# Patient Record
Sex: Female | Born: 1955
Health system: Southern US, Community
[De-identification: ages and names within clinical notes are randomized; demographics above are authoritative.]

## PROBLEM LIST (undated history)

## (undated) DIAGNOSIS — I10 Essential (primary) hypertension: Secondary | ICD-10-CM

## (undated) DIAGNOSIS — I739 Peripheral vascular disease, unspecified: Secondary | ICD-10-CM

## (undated) DIAGNOSIS — K219 Gastro-esophageal reflux disease without esophagitis: Secondary | ICD-10-CM

## (undated) HISTORY — PX: BACK SURGERY: SHX140

## (undated) HISTORY — PX: BREAST EXCISIONAL BIOPSY: SUR124

---

## 2000-04-04 ENCOUNTER — Inpatient Hospital Stay (HOSPITAL_COMMUNITY): Admission: EM | Admit: 2000-04-04 | Discharge: 2000-04-06 | Payer: Self-pay | Admitting: Psychiatry

## 2000-10-17 ENCOUNTER — Emergency Department (HOSPITAL_COMMUNITY): Admission: EM | Admit: 2000-10-17 | Discharge: 2000-10-17 | Payer: Self-pay | Admitting: Emergency Medicine

## 2001-05-06 ENCOUNTER — Emergency Department (HOSPITAL_COMMUNITY): Admission: EM | Admit: 2001-05-06 | Discharge: 2001-05-06 | Payer: Self-pay | Admitting: Emergency Medicine

## 2001-05-06 ENCOUNTER — Encounter: Payer: Self-pay | Admitting: Emergency Medicine

## 2008-02-11 ENCOUNTER — Emergency Department (HOSPITAL_COMMUNITY): Admission: EM | Admit: 2008-02-11 | Discharge: 2008-02-11 | Payer: Self-pay | Admitting: Emergency Medicine

## 2008-03-19 ENCOUNTER — Emergency Department (HOSPITAL_BASED_OUTPATIENT_CLINIC_OR_DEPARTMENT_OTHER): Admission: EM | Admit: 2008-03-19 | Discharge: 2008-03-19 | Payer: Self-pay | Admitting: Emergency Medicine

## 2008-04-12 ENCOUNTER — Ambulatory Visit: Payer: Self-pay | Admitting: Diagnostic Radiology

## 2008-04-12 ENCOUNTER — Emergency Department (HOSPITAL_BASED_OUTPATIENT_CLINIC_OR_DEPARTMENT_OTHER): Admission: EM | Admit: 2008-04-12 | Discharge: 2008-04-12 | Payer: Self-pay | Admitting: Emergency Medicine

## 2008-05-12 ENCOUNTER — Ambulatory Visit: Payer: Self-pay | Admitting: Diagnostic Radiology

## 2008-05-12 ENCOUNTER — Emergency Department (HOSPITAL_BASED_OUTPATIENT_CLINIC_OR_DEPARTMENT_OTHER): Admission: EM | Admit: 2008-05-12 | Discharge: 2008-05-12 | Payer: Self-pay | Admitting: Emergency Medicine

## 2008-09-28 ENCOUNTER — Emergency Department (HOSPITAL_BASED_OUTPATIENT_CLINIC_OR_DEPARTMENT_OTHER): Admission: EM | Admit: 2008-09-28 | Discharge: 2008-09-28 | Payer: Self-pay | Admitting: Emergency Medicine

## 2010-08-05 NOTE — Discharge Summary (Signed)
[facility]  Patient:    Cynthia Hobbs, Cynthia Hobbs                         MRN: [id number] Adm. Date:  [id number] Disc. Date: [id number] Attending:  Marlyn Corporal Fabmy Dictator:   Landry Corporal, N.P.                           Discharge Summary  HISTORY OF PRESENT ILLNESS:  This is a 55 year old married white female, voluntarily admitted to [facility] for psychotic behavior.  Patient presented with auditory and visual hallucinations since day before admission. Patient had taken herself off her Fioricet and Xanax that she had been on for 3 years.  Patient states that she was just tired of taking them.  She states that her husband was telling her that she had been up all night, talking to herself, having irrational behavior, seeing things that were not there. Patient does not really recall if she were sleeping or not.  She states that that was the case, that she had been sleeping poorly, and has actually been up for the past 4 nights.  She was also ill the week prior to admission, having some vomiting and diarrhea.  She reports some anxiety, having some twitchiness especially in her upper extremities.  Her last pain pill and Xanax was the week prior to admission, approximately 7 days ago.  Patient has continued to have auditory and visual hallucinations as well as tactile hallucinations. She feels that someone was doing something to her head last night, doing some kind of procedure.  She denies any homicidal ideation or paranoia.  She does report having some racing thoughts, no mood swings.  Patient has had no inpatient hospitalizations, nor has she had any outpatient therapy.  Patient has been on Serzone 100 mg p.o. b.i.d., Xanax 0.5 mg 2-3 per night for years that she uses for sleep.  PAST MEDICAL HISTORY:  Her primary care physician is Dr. Tiburcio Pea in [place]. Her medical problems:  Back surgery 3 years ago, otherwise no significant problems.  Admission  medications:  Patient has been on Fioricet, Seroquel 100 mg b.i.d.  She has found that to be effective for her depression, but not for sleep, and takes Xanax 2-3 at night, 0.5 mg.  Drug allergies:  No known drug allergies.  LABORATORY DATA:  WBC count was elevated at 17.3, H&H was elevated at 16.0, 51.0, MCVs were 104.5, glucose was 116, BUN was 27, urine pregnancy test was negative, urine drug screen was negative.  Patient had average weight at 134 with her height of 62.5 inches.  MENTAL STATUS EXAMINATION:  Patient is an alert young middle-aged white female.  She is cooperative, casually dressed, good eye contact, and pleasant. Her arms are twitching on occasion during the interview.  Her speech is normal and relevant.  Her mood is pleasant, her affect is appropriate.  Thought processes:  Patient is experiencing positive visual hallucinations.  She is seeing haze and smoke, and tactile hallucinations.  She has felt that people were doing some exam on her head during the night last night after admission. She denies any paranoia or suicidal or homicidal ideations, no flight of ideas.  She is having some racing thoughts.  Cognitively, patient is aware of date and person.  She does feel a little bit confused.  She is unsure if she has slept and talking in her sleep.  Her memory is fair.  ADMISSION DIAGNOSES: Axis I:    1. Substance-abuse induced mood disorder due to opiates and               benzodiazepines.            2. Rule out bipolar. Axis II:   Deferred. Axis III:  Back pain. Axis IV:   Mild, psychosocial stressors. Axis V:    Current is 30, this past year is 70.  HOSPITAL COURSE:  This is a voluntary admission for psychotic behavior. Patient is to contract for safety, will be monitored every 15 minutes.  We will initiate Zyprexa during the daytime and nighttime hours and begin Depakote ER at bedtime.  Dr. Jodi Marble was consulted for medication regime.  Has had a very short  hospital stay.  Patient was taking Fioricet and Xanax for two years and then abruptly stopped her medication, as well as the Serzone.  We suspect that she had some delirium that has cleared.  Depakote was discontinued and it was felt that patient could be managed on an outpatient basis.  Patient was to follow up with Dr. Tiburcio Pea for any problems and call the office for any kind of crisis.  She also was to call Dr. Jodi Marble for any problems, with the phone number provided.  DISCHARGE MEDICATIONS:  Zyprexa 2.5 mg 1 q.h.s. as needed.  There were no other restrictions for activity or diet.  DISCHARGE DIAGNOSES: Axis I:     Substance-induced mood disorder due to opiates and             benzodiazepines. Axis II:    Deferred. Axis III:   Back pain. Axis IV:    Mild psychosocial stressors. Axis V:     Current is 60, the past year is 70. DD:  05/02/00 TD:  05/03/00 Job: [id number]

## 2010-08-05 NOTE — Discharge Summary (Signed)
[facility]  Patient:    Cynthia Hobbs, Cynthia Hobbs                         MRN: [id number] Adm. Date:  [id number] Disc. Date: [id number] Attending:  Marlyn Corporal Fabmy Dictator:   Candi Leash. Orsini, N.P.                           Discharge Summary  HISTORY OF PRESENT ILLNESS:  This is a 55 year old married white female voluntarily admitted for psychotic behavior. The patient presented with auditory visual hallucinations.  Two days  prior to admission she had taken herself off her Fioricet and Xanax that she had been taking for the past three years.  The patient states she was just tired of taking them.  Her husband told that she had been up all night, talking to herself, having irrational behavior, seeing things that were not there. The patient does not really recall if she was sleeping or not.  She does state that that was the case, that she has been sleeping poorly and has had no sleep for the past four nights.  The patient has also been ill... DD:  05/02/00 TD:  05/03/00 Job: [id number]

## 2010-08-05 NOTE — H&P (Signed)
[facility]  Patient:    Cynthia Hobbs, Cynthia Hobbs                         MRN: [id number] Adm. Date:  [id number] Attending:  Marlyn Corporal Fabmy Dictator:   Landry Corporal, N.P.                   Psychiatric Admission Assessment  DATE OF ADMISSION:  April 04, 2000  PATIENT IDENTIFICATION:  This is a 55 year old married white female, voluntarily admitted to [facility] on April 04, 2000, for psychotic behavior.  HISTORY OF PRESENT ILLNESS:  Patient presents with auditory and visual hallucinations since Monday, April 02, 2000.  Patient reports that she had taken herself off her Fioricet and Xanax that she has been on for the past 3 years.  She states that she was just tired of being on them.  Her husband had told her that she has been up all night, talking to herself, having irrational behavior, seeing things that were not there.  Patient does not really recall if she was sleeping or not.  She does that that was the case, that she has been sleeping very poorly and has had no sleep for the past 4 nights.   She also was ill last week, having some vomiting and diarrhea.  She reports some anxiety.  She states that she has been having some twitching, especially in her upper extremities.  Her last pain pill and Xanax was last week, approximately 7 days ago.  Patient continues to have auditory and visual hallucinations, had some tactile hallucinations.  She feels that someone was doing something to her head last night, doing some kind of procedure.  She denies any homicidal ideation, no paranoia.  She does report having some racing thoughts, no mood swings.  PAST PSYCHIATRIC HISTORY:  She has had no inpatient hospitalizations.  She does not see a counselor or therapist.  She has been on Serzone 100 mg p.o. b.i.d. and Xanax 0.5 mg 2-3 per night for years for sleep.  SUBSTANCE ABUSE HISTORY:  She smokes 2 packs a day for 15 years, denies any alcohol or  substance abuse problems.  PAST MEDICAL HISTORY:  Primary care physician is Dr. Tiburcio Pea in [place]. Medical problems:  Back surgery 3 years ago, otherwise no significant medical problems.  Medications:  She has been on Fioricet, Seroquel 100 mg b.i.d.  She has found that effective for her depression but not to help her with her sleep, and she takes Xanax 2-3 at night, 0.5 mg for years for sleep that were prescribed by Dr. Tiburcio Pea.  DRUG ALLERGIES:  No known drug allergies.  PHYSICAL EXAMINATION:  Pending.  We are awaiting results on a UA, urine drug screen, urine pregnancy test, CBC and CMET.  SOCIAL HISTORY:  She is a 55 year old married white female for 14 years.  She has two children, one five and one 38 year old.  She lives with her husband and children.  She works as a Child psychotherapist.  She has completed the 10th grade. She has no financial or legal problems.  FAMILY HISTORY:  She has a grandfather with depression.  MENTAL STATUS EXAMINATION:  She is an alert, young middle aged white female. She is cooperative, casually dressed, good eye contact, pleasant.  Her arms are twitching on occasion during the interview.  Her speech is normal and relevant.  Her mood is pleasant.  Her affect is appropriate.  Thought  processes:  Patient is experiencing positive visual hallucinations.  She is seeing haze and smoke, and tactile hallucinations.  She has felt that people were doing some exam on her head during the night last night after admission. She denies any paranoia.  No suicidal or homicidal ideations.  No flight of ideas.  She is having some racing thoughts at night.  Cognitively, patient is aware of dates and person.  She does feel a little bit confused.  She is unsure if she has slept and talking in her sleep.  Her memory is fair.  ADMISSION DIAGNOSES: Axis I:    1. Substance abuse-induced mood disorder due to opiates and               benzodiazepines.            2. Rule out  bipolar. Axis II:   Deferred. Axis III:  Back pain. Axis IV:   Mild psychosocial stressors. Axis V:    Current is 30, this past year 78.  INITIAL PLAN OF CARE:  Voluntary admission to [facility] for psychotic behavior, contract for safety, check every 15 minutes.  Will initiate Zyprexa during daytime and nighttime hours and Depakote ER 500 mg q.h.s.  We are awaiting laboratory results on UA, urine pregnancy test, urine drug screen, CMET, CBC and thyroid.  Dr. Jodi Marble was consulted for medication regime.  ESTIMATED LENGTH OF STAY:  Four to five days. DD:  04/05/00 TD:  04/05/00 Job: [id number]

## 2011-04-10 ENCOUNTER — Other Ambulatory Visit: Payer: Self-pay | Admitting: Family

## 2011-04-10 DIAGNOSIS — Z1231 Encounter for screening mammogram for malignant neoplasm of breast: Secondary | ICD-10-CM

## 2011-04-26 ENCOUNTER — Ambulatory Visit
Admission: RE | Admit: 2011-04-26 | Discharge: 2011-04-26 | Disposition: A | Discharge status: Home / Self Care | Payer: Self-pay | Source: Ambulatory Visit | Attending: Family | Admitting: Family

## 2011-04-26 DIAGNOSIS — Z1231 Encounter for screening mammogram for malignant neoplasm of breast: Secondary | ICD-10-CM

## 2012-06-15 ENCOUNTER — Emergency Department (HOSPITAL_COMMUNITY)
Admission: EM | Admit: 2012-06-15 | Discharge: 2012-06-15 | Disposition: A | Discharge status: Home / Self Care | Payer: Self-pay | Attending: Emergency Medicine | Admitting: Emergency Medicine

## 2012-06-15 ENCOUNTER — Encounter (HOSPITAL_COMMUNITY): Payer: Self-pay | Admitting: Cardiology

## 2012-06-15 DIAGNOSIS — R220 Localized swelling, mass and lump, head: Secondary | ICD-10-CM | POA: Insufficient documentation

## 2012-06-15 DIAGNOSIS — R221 Localized swelling, mass and lump, neck: Secondary | ICD-10-CM | POA: Insufficient documentation

## 2012-06-15 DIAGNOSIS — Z8719 Personal history of other diseases of the digestive system: Secondary | ICD-10-CM | POA: Insufficient documentation

## 2012-06-15 DIAGNOSIS — F17200 Nicotine dependence, unspecified, uncomplicated: Secondary | ICD-10-CM | POA: Insufficient documentation

## 2012-06-15 HISTORY — DX: Gastro-esophageal reflux disease without esophagitis: K21.9

## 2012-06-15 MED ORDER — TRAMADOL HCL 50 MG PO TABS
50.0000 mg | ORAL_TABLET | Freq: Once | ORAL | Status: AC
  Administered 2012-06-15: 50 mg via ORAL
  Filled 2012-06-15: qty 1

## 2012-06-15 MED ORDER — TRAMADOL HCL 50 MG PO TABS: 50.0000 mg | ORAL_TABLET | Freq: Four times a day (QID) | ORAL | Status: DC | PRN

## 2012-06-15 MED ORDER — METRONIDAZOLE 500 MG PO TABS: 500.0000 mg | ORAL_TABLET | Freq: Two times a day (BID) | ORAL | Status: DC

## 2012-06-15 MED ORDER — METRONIDAZOLE 500 MG PO TABS
500.0000 mg | ORAL_TABLET | Freq: Once | ORAL | Status: AC
  Administered 2012-06-15: 500 mg via ORAL
  Filled 2012-06-15: qty 1

## 2012-06-15 MED ORDER — CEFTRIAXONE SODIUM 1 G IJ SOLR
1.0000 g | Freq: Once | INTRAMUSCULAR | Status: AC
  Administered 2012-06-15: 1 g via INTRAMUSCULAR
  Filled 2012-06-15: qty 10

## 2012-06-15 MED ORDER — PENICILLIN V POTASSIUM 500 MG PO TABS: 500.0000 mg | ORAL_TABLET | Freq: Four times a day (QID) | ORAL | Status: DC

## 2012-06-15 NOTE — ED Provider Notes (Signed)
History     CSN: [id number]  Arrival date & time 06/15/12  1145   First MD Initiated Contact with Patient 06/15/12 1202      Chief Complaint  Patient presents with  . Facial Pain    (Consider location/radiation/quality/duration/timing/severity/associated sxs/prior treatment) HPI  Cynthia Hobbs is a 57 y.o. female complaining of pain and swelling to left cheek starting approximately 24 hours ago. Patient has a scratch just superior to the affected area. She's also been having tooth pain in the maxillary teeth just beneath the swelling. She denies fever, difficulty swallowing her secretions, shortness of breath, change in her vision. Pain is rated as moderate and is exacerbated by jaw movement and palpation.  Past Medical History  Diagnosis Date  . GERD (gastroesophageal reflux disease)     Past Surgical History  Procedure Laterality Date  . Back surgery      History reviewed. No pertinent family history.  History  Substance Use Topics  . Smoking status: Current Every Day Smoker  . Smokeless tobacco: Not on file  . Alcohol Use: No    OB History   Grav Para Term Preterm Abortions TAB SAB Ect Mult Living                  Review of Systems  Allergies  Review of patient's allergies indicates no known allergies.  Home Medications   Current Outpatient Rx  Name  Route  Sig  Dispense  Refill  . naproxen sodium (ANAPROX) 220 MG tablet   Oral   Take 440 mg by mouth 2 (two) times daily with a meal.         . OVER THE COUNTER MEDICATION   Oral   Take 1 capsule by mouth daily. Equate brand of Prilosec           BP 157/93  Pulse 76  Temp(Src) 98.7 F (37.1 C) (Oral)  Resp 18  SpO2 97%  Physical Exam  Nursing note and vitals reviewed. Constitutional: She is oriented to person, place, and time. She appears well-developed and well-nourished. No distress.  HENT:  Head: Normocephalic.    Right Ear: External ear normal.  Left Ear: External ear normal.    Mouth/Throat: Oropharynx is clear and moist.    Generally good dentition, tenderness to palpation in area indicated on diagram. No swelling, erythema or fluctuance appreciated.  No firmness or tenderness to palpation under tongue  Eyes: Conjunctivae and EOM are normal. Pupils are equal, round, and reactive to light.  Neck: Normal range of motion. Neck supple.  Cardiovascular: Normal rate.   Pulmonary/Chest: Effort normal. No stridor.  Musculoskeletal: Normal range of motion.  Lymphadenopathy:    She has cervical adenopathy.  Neurological: She is alert and oriented to person, place, and time.  Psychiatric: She has a normal mood and affect.    ED Course  Procedures (including critical care time)  Labs Reviewed - No data to display No results found.   1. Left facial swelling       MDM    Cynthia Hobbs is a 57 y.o. female with moderate swelling to the right cheek. This does not appear to be a cellulitis associated with the scab directly above the swelling, however with dental pain onset was after the swelling occurred I will treat her for soft tissue infection and also cover for dental infection as well.  Strict return precautions were given and patient verbalized her understanding.   Filed Vitals:   06/15/12 1151  BP: 157/93  Pulse: 76  Temp: 98.7 F (37.1 C)  TempSrc: Oral  Resp: 18  SpO2: 97%     Pt verbalized understanding and agrees with care plan. Outpatient follow-up and return precautions given.    New Prescriptions   METRONIDAZOLE (FLAGYL) 500 MG TABLET    Take 1 tablet (500 mg total) by mouth 2 (two) times daily. One tab PO bid x 10 days   PENICILLIN V POTASSIUM (VEETID) 500 MG TABLET    Take 1 tablet (500 mg total) by mouth 4 (four) times daily.   TRAMADOL (ULTRAM) 50 MG TABLET    Take 1 tablet (50 mg total) by mouth every 6 (six) hours as needed for pain.           Wynetta Emery, PA-C 06/15/12 1255

## 2012-06-15 NOTE — ED Notes (Signed)
Pt reports some swelling and pain to the left side of her upper jaw near her nose. States she has not noticed and sore or problems with her tooth but it is painful to bite down. States increased stress recently also.

## 2012-06-16 NOTE — ED Provider Notes (Signed)
Medical screening examination/treatment/procedure(s) were performed by non-physician practitioner and as supervising physician I was immediately available for consultation/collaboration.    Vida Roller, MD 06/16/12 [phone]

## 2012-08-14 ENCOUNTER — Other Ambulatory Visit: Payer: Self-pay

## 2012-08-14 DIAGNOSIS — Z1231 Encounter for screening mammogram for malignant neoplasm of breast: Secondary | ICD-10-CM

## 2012-09-16 ENCOUNTER — Ambulatory Visit
Admission: RE | Admit: 2012-09-16 | Discharge: 2012-09-16 | Disposition: A | Discharge status: Home / Self Care | Payer: BC Managed Care – PPO | Source: Ambulatory Visit

## 2012-09-16 DIAGNOSIS — Z1231 Encounter for screening mammogram for malignant neoplasm of breast: Secondary | ICD-10-CM

## 2012-09-19 ENCOUNTER — Other Ambulatory Visit: Payer: Self-pay | Admitting: Family

## 2012-09-19 DIAGNOSIS — R928 Other abnormal and inconclusive findings on diagnostic imaging of breast: Secondary | ICD-10-CM

## 2012-10-02 ENCOUNTER — Ambulatory Visit
Admission: RE | Admit: 2012-10-02 | Discharge: 2012-10-02 | Disposition: A | Discharge status: Home / Self Care | Payer: BC Managed Care – PPO | Source: Ambulatory Visit | Attending: Family | Admitting: Family

## 2012-10-02 DIAGNOSIS — R928 Other abnormal and inconclusive findings on diagnostic imaging of breast: Secondary | ICD-10-CM

## 2013-11-25 ENCOUNTER — Other Ambulatory Visit: Payer: Self-pay

## 2013-11-25 DIAGNOSIS — Z1231 Encounter for screening mammogram for malignant neoplasm of breast: Secondary | ICD-10-CM

## 2013-12-03 ENCOUNTER — Ambulatory Visit
Admission: RE | Admit: 2013-12-03 | Discharge: 2013-12-03 | Disposition: A | Discharge status: Home / Self Care | Payer: BC Managed Care – PPO | Source: Ambulatory Visit

## 2013-12-03 DIAGNOSIS — Z1231 Encounter for screening mammogram for malignant neoplasm of breast: Secondary | ICD-10-CM

## 2014-12-02 ENCOUNTER — Other Ambulatory Visit: Payer: Self-pay

## 2014-12-02 DIAGNOSIS — Z1231 Encounter for screening mammogram for malignant neoplasm of breast: Secondary | ICD-10-CM

## 2015-01-06 ENCOUNTER — Ambulatory Visit
Admission: RE | Admit: 2015-01-06 | Discharge: 2015-01-06 | Disposition: A | Discharge status: Home / Self Care | Payer: 59 | Source: Ambulatory Visit

## 2015-01-06 DIAGNOSIS — Z1231 Encounter for screening mammogram for malignant neoplasm of breast: Secondary | ICD-10-CM

## 2016-01-26 ENCOUNTER — Other Ambulatory Visit: Payer: Self-pay | Admitting: Family

## 2016-01-26 DIAGNOSIS — Z1231 Encounter for screening mammogram for malignant neoplasm of breast: Secondary | ICD-10-CM

## 2016-03-01 ENCOUNTER — Ambulatory Visit
Admission: RE | Admit: 2016-03-01 | Discharge: 2016-03-01 | Disposition: A | Discharge status: Home / Self Care | Payer: Medicaid Other | Source: Ambulatory Visit | Attending: Family | Admitting: Family

## 2016-03-01 DIAGNOSIS — Z1231 Encounter for screening mammogram for malignant neoplasm of breast: Secondary | ICD-10-CM

## 2017-01-03 ENCOUNTER — Ambulatory Visit (HOSPITAL_COMMUNITY)
Admission: EM | Admit: 2017-01-03 | Discharge: 2017-01-03 | Disposition: A | Discharge status: Home / Self Care | Payer: BLUE CROSS/BLUE SHIELD | Attending: Family Medicine | Admitting: Family Medicine

## 2017-01-03 ENCOUNTER — Encounter (HOSPITAL_COMMUNITY): Payer: Self-pay | Admitting: Emergency Medicine

## 2017-01-03 DIAGNOSIS — I10 Essential (primary) hypertension: Secondary | ICD-10-CM | POA: Diagnosis not present

## 2017-01-03 MED ORDER — LISINOPRIL-HYDROCHLOROTHIAZIDE 10-12.5 MG PO TABS: 1.0000 | ORAL_TABLET | Freq: Every day | ORAL | 1 refills | Status: DC

## 2017-01-03 NOTE — ED Provider Notes (Signed)
  [facility]   [id number] 01/03/17 Arrival Time: 1433  ASSESSMENT & PLAN:  1. Essential hypertension     Meds ordered this encounter  Medications  . lisinopril-hydrochlorothiazide (PRINZIDE,ZESTORETIC) 10-12.5 MG tablet    Sig: Take 1 tablet by mouth daily.    Dispense:  30 tablet    Refill:  1   Has f/u with PCP in 2 weeks. May f/u here as needed.  Reviewed expectations re: course of current medical issues. Questions answered. Outlined signs and symptoms indicating need for more acute intervention. Patient verbalized understanding. After Visit Summary given.   SUBJECTIVE:  Cynthia Hobbs is a 61 y.o. female who presents with complaint of noticing high blood pressures the past few weeks. Systolics in the 170-180s. No h/o HTN. No symptoms. FH of HTN. Desires to begin medication.  ROS: As per HPI.   OBJECTIVE:  Vitals:   01/03/17 1508  BP: (!) 170/104  Pulse: 77  Resp: 16  Temp: 98.5 F (36.9 C)  TempSrc: Oral  SpO2: 100%    General appearance: alert; no distress Eyes: PERRLA; EOMI; conjunctiva normal Neck: supple Lungs: clear to auscultation bilaterally Heart: regular rate and rhythm Abdomen: soft, non-tender; bowel sounds normal; no masses or organomegaly; no guarding or rebound tenderness Extremities: no cyanosis or edema; symmetrical with no gross deformities Skin: warm and dry Neurologic: normal gait; normal symmetric reflexes Psychological: alert and cooperative; normal mood and affect   No Known Allergies  Past Medical History:  Diagnosis Date  . GERD (gastroesophageal reflux disease)    Social History   Social History  . Marital status: Single    Spouse name: N/A  . Number of children: N/A  . Years of education: N/A   Occupational History  . Not on file.   Social History Main Topics  . Smoking status: Current Every Day Smoker  . Smokeless tobacco: Not on file  . Alcohol use No  . Drug use: No  . Sexual activity: Not on file     Other Topics Concern  . Not on file   Social History Narrative  . No narrative on file   No family history on file. Past Surgical History:  Procedure Laterality Date  . BACK SURGERY       Mardella LaymanHagler, Lailee Hoelzel, MD 01/08/17 [phone]

## 2017-01-03 NOTE — ED Triage Notes (Signed)
Pt states, "i haven't been to the doctor in a while, and I was at the doctors office two weeks ago and it was 180, and i've been checking it for the past few weeks and its always been high around 170s 180s. I dont have a history of high blood pressure and im not having any symptoms right now, sometimes i've had a headache and I take an advil and it goes away. I dont have a headache now."

## 2017-01-03 NOTE — ED Notes (Signed)
Pt now states been taking her mothers blood pressure medicines. Metoprolol 25mg  once a day x2 weeks.

## 2017-01-17 DIAGNOSIS — Z1151 Encounter for screening for human papillomavirus (HPV): Secondary | ICD-10-CM | POA: Diagnosis not present

## 2017-01-17 DIAGNOSIS — R5383 Other fatigue: Secondary | ICD-10-CM | POA: Diagnosis not present

## 2017-01-17 DIAGNOSIS — I10 Essential (primary) hypertension: Secondary | ICD-10-CM | POA: Diagnosis not present

## 2017-01-17 DIAGNOSIS — Z1272 Encounter for screening for malignant neoplasm of vagina: Secondary | ICD-10-CM | POA: Diagnosis not present

## 2017-01-17 DIAGNOSIS — E663 Overweight: Secondary | ICD-10-CM | POA: Diagnosis not present

## 2017-01-17 DIAGNOSIS — Z0000 Encounter for general adult medical examination without abnormal findings: Secondary | ICD-10-CM | POA: Diagnosis not present

## 2017-01-17 DIAGNOSIS — N390 Urinary tract infection, site not specified: Secondary | ICD-10-CM | POA: Diagnosis not present

## 2017-01-17 DIAGNOSIS — Z01419 Encounter for gynecological examination (general) (routine) without abnormal findings: Secondary | ICD-10-CM | POA: Diagnosis not present

## 2017-02-14 DIAGNOSIS — R319 Hematuria, unspecified: Secondary | ICD-10-CM | POA: Diagnosis not present

## 2017-02-14 DIAGNOSIS — N390 Urinary tract infection, site not specified: Secondary | ICD-10-CM | POA: Diagnosis not present

## 2017-02-14 DIAGNOSIS — E559 Vitamin D deficiency, unspecified: Secondary | ICD-10-CM | POA: Diagnosis not present

## 2017-02-14 DIAGNOSIS — I10 Essential (primary) hypertension: Secondary | ICD-10-CM | POA: Diagnosis not present

## 2017-03-07 DIAGNOSIS — Z1211 Encounter for screening for malignant neoplasm of colon: Secondary | ICD-10-CM | POA: Diagnosis not present

## 2017-03-07 DIAGNOSIS — K621 Rectal polyp: Secondary | ICD-10-CM | POA: Diagnosis not present

## 2017-03-16 DIAGNOSIS — K621 Rectal polyp: Secondary | ICD-10-CM | POA: Diagnosis not present

## 2017-03-16 DIAGNOSIS — Z1211 Encounter for screening for malignant neoplasm of colon: Secondary | ICD-10-CM | POA: Diagnosis not present

## 2017-03-21 DIAGNOSIS — E559 Vitamin D deficiency, unspecified: Secondary | ICD-10-CM | POA: Diagnosis not present

## 2017-04-04 ENCOUNTER — Other Ambulatory Visit: Payer: Self-pay | Admitting: Internal Medicine

## 2017-04-04 DIAGNOSIS — Z139 Encounter for screening, unspecified: Secondary | ICD-10-CM

## 2017-04-25 ENCOUNTER — Ambulatory Visit
Admission: RE | Admit: 2017-04-25 | Discharge: 2017-04-25 | Disposition: A | Discharge status: Home / Self Care | Payer: BLUE CROSS/BLUE SHIELD | Source: Ambulatory Visit | Attending: Internal Medicine | Admitting: Internal Medicine

## 2017-04-25 DIAGNOSIS — Z1231 Encounter for screening mammogram for malignant neoplasm of breast: Secondary | ICD-10-CM | POA: Diagnosis not present

## 2017-04-25 DIAGNOSIS — Z139 Encounter for screening, unspecified: Secondary | ICD-10-CM

## 2017-05-16 DIAGNOSIS — E559 Vitamin D deficiency, unspecified: Secondary | ICD-10-CM | POA: Diagnosis not present

## 2017-06-20 DIAGNOSIS — D485 Neoplasm of uncertain behavior of skin: Secondary | ICD-10-CM | POA: Diagnosis not present

## 2017-06-20 DIAGNOSIS — C44319 Basal cell carcinoma of skin of other parts of face: Secondary | ICD-10-CM | POA: Diagnosis not present

## 2017-08-15 DIAGNOSIS — I10 Essential (primary) hypertension: Secondary | ICD-10-CM | POA: Diagnosis not present

## 2017-08-28 DIAGNOSIS — C44319 Basal cell carcinoma of skin of other parts of face: Secondary | ICD-10-CM | POA: Diagnosis not present

## 2018-01-16 DIAGNOSIS — Z0000 Encounter for general adult medical examination without abnormal findings: Secondary | ICD-10-CM | POA: Diagnosis not present

## 2018-01-16 DIAGNOSIS — I10 Essential (primary) hypertension: Secondary | ICD-10-CM | POA: Diagnosis not present

## 2018-01-16 DIAGNOSIS — E559 Vitamin D deficiency, unspecified: Secondary | ICD-10-CM | POA: Diagnosis not present

## 2018-01-23 DIAGNOSIS — Z23 Encounter for immunization: Secondary | ICD-10-CM | POA: Diagnosis not present

## 2018-01-23 DIAGNOSIS — Z0000 Encounter for general adult medical examination without abnormal findings: Secondary | ICD-10-CM | POA: Diagnosis not present

## 2018-01-28 ENCOUNTER — Other Ambulatory Visit: Payer: Self-pay | Admitting: Internal Medicine

## 2018-01-28 DIAGNOSIS — Z720 Tobacco use: Secondary | ICD-10-CM

## 2018-02-06 ENCOUNTER — Ambulatory Visit
Admission: RE | Admit: 2018-02-06 | Discharge: 2018-02-06 | Disposition: A | Discharge status: Home / Self Care | Payer: BLUE CROSS/BLUE SHIELD | Source: Ambulatory Visit | Attending: Internal Medicine | Admitting: Internal Medicine

## 2018-02-06 DIAGNOSIS — F17210 Nicotine dependence, cigarettes, uncomplicated: Secondary | ICD-10-CM | POA: Diagnosis not present

## 2018-02-06 DIAGNOSIS — Z720 Tobacco use: Secondary | ICD-10-CM

## 2018-02-20 DIAGNOSIS — Z720 Tobacco use: Secondary | ICD-10-CM | POA: Diagnosis not present

## 2018-02-20 DIAGNOSIS — I25111 Atherosclerotic heart disease of native coronary artery with angina pectoris with documented spasm: Secondary | ICD-10-CM | POA: Diagnosis not present

## 2018-02-20 DIAGNOSIS — I700 Atherosclerosis of aorta: Secondary | ICD-10-CM | POA: Diagnosis not present

## 2018-03-06 ENCOUNTER — Encounter: Payer: Self-pay | Admitting: Cardiovascular Disease

## 2018-03-06 ENCOUNTER — Ambulatory Visit: Payer: BLUE CROSS/BLUE SHIELD | Admitting: Cardiovascular Disease

## 2018-03-06 VITALS — BP 118/70 | HR 72 | Ht 62.0 in | Wt 142.0 lb

## 2018-03-06 DIAGNOSIS — I2510 Atherosclerotic heart disease of native coronary artery without angina pectoris: Secondary | ICD-10-CM

## 2018-03-06 DIAGNOSIS — I739 Peripheral vascular disease, unspecified: Secondary | ICD-10-CM

## 2018-03-06 DIAGNOSIS — E785 Hyperlipidemia, unspecified: Secondary | ICD-10-CM | POA: Diagnosis not present

## 2018-03-06 DIAGNOSIS — I10 Essential (primary) hypertension: Secondary | ICD-10-CM | POA: Insufficient documentation

## 2018-03-06 DIAGNOSIS — Z720 Tobacco use: Secondary | ICD-10-CM | POA: Insufficient documentation

## 2018-03-06 NOTE — Assessment & Plan Note (Signed)
History of coronary calcification seen on screening chest CT 02/06/2018.  She does have positive risk factors otherwise.  We will get a pharmacologic Myoview stress test to further evaluate.

## 2018-03-06 NOTE — Patient Instructions (Signed)
Medication Instructions:  Your physician recommends that you continue on your current medications as directed. Please refer to the Current Medication list given to you today.  If you need a refill on your cardiac medications before your next appointment, please call your pharmacy.   Lab work: Your physician recommends that you return for lab work in: 2 MONTHS; LIPID/ LIVER  If you have labs (blood work) drawn today and your tests are completely normal, you will receive your results only by: Marland Kitchen. MyChart Message (if you have MyChart) OR . A paper copy in the mail If you have any lab test that is abnormal or we need to change your treatment, we will call you to review the results.  Testing/Procedures: Your physician has requested that you have a lexiscan myoview. For further information please visit [email or web address]. Please follow instruction sheet, as given.  Your physician has requested that you have a lower or upper extremity arterial duplex. This test is an ultrasound of the arteries in the legs or arms. It looks at arterial blood flow in the legs and arms. Allow one hour for Lower and Upper Arterial scans. There are no restrictions or special instructions  Your physician has requested that you have an ankle brachial index (ABI). During this test an ultrasound and blood pressure cuff are used to evaluate the arteries that supply the arms and legs with blood. Allow thirty minutes for this exam. There are no restrictions or special instructions.    Follow-Up: At [facility], you and your health needs are our priority.  As part of our continuing mission to provide you with exceptional heart care, we have created designated Provider Care Teams.  These Care Teams include your primary Cardiologist (physician) and Advanced Practice Providers (APPs -  Physician Assistants and Nurse Practitioners) who all work together to provide you with the care you need, when you need it. You will need a  follow up appointment in 6 months.  Please call our office 2 months in advance to schedule this appointment.  You may see DR. BERRY or one of the following Advanced Practice Providers on your designated Care Team:   Corine ShelterLuke Kilroy, PA-C [place]a Kroeger, [place] JerseyPA-C . Marjie Skiffallie Goodrich, PA-C

## 2018-03-06 NOTE — Assessment & Plan Note (Signed)
History of hyperlipidemia recently started on statin therapy.  We will recheck a lipid liver profile in 3 months

## 2018-03-06 NOTE — Assessment & Plan Note (Signed)
History of right lower extremity claudication.  We will get aortoiliac and lower extremity arterial Doppler studies to further evaluate

## 2018-03-06 NOTE — Assessment & Plan Note (Signed)
History of essential hypertension on amlodipine with blood pressure measured today at 118/70.

## 2018-03-06 NOTE — Progress Notes (Signed)
03/06/2018 Cynthia Hobbs   December 22, 1955  [id number]  Primary Physician Merri Brunette, MD Primary Cardiologist: Runell Gess MD Nicholes Calamity, [place]  HPI:  Cynthia Hobbs is a 62 y.o. thin appearing divorced Caucasian female mother of 2, grandmother of 2 grandchildren who is accompanied by her mother [name] who is also a patient of mine.  She was referred by Dr. Merri Brunette for cardiac evaluation because of coronary calcification found on screening chest CT.  Risk factors include 40 pack years of tobacco use, treated hypertension and hyperlipidemia.  Mother did have carotid artery disease with endarterectomy recently.  She is never had a heart attack or stroke.  She denies chest pain or shortness of breath patient does complain of right lower extremity claudication however.   Current Meds  Medication Sig  . amLODipine (NORVASC) 5 MG tablet Take 5 mg by mouth daily.  . Cholecalciferol (VITAMIN D3) 50 MCG (2000 UT) TABS Take 2,000 Units by mouth daily.  . Ibuprofen (ADVIL PO) Take by mouth.  Marland Kitchen OVER THE COUNTER MEDICATION Take 1 capsule by mouth daily. Equate brand of Prilosec  . rosuvastatin (CRESTOR) 10 MG tablet Take 10 mg by mouth daily.     No Known Allergies  Social History   Socioeconomic History  . Marital status: Single    Spouse name: Not on file  . Number of children: Not on file  . Years of education: Not on file  . Highest education level: Not on file  Occupational History  . Not on file  Social Needs  . Financial resource strain: Not on file  . Food insecurity:    Worry: Not on file    Inability: Not on file  . Transportation needs:    Medical: Not on file    Non-medical: Not on file  Tobacco Use  . Smoking status: Current Every Day Smoker  . Smokeless tobacco: Never Used  Substance and Sexual Activity  . Alcohol use: No  . Drug use: No  . Sexual activity: Not on file  Lifestyle  . Physical activity:    Days per week: Not on file    Minutes per  session: Not on file  . Stress: Not on file  Relationships  . Social connections:    Talks on phone: Not on file    Gets together: Not on file    Attends religious service: Not on file    Active member of club or organization: Not on file    Attends meetings of clubs or organizations: Not on file    Relationship status: Not on file  . Intimate partner violence:    Fear of current or ex partner: Not on file    Emotionally abused: Not on file    Physically abused: Not on file    Forced sexual activity: Not on file  Other Topics Concern  . Not on file  Social History Narrative  . Not on file     Review of Systems: General: negative for chills, fever, night sweats or weight changes.  Cardiovascular: negative for chest pain, dyspnea on exertion, edema, orthopnea, palpitations, paroxysmal nocturnal dyspnea or shortness of breath Dermatological: negative for rash Respiratory: negative for cough or wheezing Urologic: negative for hematuria Abdominal: negative for nausea, vomiting, diarrhea, bright red blood per rectum, melena, or hematemesis Neurologic: negative for visual changes, syncope, or dizziness All other systems reviewed and are otherwise negative except as noted above.    Blood pressure 118/70,  pulse 72, height 5\' 2"  (1.575 m), weight 142 lb (64.4 kg).  General appearance: alert and no distress Neck: no adenopathy, no carotid bruit, no JVD, supple, symmetrical, trachea midline and thyroid not enlarged, symmetric, no tenderness/mass/nodules Lungs: clear to auscultation bilaterally Heart: regular rate and rhythm, S1, S2 normal, no murmur, click, rub or gallop Extremities: extremities normal, atraumatic, no cyanosis or edema Pulses: 2+ and symmetric Skin: Skin color, texture, turgor normal. No rashes or lesions Neurologic: Alert and oriented X 3, normal strength and tone. Normal symmetric reflexes. Normal coordination and gait  EKG sinus rhythm at 72 with left axis deviation  poor R wave progression.  I personally reviewed this EKG.  ASSESSMENT AND PLAN:   Coronary artery calcification seen on CAT scan History of coronary calcification seen on screening chest CT 02/06/2018.  She does have positive risk factors otherwise.  We will get a pharmacologic Myoview stress test to further evaluate.  Hyperlipidemia History of hyperlipidemia recently started on statin therapy.  We will recheck a lipid liver profile in 3 months  Tobacco abuse History of ongoing tobacco abuse recalcitrant to risk factor modification.  Claudication in peripheral vascular disease (HCC) History of right lower extremity claudication.  We will get aortoiliac and lower extremity arterial Doppler studies to further evaluate  Essential hypertension History of essential hypertension on amlodipine with blood pressure measured today at 118/70.      Runell GessJonathan J. Domique Reardon MD FACP,FACC,FAHA, [facility] 03/06/2018 2:40 PM

## 2018-03-06 NOTE — Assessment & Plan Note (Signed)
History of ongoing tobacco abuse recalcitrant to risk factor modification. 

## 2018-03-11 ENCOUNTER — Other Ambulatory Visit: Payer: Self-pay | Admitting: Cardiovascular Disease

## 2018-03-11 DIAGNOSIS — I739 Peripheral vascular disease, unspecified: Secondary | ICD-10-CM

## 2018-03-22 ENCOUNTER — Telehealth (HOSPITAL_COMMUNITY): Payer: Self-pay

## 2018-03-22 NOTE — Telephone Encounter (Signed)
Encounter complete. 

## 2018-03-26 ENCOUNTER — Telehealth (HOSPITAL_COMMUNITY): Payer: Self-pay

## 2018-03-26 NOTE — Telephone Encounter (Signed)
Encounter complete. 

## 2018-03-27 ENCOUNTER — Ambulatory Visit (HOSPITAL_COMMUNITY)
Admission: RE | Admit: 2018-03-27 | Discharge: 2018-03-27 | Disposition: A | Discharge status: Home / Self Care | Payer: BLUE CROSS/BLUE SHIELD | Source: Ambulatory Visit | Attending: Cardiology | Admitting: Cardiology

## 2018-03-27 DIAGNOSIS — I2510 Atherosclerotic heart disease of native coronary artery without angina pectoris: Secondary | ICD-10-CM | POA: Insufficient documentation

## 2018-03-27 LAB — MYOCARDIAL PERFUSION IMAGING
CHL CUP NUCLEAR SSS: 4
LVDIAVOL: 86 mL (ref 46–106)
LVSYSVOL: 43 mL
Peak HR: 115 {beats}/min
Rest HR: 68 {beats}/min
SDS: 4
SRS: 0
TID: 1.38

## 2018-03-27 MED ORDER — TECHNETIUM TC 99M TETROFOSMIN IV KIT
31.6000 | PACK | Freq: Once | INTRAVENOUS | Status: AC | PRN
  Administered 2018-03-27: 31.6 via INTRAVENOUS
  Filled 2018-03-27: qty 32

## 2018-03-27 MED ORDER — TECHNETIUM TC 99M TETROFOSMIN IV KIT
10.5000 | PACK | Freq: Once | INTRAVENOUS | Status: AC | PRN
  Administered 2018-03-27: 10.5 via INTRAVENOUS
  Filled 2018-03-27: qty 11

## 2018-03-27 MED ORDER — REGADENOSON 0.4 MG/5ML IV SOLN
0.4000 mg | Freq: Once | INTRAVENOUS | Status: AC
  Administered 2018-03-27: 0.4 mg via INTRAVENOUS

## 2018-04-03 ENCOUNTER — Encounter (HOSPITAL_COMMUNITY): Payer: BLUE CROSS/BLUE SHIELD

## 2018-04-03 ENCOUNTER — Ambulatory Visit (HOSPITAL_COMMUNITY)
Admission: RE | Admit: 2018-04-03 | Discharge: 2018-04-03 | Disposition: A | Discharge status: Home / Self Care | Payer: BLUE CROSS/BLUE SHIELD | Source: Ambulatory Visit | Attending: Cardiology | Admitting: Cardiology

## 2018-04-03 DIAGNOSIS — I739 Peripheral vascular disease, unspecified: Secondary | ICD-10-CM

## 2018-04-24 ENCOUNTER — Encounter: Payer: Self-pay | Admitting: Cardiovascular Disease

## 2018-04-24 ENCOUNTER — Ambulatory Visit (INDEPENDENT_AMBULATORY_CARE_PROVIDER_SITE_OTHER): Payer: BLUE CROSS/BLUE SHIELD | Admitting: Cardiovascular Disease

## 2018-04-24 DIAGNOSIS — I739 Peripheral vascular disease, unspecified: Secondary | ICD-10-CM

## 2018-04-24 DIAGNOSIS — I10 Essential (primary) hypertension: Secondary | ICD-10-CM | POA: Diagnosis not present

## 2018-04-24 DIAGNOSIS — E782 Mixed hyperlipidemia: Secondary | ICD-10-CM

## 2018-04-24 DIAGNOSIS — Z720 Tobacco use: Secondary | ICD-10-CM

## 2018-04-24 DIAGNOSIS — I2510 Atherosclerotic heart disease of native coronary artery without angina pectoris: Secondary | ICD-10-CM | POA: Diagnosis not present

## 2018-04-24 NOTE — Assessment & Plan Note (Signed)
History of ongoing tobacco abuse with 3 to 4 cigarettes a day.

## 2018-04-24 NOTE — Assessment & Plan Note (Signed)
Ms. Throneberry a chest CT performed 02/06/2018 for screening purposes that showed coronary calcification.  Based on this I performed Myoview stress testing 03/27/2018 which was entirely normal.

## 2018-04-24 NOTE — Assessment & Plan Note (Signed)
History of essential hypertension her blood pressure measured today 122/80.  She is on amlodipine.

## 2018-04-24 NOTE — Assessment & Plan Note (Signed)
She of right calf lifestyle limiting claudication with recent Doppler study performed 04/03/2018 revealing a right ABI 0.78 and a left of 1.01.  She did have a high-frequency signal in her mid right SFA.  Based on this, we decided to proceed with outpatient peripheral angiography and potential endovascular therapy for lifestyle limiting claudication.

## 2018-04-24 NOTE — Addendum Note (Signed)
Addended by: Harlow Asa on: 04/24/2018 03:51 PM   Modules accepted: Orders

## 2018-04-24 NOTE — H&P (View-Only) (Signed)
04/24/2018 Cynthia Hobbs   1955/10/23  [id number]  Primary Physician Merri Brunette, MD Primary Cardiologist: Runell Gess MD Nicholes Calamity, [place]  HPI:  Cynthia Hobbs is a 63 y.o.  thin appearing divorced Caucasian female mother of 2, grandmother of 2 grandchildren who is accompanied by her mother Cynthia Hobbs who is also a patient of mine.  She was referred by Dr. Merri Brunette for cardiac evaluation because of coronary calcification found on screening chest CT. I last saw her in the office 03/06/2018. Risk factors include 40 pack years of tobacco use, treated hypertension and hyperlipidemia.  Mother did have carotid artery disease with endarterectomy recently.  She is never had a heart attack or stroke.  She denies chest pain or shortness of breath patient does complain of right lower extremity claudication however.  Since I saw her she has had a Myoview stress test performed 03/27/2018 which was entirely normal and lower extremity Dopplers performed 04/03/2018 which revealed a right ABI of 0.78 and left of 1.01 with a high-frequency signal in her mid right SFA.  Based on this, she wishes to proceed with outpatient angiography potential endovascular therapy.   Current Meds  Medication Sig  . amLODipine (NORVASC) 5 MG tablet Take 5 mg by mouth daily.  . Cholecalciferol (VITAMIN D3) 50 MCG (2000 UT) TABS Take 2,000 Units by mouth daily.  . Ibuprofen (ADVIL PO) Take by mouth.  Marland Kitchen OVER THE COUNTER MEDICATION Take 1 capsule by mouth daily. Equate brand of Prilosec  . rosuvastatin (CRESTOR) 10 MG tablet Take 10 mg by mouth daily.     No Known Allergies  Social History   Socioeconomic History  . Marital status: Single    Spouse name: Not on file  . Number of children: Not on file  . Years of education: Not on file  . Highest education level: Not on file  Occupational History  . Not on file  Social Needs  . Financial resource strain: Not on file  . Food insecurity:    Worry: Not on  file    Inability: Not on file  . Transportation needs:    Medical: Not on file    Non-medical: Not on file  Tobacco Use  . Smoking status: Current Every Day Smoker  . Smokeless tobacco: Never Used  Substance and Sexual Activity  . Alcohol use: No  . Drug use: No  . Sexual activity: Not on file  Lifestyle  . Physical activity:    Days per week: Not on file    Minutes per session: Not on file  . Stress: Not on file  Relationships  . Social connections:    Talks on phone: Not on file    Gets together: Not on file    Attends religious service: Not on file    Active member of club or organization: Not on file    Attends meetings of clubs or organizations: Not on file    Relationship status: Not on file  . Intimate partner violence:    Fear of current or ex partner: Not on file    Emotionally abused: Not on file    Physically abused: Not on file    Forced sexual activity: Not on file  Other Topics Concern  . Not on file  Social History Narrative  . Not on file     Review of Systems: General: negative for chills, fever, night sweats or weight changes.  Cardiovascular: negative for chest pain, dyspnea  on exertion, edema, orthopnea, palpitations, paroxysmal nocturnal dyspnea or shortness of breath Dermatological: negative for rash Respiratory: negative for cough or wheezing Urologic: negative for hematuria Abdominal: negative for nausea, vomiting, diarrhea, bright red blood per rectum, melena, or hematemesis Neurologic: negative for visual changes, syncope, or dizziness All other systems reviewed and are otherwise negative except as noted above.    Blood pressure 122/80, pulse 78, height 5\' 2"  (1.575 m), weight 145 lb (65.8 kg), SpO2 92 %.  General appearance: alert and no distress Neck: no adenopathy, no carotid bruit, no JVD, supple, symmetrical, trachea midline and thyroid not enlarged, symmetric, no tenderness/mass/nodules Lungs: clear to auscultation  bilaterally Heart: regular rate and rhythm, S1, S2 normal, no murmur, click, rub or gallop Extremities: extremities normal, atraumatic, no cyanosis or edema Pulses: 2+ and symmetric Skin: Skin color, texture, turgor normal. No rashes or lesions Neurologic: Alert and oriented X 3, normal strength and tone. Normal symmetric reflexes. Normal coordination and gait  EKG not performed today  ASSESSMENT AND PLAN:   Coronary artery calcification seen on CAT scan Ms. Danzer a chest CT performed 02/06/2018 for screening purposes that showed coronary calcification.  Based on this I performed Myoview stress testing 03/27/2018 which was entirely normal.  Hyperlipidemia History of hyperlipidemia currently on Crestor followed by his PCP  Tobacco abuse History of ongoing tobacco abuse with 3 to 4 cigarettes a day.  Essential hypertension History of essential hypertension her blood pressure measured today 122/80.  She is on amlodipine.  Claudication in peripheral vascular disease (HCC) She of right calf lifestyle limiting claudication with recent Doppler study performed 04/03/2018 revealing a right ABI 0.78 and a left of 1.01.  She did have a high-frequency signal in her mid right SFA.  Based on this, we decided to proceed with outpatient peripheral angiography and potential endovascular therapy for lifestyle limiting claudication.      Runell Gess MD FACP,FACC,FAHA, [facility] 04/24/2018 2:58 PM

## 2018-04-24 NOTE — Assessment & Plan Note (Signed)
History of hyperlipidemia currently on Crestor followed by his PCP

## 2018-04-24 NOTE — Progress Notes (Signed)
04/24/2018 Cynthia Hobbs   1955/10/23  [id number]  Primary Physician Merri Brunette, MD Primary Cardiologist: Runell Gess MD Nicholes Calam[place], [place]  HPI:  Cynthia Hobbs is a 63 y.o.  thin appearing divorced Caucasian female mother of 2, grandmother of 2 grandchildren who is accompanied by her mother Cynthia Hobbs who is also a patient of mine.  She was referred by Dr. Merri Brunette for cardiac evaluation because of coronary calcification found on screening chest CT. I last saw her in the office 03/06/2018. Risk factors include 40 pack years of tobacco use, treated hypertension and hyperlipidemia.  Mother did have carotid artery disease with endarterectomy recently.  She is never had a heart attack or stroke.  She denies chest pain or shortness of breath patient does complain of right lower extremity claudication however.  Since I saw her she has had a Myoview stress test performed 03/27/2018 which was entirely normal and lower extremity Dopplers performed 04/03/2018 which revealed a right ABI of 0.78 and left of 1.01 with a high-frequency signal in her mid right SFA.  Based on this, she wishes to proceed with outpatient angiography potential endovascular therapy.   Current Meds  Medication Sig  . amLODipine (NORVASC) 5 MG tablet Take 5 mg by mouth daily.  . Cholecalciferol (VITAMIN D3) 50 MCG (2000 UT) TABS Take 2,000 Units by mouth daily.  . Ibuprofen (ADVIL PO) Take by mouth.  Marland Kitchen OVER THE COUNTER MEDICATION Take 1 capsule by mouth daily. Equate brand of Prilosec  . rosuvastatin (CRESTOR) 10 MG tablet Take 10 mg by mouth daily.     No Known Allergies  Social History   Socioeconomic History  . Marital status: Single    Spouse name: Not on file  . Number of children: Not on file  . Years of education: Not on file  . Highest education level: Not on file  Occupational History  . Not on file  Social Needs  . Financial resource strain: Not on file  . Food insecurity:    Worry: Not on  file    Inability: Not on file  . Transportation needs:    Medical: Not on file    Non-medical: Not on file  Tobacco Use  . Smoking status: Current Every Day Smoker  . Smokeless tobacco: Never Used  Substance and Sexual Activity  . Alcohol use: No  . Drug use: No  . Sexual activity: Not on file  Lifestyle  . Physical activity:    Days per week: Not on file    Minutes per session: Not on file  . Stress: Not on file  Relationships  . Social connections:    Talks on phone: Not on file    Gets together: Not on file    Attends religious service: Not on file    Active member of club or organization: Not on file    Attends meetings of clubs or organizations: Not on file    Relationship status: Not on file  . Intimate partner violence:    Fear of current or ex partner: Not on file    Emotionally abused: Not on file    Physically abused: Not on file    Forced sexual activity: Not on file  Other Topics Concern  . Not on file  Social History Narrative  . Not on file     Review of Systems: General: negative for chills, fever, night sweats or weight changes.  Cardiovascular: negative for chest pain, dyspnea  on exertion, edema, orthopnea, palpitations, paroxysmal nocturnal dyspnea or shortness of breath Dermatological: negative for rash Respiratory: negative for cough or wheezing Urologic: negative for hematuria Abdominal: negative for nausea, vomiting, diarrhea, bright red blood per rectum, melena, or hematemesis Neurologic: negative for visual changes, syncope, or dizziness All other systems reviewed and are otherwise negative except as noted above.    Blood pressure 122/80, pulse 78, height 5' 2" (1.575 m), weight 145 lb (65.8 kg), SpO2 92 %.  General appearance: alert and no distress Neck: no adenopathy, no carotid bruit, no JVD, supple, symmetrical, trachea midline and thyroid not enlarged, symmetric, no tenderness/mass/nodules Lungs: clear to auscultation  bilaterally Heart: regular rate and rhythm, S1, S2 normal, no murmur, click, rub or gallop Extremities: extremities normal, atraumatic, no cyanosis or edema Pulses: 2+ and symmetric Skin: Skin color, texture, turgor normal. No rashes or lesions Neurologic: Alert and oriented X 3, normal strength and tone. Normal symmetric reflexes. Normal coordination and gait  EKG not performed today  ASSESSMENT AND PLAN:   Coronary artery calcification seen on CAT scan Ms. Mehlhaff a chest CT performed 02/06/2018 for screening purposes that showed coronary calcification.  Based on this I performed Myoview stress testing 03/27/2018 which was entirely normal.  Hyperlipidemia History of hyperlipidemia currently on Crestor followed by his PCP  Tobacco abuse History of ongoing tobacco abuse with 3 to 4 cigarettes a day.  Essential hypertension History of essential hypertension her blood pressure measured today 122/80.  She is on amlodipine.  Claudication in peripheral vascular disease (HCC) She of right calf lifestyle limiting claudication with recent Doppler study performed 04/03/2018 revealing a right ABI 0.78 and a left of 1.01.  She did have a high-frequency signal in her mid right SFA.  Based on this, we decided to proceed with outpatient peripheral angiography and potential endovascular therapy for lifestyle limiting claudication.      Binnie Vonderhaar J. Paz Fuentes MD FACP,FACC,FAHA, FSCAI 04/24/2018 2:58 PM 

## 2018-04-24 NOTE — Patient Instructions (Signed)
    [facility] CARDIOVASCULAR DIVISION [facility] [address] [place] [address] Dept: [phone] Loc: [phone]  DALI ESTESS  04/24/2018  You are scheduled for a Peripheral Angiogram on Monday, February 10 with Dr. [name].  1. Please arrive at the [facility] (Main Entrance A) at [facility]: [address] [place], [place] [address] at 7:30 AM (This time is two hours before your procedure to ensure your preparation). Free valet parking service is available.   Special note: Every effort is made to have your procedure done on time. Please understand that emergencies sometimes delay scheduled procedures.  2. Diet: Do not eat solid foods after midnight.  The patient may have clear liquids until 5am upon the day of the procedure.  3. Labs: You will need to have blood drawn today: CBC, BMP 4. Medication instructions in preparation for your procedure:   On the morning of your procedure take any morning medicines NOT listed above.  You may use sips of water.  5. Plan for one night stay--bring personal belongings. 6. Bring a current list of your medications and current insurance cards. 7. You MUST have a responsible person to drive you home. 8. Someone MUST be with you the first 24 hours after you arrive home or your discharge will be delayed. 9. Please wear clothes that are easy to get on and off and wear slip-on shoes.  Thank you for allowing [place] to care for you!   -- [facility]   TESTS: Your physician has requested that you have an aorta/iliac duplex. During this test, an ultrasound is used to evaluate blood flow to the aorta and iliac arteries. Allow one hour for this exam. Do not eat after midnight the day before and avoid carbonated beverages.  SCHEDULE 2 WEEKS POST PROCEDURE  FOLLOW UP:  Please schedule a follow up appointment with Dr. Allyson Sabal 3 weeks after your  procedure.

## 2018-04-25 ENCOUNTER — Telehealth: Payer: Self-pay | Admitting: *Deleted

## 2018-04-25 LAB — BASIC METABOLIC PANEL
BUN/Creatinine Ratio: 18 (ref 12–28)
BUN: 15 mg/dL (ref 8–27)
CO2: 22 mmol/L (ref 20–29)
CREATININE: 0.85 mg/dL (ref 0.57–1.00)
Calcium: 9.6 mg/dL (ref 8.7–10.3)
Chloride: 105 mmol/L (ref 96–106)
GFR calc Af Amer: 86 mL/min/{1.73_m2} (ref >59)
GFR calc non Af Amer: 74 mL/min/{1.73_m2} (ref >59)
GLUCOSE: 87 mg/dL (ref 65–99)
Potassium: 4.5 mmol/L (ref 3.5–5.2)
SODIUM: 143 mmol/L (ref 134–144)

## 2018-04-25 LAB — CBC
Hematocrit: 42.3 % (ref 34.0–46.6)
Hemoglobin: 14 g/dL (ref 11.1–15.9)
MCH: 28.9 pg (ref 26.6–33.0)
MCHC: 33.1 g/dL (ref 31.5–35.7)
MCV: 87 fL (ref 79–97)
Platelets: 265 10*3/uL (ref 150–450)
RBC: 4.84 x10E6/uL (ref 3.77–5.28)
RDW: 11.5 % — ABNORMAL LOW (ref 11.7–15.4)
WBC: 9.5 10*3/uL (ref 3.4–10.8)

## 2018-04-25 NOTE — Telephone Encounter (Signed)
Pt contacted pre-PV procedure scheduled at [facility] for: Monday April 29, 2018 9:30 AM Verified arrival time and place: [facility] Main Entrance A at: 7:30 AM  No solid food after midnight prior to cath, clear liquids until 5 AM day of procedure. Contrast allergy: no  AM meds can be  taken pre-cath with sip of water including: ASA 81 mg  Confirmed patient has responsible person to drive home post procedure and observe 24 hours after arriving home: yes

## 2018-04-26 ENCOUNTER — Other Ambulatory Visit: Payer: Self-pay

## 2018-04-26 DIAGNOSIS — I739 Peripheral vascular disease, unspecified: Secondary | ICD-10-CM

## 2018-04-29 ENCOUNTER — Encounter (HOSPITAL_COMMUNITY): Payer: Self-pay | Admitting: Cardiovascular Disease

## 2018-04-29 ENCOUNTER — Other Ambulatory Visit: Payer: Self-pay

## 2018-04-29 ENCOUNTER — Ambulatory Visit (HOSPITAL_COMMUNITY)
Admission: RE | Admit: 2018-04-29 | Discharge: 2018-04-30 | Disposition: A | Discharge status: Home / Self Care | Payer: BLUE CROSS/BLUE SHIELD | Attending: Cardiovascular Disease | Admitting: Cardiovascular Disease

## 2018-04-29 ENCOUNTER — Ambulatory Visit (HOSPITAL_COMMUNITY)
Admission: RE | Disposition: A | Discharge status: Home / Self Care | Payer: Self-pay | Source: Home / Self Care | Attending: Cardiovascular Disease

## 2018-04-29 DIAGNOSIS — Z8249 Family history of ischemic heart disease and other diseases of the circulatory system: Secondary | ICD-10-CM | POA: Insufficient documentation

## 2018-04-29 DIAGNOSIS — F17210 Nicotine dependence, cigarettes, uncomplicated: Secondary | ICD-10-CM | POA: Insufficient documentation

## 2018-04-29 DIAGNOSIS — E785 Hyperlipidemia, unspecified: Secondary | ICD-10-CM | POA: Diagnosis not present

## 2018-04-29 DIAGNOSIS — I10 Essential (primary) hypertension: Secondary | ICD-10-CM | POA: Diagnosis not present

## 2018-04-29 DIAGNOSIS — I2510 Atherosclerotic heart disease of native coronary artery without angina pectoris: Secondary | ICD-10-CM | POA: Diagnosis not present

## 2018-04-29 DIAGNOSIS — I70211 Atherosclerosis of native arteries of extremities with intermittent claudication, right leg: Secondary | ICD-10-CM | POA: Diagnosis not present

## 2018-04-29 DIAGNOSIS — Z79899 Other long term (current) drug therapy: Secondary | ICD-10-CM | POA: Diagnosis not present

## 2018-04-29 DIAGNOSIS — I739 Peripheral vascular disease, unspecified: Secondary | ICD-10-CM | POA: Diagnosis present

## 2018-04-29 HISTORY — PX: PERIPHERAL VASCULAR ATHERECTOMY: CATH118256

## 2018-04-29 HISTORY — DX: Peripheral vascular disease, unspecified: I73.9

## 2018-04-29 HISTORY — PX: ABDOMINAL AORTOGRAM W/LOWER EXTREMITY: CATH118223

## 2018-04-29 LAB — POCT ACTIVATED CLOTTING TIME
Activated Clotting Time: 224 seconds
Activated Clotting Time: 367 seconds
Activated Clotting Time: 186 seconds
Activated Clotting Time: 153 seconds

## 2018-04-29 SURGERY — ABDOMINAL AORTOGRAM W/LOWER EXTREMITY
Anesthesia: LOCAL | Laterality: Right

## 2018-04-29 MED ORDER — LABETALOL HCL 5 MG/ML IV SOLN: 10.0000 mg | INTRAVENOUS | Status: DC | PRN

## 2018-04-29 MED ORDER — FENTANYL CITRATE (PF) 100 MCG/2ML IJ SOLN
INTRAMUSCULAR | Status: AC
  Filled 2018-04-29: qty 2

## 2018-04-29 MED ORDER — SODIUM CHLORIDE 0.9 % IV SOLN
INTRAVENOUS | Status: AC
  Administered 2018-04-29: 12:00:00 via INTRAVENOUS

## 2018-04-29 MED ORDER — ONDANSETRON HCL 4 MG/2ML IJ SOLN: 4.0000 mg | Freq: Four times a day (QID) | INTRAMUSCULAR | Status: DC | PRN

## 2018-04-29 MED ORDER — MIDAZOLAM HCL 2 MG/2ML IJ SOLN
INTRAMUSCULAR | Status: DC | PRN
  Administered 2018-04-29: 1 mg via INTRAVENOUS

## 2018-04-29 MED ORDER — IODIXANOL 320 MG/ML IV SOLN
INTRAVENOUS | Status: DC | PRN
  Administered 2018-04-29: 185 mL via INTRAVENOUS

## 2018-04-29 MED ORDER — MIDAZOLAM HCL 2 MG/2ML IJ SOLN
INTRAMUSCULAR | Status: AC
  Filled 2018-04-29: qty 2

## 2018-04-29 MED ORDER — NITROGLYCERIN 1 MG/10 ML FOR IR/CATH LAB
INTRA_ARTERIAL | Status: AC
  Filled 2018-04-29: qty 10

## 2018-04-29 MED ORDER — NITROGLYCERIN 1 MG/10 ML FOR IR/CATH LAB
INTRA_ARTERIAL | Status: DC | PRN
  Administered 2018-04-29 (×2): 200 ug

## 2018-04-29 MED ORDER — SODIUM CHLORIDE 0.9% FLUSH: 3.0000 mL | Freq: Two times a day (BID) | INTRAVENOUS | Status: DC

## 2018-04-29 MED ORDER — ACETAMINOPHEN 325 MG PO TABS
650.0000 mg | ORAL_TABLET | ORAL | Status: DC | PRN
  Administered 2018-04-29: 12:00:00 650 mg via ORAL
  Filled 2018-04-29: qty 2

## 2018-04-29 MED ORDER — AMLODIPINE BESYLATE 5 MG PO TABS
5.0000 mg | ORAL_TABLET | Freq: Every day | ORAL | Status: DC
  Administered 2018-04-29 – 2018-04-30 (×2): 5 mg via ORAL
  Filled 2018-04-29 (×2): qty 1

## 2018-04-29 MED ORDER — CLOPIDOGREL BISULFATE 75 MG PO TABS
75.0000 mg | ORAL_TABLET | Freq: Every day | ORAL | Status: DC
  Administered 2018-04-30: 75 mg via ORAL
  Filled 2018-04-29: qty 1

## 2018-04-29 MED ORDER — ASPIRIN EC 81 MG PO TBEC
81.0000 mg | DELAYED_RELEASE_TABLET | Freq: Every day | ORAL | Status: DC
  Administered 2018-04-30: 81 mg via ORAL
  Filled 2018-04-29: qty 1

## 2018-04-29 MED ORDER — SODIUM CHLORIDE 0.9% FLUSH: 3.0000 mL | INTRAVENOUS | Status: DC | PRN

## 2018-04-29 MED ORDER — HEPARIN (PORCINE) IN NACL 1000-0.9 UT/500ML-% IV SOLN
INTRAVENOUS | Status: DC | PRN
  Administered 2018-04-29 (×2): 500 mL

## 2018-04-29 MED ORDER — ASPIRIN 81 MG PO CHEW: 81.0000 mg | CHEWABLE_TABLET | ORAL | Status: DC

## 2018-04-29 MED ORDER — HEPARIN SODIUM (PORCINE) 1000 UNIT/ML IJ SOLN
INTRAMUSCULAR | Status: DC | PRN
  Administered 2018-04-29: 7500 [IU] via INTRAVENOUS
  Administered 2018-04-29: 3000 [IU] via INTRAVENOUS

## 2018-04-29 MED ORDER — FENTANYL CITRATE (PF) 100 MCG/2ML IJ SOLN
INTRAMUSCULAR | Status: DC | PRN
  Administered 2018-04-29: 25 ug via INTRAVENOUS

## 2018-04-29 MED ORDER — SODIUM CHLORIDE 0.9 % IV SOLN: 250.0000 mL | INTRAVENOUS | Status: DC | PRN

## 2018-04-29 MED ORDER — LIDOCAINE HCL (PF) 1 % IJ SOLN
INTRAMUSCULAR | Status: AC
  Filled 2018-04-29: qty 30

## 2018-04-29 MED ORDER — HYDRALAZINE HCL 20 MG/ML IJ SOLN: 5.0000 mg | INTRAMUSCULAR | Status: DC | PRN

## 2018-04-29 MED ORDER — SODIUM CHLORIDE 0.9% FLUSH
3.0000 mL | Freq: Two times a day (BID) | INTRAVENOUS | Status: DC
  Administered 2018-04-29 – 2018-04-30 (×3): 3 mL via INTRAVENOUS

## 2018-04-29 MED ORDER — MORPHINE SULFATE (PF) 2 MG/ML IV SOLN
2.0000 mg | Freq: Once | INTRAVENOUS | Status: AC
  Administered 2018-04-29: 13:00:00 2 mg via INTRAVENOUS

## 2018-04-29 MED ORDER — SODIUM CHLORIDE 0.9 % WEIGHT BASED INFUSION
3.0000 mL/kg/h | INTRAVENOUS | Status: DC
  Administered 2018-04-29: 3 mL/kg/h via INTRAVENOUS

## 2018-04-29 MED ORDER — CLOPIDOGREL BISULFATE 300 MG PO TABS
ORAL_TABLET | ORAL | Status: DC | PRN
  Administered 2018-04-29: 300 mg via ORAL

## 2018-04-29 MED ORDER — HEPARIN (PORCINE) IN NACL 1000-0.9 UT/500ML-% IV SOLN
INTRAVENOUS | Status: AC
  Filled 2018-04-29: qty 500

## 2018-04-29 MED ORDER — SODIUM CHLORIDE 0.9 % WEIGHT BASED INFUSION: 1.0000 mL/kg/h | INTRAVENOUS | Status: DC

## 2018-04-29 MED ORDER — MORPHINE SULFATE (PF) 2 MG/ML IV SOLN
2.0000 mg | Freq: Once | INTRAVENOUS | Status: AC
  Administered 2018-04-29: 4 mg via INTRAVENOUS
  Filled 2018-04-29: qty 3

## 2018-04-29 MED ORDER — ROSUVASTATIN CALCIUM 5 MG PO TABS
10.0000 mg | ORAL_TABLET | Freq: Every day | ORAL | Status: DC
  Administered 2018-04-29 – 2018-04-30 (×2): 10 mg via ORAL
  Filled 2018-04-29 (×2): qty 2

## 2018-04-29 MED ORDER — HEPARIN SODIUM (PORCINE) 1000 UNIT/ML IJ SOLN
INTRAMUSCULAR | Status: AC
  Filled 2018-04-29: qty 1

## 2018-04-29 MED ORDER — GUAIFENESIN-DM 100-10 MG/5ML PO SYRP
5.0000 mL | ORAL_SOLUTION | ORAL | Status: DC | PRN
  Administered 2018-04-29 – 2018-04-30 (×3): 5 mL via ORAL
  Filled 2018-04-29 (×3): qty 5

## 2018-04-29 MED ORDER — CLOPIDOGREL BISULFATE 300 MG PO TABS
ORAL_TABLET | ORAL | Status: AC
  Filled 2018-04-29: qty 1

## 2018-04-29 SURGICAL SUPPLY — 26 items
BALLN CHOCOLATE 5.0X40X120 (BALLOONS) ×3
BALLN COYOTE OTW 2.5X100X150 (BALLOONS) ×3
BALLN IN.PACT DCB 5X150 (BALLOONS) ×3
BALLOON CHOCOLATE 5.0X40X120 (BALLOONS) ×2 IMPLANT
BALLOON COYOTE OTW 2.5X100X150 (BALLOONS) ×2 IMPLANT
CATH ANGIO 5F PIGTAIL 65CM (CATHETERS) ×6 IMPLANT
CATH CROSS OVER TEMPO 5F (CATHETERS) ×3 IMPLANT
CATH HAWKONE LX EXTENDED TIP (CATHETERS) ×3 IMPLANT
DCB IN.PACT 5X150 (BALLOONS) ×2 IMPLANT
DEVICE CONTINUOUS FLUSH (MISCELLANEOUS) ×3 IMPLANT
DEVICE SPIDERFX EMB PROT 6MM (WIRE) ×3 IMPLANT
KIT ENCORE 26 ADVANTAGE (KITS) ×3 IMPLANT
KIT PV (KITS) ×3 IMPLANT
SHEATH HIGHFLEX ANSEL 7FR 55CM (SHEATH) ×3 IMPLANT
SHEATH PINNACLE 5F 10CM (SHEATH) ×3 IMPLANT
SHEATH PINNACLE 7F 10CM (SHEATH) ×3 IMPLANT
SHEATH PROBE COVER 6X72 (BAG) ×3 IMPLANT
STOPCOCK MORSE 400PSI 3WAY (MISCELLANEOUS) ×3 IMPLANT
SYR MEDRAD MARK 7 150ML (SYRINGE) ×3 IMPLANT
TAPE VIPERTRACK RADIOPAQ (MISCELLANEOUS) ×2 IMPLANT
TAPE VIPERTRACK RADIOPAQUE (MISCELLANEOUS) ×1
TRANSDUCER W/STOPCOCK (MISCELLANEOUS) ×3 IMPLANT
TRAY PV CATH (CUSTOM PROCEDURE TRAY) ×3 IMPLANT
TUBING CIL FLEX 10 FLL-RA (TUBING) ×3 IMPLANT
WIRE HITORQ VERSACORE ST 145CM (WIRE) ×3 IMPLANT
WIRE SPARTACORE .014X300CM (WIRE) ×3 IMPLANT

## 2018-04-29 NOTE — Interval H&P Note (Signed)
History and Physical Interval Note:  04/29/2018 9:22 AM  Cynthia Hobbs  has presented today for surgery, with the diagnosis of pad  The various methods of treatment have been discussed with the patient and family. After consideration of risks, benefits and other options for treatment, the patient has consented to  Procedure(s): ABDOMINAL AORTOGRAM W/LOWER EXTREMITY (Bilateral) as a surgical intervention .  The patient's history has been reviewed, patient examined, no change in status, stable for surgery.  I have reviewed the patient's chart and labs.  Questions were answered to the patient's satisfaction.     [name]

## 2018-04-30 ENCOUNTER — Encounter (HOSPITAL_COMMUNITY): Payer: Self-pay | Admitting: General Practice

## 2018-04-30 DIAGNOSIS — I70211 Atherosclerosis of native arteries of extremities with intermittent claudication, right leg: Secondary | ICD-10-CM | POA: Diagnosis not present

## 2018-04-30 DIAGNOSIS — I10 Essential (primary) hypertension: Secondary | ICD-10-CM | POA: Diagnosis not present

## 2018-04-30 DIAGNOSIS — I739 Peripheral vascular disease, unspecified: Secondary | ICD-10-CM

## 2018-04-30 DIAGNOSIS — I2510 Atherosclerotic heart disease of native coronary artery without angina pectoris: Secondary | ICD-10-CM | POA: Diagnosis not present

## 2018-04-30 DIAGNOSIS — Z79899 Other long term (current) drug therapy: Secondary | ICD-10-CM | POA: Diagnosis not present

## 2018-04-30 DIAGNOSIS — Z8249 Family history of ischemic heart disease and other diseases of the circulatory system: Secondary | ICD-10-CM | POA: Diagnosis not present

## 2018-04-30 DIAGNOSIS — E785 Hyperlipidemia, unspecified: Secondary | ICD-10-CM | POA: Diagnosis not present

## 2018-04-30 DIAGNOSIS — F17210 Nicotine dependence, cigarettes, uncomplicated: Secondary | ICD-10-CM | POA: Diagnosis not present

## 2018-04-30 LAB — CBC
HCT: 42 % (ref 36.0–46.0)
Hemoglobin: 13.2 g/dL (ref 12.0–15.0)
MCH: 28.1 pg (ref 26.0–34.0)
MCHC: 31.4 g/dL (ref 30.0–36.0)
MCV: 89.6 fL (ref 80.0–100.0)
Platelets: 244 10*3/uL (ref 150–400)
RBC: 4.69 MIL/uL (ref 3.87–5.11)
RDW: 11.9 % (ref 11.5–15.5)
WBC: 10.5 10*3/uL (ref 4.0–10.5)
nRBC: 0 % (ref 0.0–0.2)

## 2018-04-30 LAB — BASIC METABOLIC PANEL
Anion gap: 11 (ref 5–15)
BUN: 14 mg/dL (ref 8–23)
CO2: 25 mmol/L (ref 22–32)
Calcium: 9.1 mg/dL (ref 8.9–10.3)
Chloride: 106 mmol/L (ref 98–111)
Creatinine, Ser: 0.71 mg/dL (ref 0.44–1.00)
GFR calc Af Amer: >60 mL/min (ref >60)
GFR calc non Af Amer: >60 mL/min (ref >60)
Glucose, Bld: 96 mg/dL (ref 70–99)
Potassium: 4.1 mmol/L (ref 3.5–5.1)
Sodium: 142 mmol/L (ref 135–145)

## 2018-04-30 MED ORDER — ASPIRIN 81 MG PO TBEC: 81.0000 mg | DELAYED_RELEASE_TABLET | Freq: Every day | ORAL | 3 refills | Status: DC

## 2018-04-30 MED ORDER — CLOPIDOGREL BISULFATE 75 MG PO TABS: 75.0000 mg | ORAL_TABLET | Freq: Every day | ORAL | 3 refills | Status: AC

## 2018-04-30 MED ORDER — PANTOPRAZOLE SODIUM 40 MG PO TBEC
40.0000 mg | DELAYED_RELEASE_TABLET | Freq: Every day | ORAL | Status: DC
  Administered 2018-04-30: 40 mg via ORAL
  Filled 2018-04-30: qty 1

## 2018-04-30 MED ORDER — PANTOPRAZOLE SODIUM 40 MG PO TBEC: 40.0000 mg | DELAYED_RELEASE_TABLET | Freq: Every day | ORAL | 3 refills | Status: DC

## 2018-04-30 MED FILL — Lidocaine HCl Local Preservative Free (PF) Inj 1%: INTRAMUSCULAR | Qty: 30 | Status: AC

## 2018-04-30 MED FILL — PANTOPRAZOLE SOD DR 40 MG T: 40 | 30 days supply | Qty: 30 | Fill #0 | Status: TO

## 2018-04-30 MED FILL — ASPIRIN LOW DOSE 81 MG TBEC: 81 | 30 days supply | Qty: 30 | Fill #0 | Status: TO

## 2018-04-30 MED FILL — CLOPIDOGREL 75 MG TABLET: 75 | 30 days supply | Qty: 30 | Fill #0 | Status: TO

## 2018-04-30 NOTE — Discharge Instructions (Addendum)
° ° °  IF YOUR COUGH DOES NOT IMPROVE, SEE YOUR PRIMARY CARE PROVIDER FOR AN EVALUATION.

## 2018-04-30 NOTE — Discharge Summary (Addendum)
Discharge Summary    Patient ID: Cynthia Hobbs MRN: [id number]; DOB: 06/15/1955  Admit date: 04/29/2018 Discharge date: 04/30/2018  Primary Care Provider: Deland Pretty, MD  Primary Cardiologist: Quay Burow, MD   Discharge Diagnoses    Active Problems:   Claudication in peripheral vascular disease [facility])  Allergies No Known Allergies  Diagnostic Studies/Procedures    Abdominal aortogram with lower extremity peripheral arthrectomy 04/29/2018:  Angiographic Data:   1: Abdominal aorta- widely patent 2: Left lower extremity- 40% mid left SFA with three-vessel runoff 3: Right lower extremity- tandem 90 to 95% mid right SFA stenoses with three-vessel runoff.  The anterior tibial was the diminutive vessel and had slow blood flow.  IMPRESSION: Cynthia Hobbs has high-grade tandem mid right SFA stenoses probably responsible for Cynthia Hobbs symptoms.  We will proceed with directional atherectomy followed by drug-coated balloon angioplasty using distal protection.  Procedure Description: Contralateral access was obtained with a crossover catheter, 035 versa core wire and a 7 [place] 55 cm multipurpose Ansell sheath.  Patient received 10,500 units of heparin intravenously with an ACT of 367.  Total contrast administered the patient was 185 cc.  I was able to cross the 2 lesions with a 0.14 cm [place] core wire and performed predilatation with a 2.5 mm x 100 mm long coyote balloon.  Following this I placed a 6 mm spider distal protection device in the below the knee popliteal artery.  I then performed directional atherectomy using a Hawk 1 device and performed multiple circumferential cut to removing a significant amount of atherosclerotic plaque.  Following this I performed drug-coated balloon angioplasty with a 5 mm x 150 mm long impact Admiral balloon at 4 atm for 2-1/2 minutes.  The balloon did not cross the distal aspect of the lesion and therefore I needed to re-balloon this with a 5 mm x 4 cm chocolate  balloon at nominal pressures for 2 minutes resulting in reduction of tandem 95% mid right SFA stenoses to 0% residual.  There was a small linear dissection at the proximal edge of the impact Admiral balloon in the proximal right SFA at the did not appear to be blood flow limiting.  Angiography of the tibial vessels continued to show slow flow of the anterior tibial artery.  200 mcg of intra-arterial nitroglycerin was given twice during the case.  The patient received 300 mg of p.o. Plavix.  The Ansell sheath was withdrawn across the bifurcation and exchanged over an 035 wire for a short 7 [place] sheath which was then secured in place.  Final Impression: Successful Hawk 1 directional atherectomy followed by drug-coated balloon angioplasty using spider distal protection of high-grade tandem mid right SFA stenoses in the setting of lifestyle limiting claudication.  The sheath will be removed once ACT falls below 170 and pressure held.  Patient will be gently hydrated overnight and discharged home in the morning.  We will obtain lower extremity arterial Doppler studies of Cynthia Hobbs right leg next week in our [facility] line office and I will see Cynthia Hobbs back 2 to 3 weeks thereafter.  History of Present Illness    Cynthia Hobbs is a 63 y.o. who was referred by Dr. Deland Pretty for cardiac evaluation because of coronary calcification found on screening chest CT. I last saw Cynthia Hobbs in the office 03/06/2018.Risk factors include 40 pack years of tobacco use, treated hypertension and hyperlipidemia. Mother did have carotid artery disease with endarterectomy recently. Cynthia Hobbs is never had a heart attack or stroke. Cynthia Hobbs denies chest  pain or shortness of breath patient does complain of right lower extremity claudication however.  Since last office visit with Dr. Gwenlyn Found, Cynthia Hobbs underwent a Myoview stress test performed 03/27/2018 which was entirely normal and lower extremity Dopplers performed 04/03/2018 which revealed a right ABI of 0.78 and left  of 1.01 with a high-frequency signal in Cynthia Hobbs mid right SFA.  Based on this, Cynthia Hobbs wished to proceed with outpatient angiography potential endovascular therapy, plan for 04/29/2018:  Hospital Course     On 04/29/2018 patient underwent an abdominal program with lower extremity peripheral vascular arthrectomy which showed a widely patent abdominal aorta, 40% mid left SFA with three-vessel runoff and tandem 90 to 95% mid right SFA stenosis with three-vessel runoff.  Per procedure note, the anterior tibial was the diminutive vessel and had slow blood flow and which a directional arthrectomy followed by a DES balloon angioplasty using distal protection was performed due to lifestyle limiting claudication.  Follow-up Doppler studies to be performed 05/15/2018 at 8 AM with a follow-up visit with Dr. Alvester Chou on 05/22/2018 at 9:45 AM.  Will switch Cynthia Hobbs Prilosec to Protonix given the need for Plavix.  General: Well developed, well nourished, NAD Skin: Warm, dry, intact  Head: Normocephalic, atraumatic, clear, moist mucus membranes. Neck: Negative for carotid bruits. No JVD Lungs:Clear to ausculation bilaterally. No wheezes, rales, or rhonchi. Breathing is unlabored. Cardiovascular: RRR with S1 S2. No murmurs, rubs, gallops, or LV heave appreciated. Abdomen: Soft, non-tender, non-distended with normoactive bowel sounds. No obvious abdominal masses. MSK: Strength and tone appear normal for age. 5/5 in all extremities Extremities: No edema. No clubbing or cyanosis. DP/PT pulses 2+ bilaterally Neuro: Alert and oriented. No focal deficits. No facial asymmetry. MAE spontaneously. Psych: Responds to questions appropriately with normal affect.    Consultants: None  The patient was seen and examined by Dr. Irish Lack feels that Cynthia Hobbs is stable and ready for discharge today, 04/30/2018.  Discharge Vitals Blood pressure 96/79, pulse 85, temperature 98.5 F (36.9 C), temperature source Oral, resp. rate 16, height 5' 2"  (1.575  m), weight 65 kg, SpO2 95 %.  Filed Weights   04/29/18 0729 04/30/18 0545  Weight: 65.8 kg 65 kg   Labs & Radiologic Studies    CBC Recent Labs    04/30/18 0601  WBC 10.5  HGB 13.2  HCT 42.0  MCV 89.6  PLT 086   Basic Metabolic Panel Recent Labs    04/30/18 0601  NA 142  K 4.1  CL 106  CO2 25  GLUCOSE 96  BUN 14  CREATININE 0.71  CALCIUM 9.1  _____________  Vilar Ram [place] Le Art Seg Multi (segm&le Reynauds)  Result Date: 04/03/2018 LOWER EXTREMITY DOPPLER STUDY Indications: Patient presents today with complaints of worsening right leg              claudication symptoms for the past 6 months. Cynthia Hobbs states Cynthia Hobbs is              unable to walk to Cynthia Hobbs mailbox and back home without Cynthia Hobbs right leg              hurting. Cynthia Hobbs also c/o how difficult it is to get through Cynthia Hobbs 10              hour daily work schedule with Cynthia Hobbs constantly on Cynthia Hobbs feet and              walking. Cynthia Hobbs has pain in Cynthia Hobbs right leg when resting. High Risk Factors: Hypertension, hyperlipidemia, current  smoker.   Performing Technologist: Sharlett Iles RVT  Examination Guidelines: A complete evaluation includes at minimum, Doppler waveform signals and systolic blood pressure reading at the level of bilateral brachial, anterior tibial, and posterior tibial arteries, when vessel segments are accessible. Bilateral testing is considered an integral part of a complete examination. Photoelectric Plethysmograph (PPG) waveforms and toe systolic pressure readings are included as required and additional duplex testing as needed. Limited examinations for reoccurring indications may be performed as noted.  ABI Findings: +---------+------------------+-----+----------------+--------+ Right    Rt Pressure (mmHg)IndexWaveform        Comment  +---------+------------------+-----+----------------+--------+ Brachial 133                                             +---------+------------------+-----+----------------+--------+ CFA                              barely triphasic         +---------+------------------+-----+----------------+--------+ Popliteal                       monophasic               +---------+------------------+-----+----------------+--------+ ATA      101               0.75 monophasic               +---------+------------------+-----+----------------+--------+ PTA      102               0.76 monophasic               +---------+------------------+-----+----------------+--------+ PERO     105               0.78 monophasic               +---------+------------------+-----+----------------+--------+ Great Toe52                0.39 Abnormal                 +---------+------------------+-----+----------------+--------+ +---------+------------------+-----+-----------+-------+ Left     Lt Pressure (mmHg)IndexWaveform   Comment +---------+------------------+-----+-----------+-------+ Brachial 134                                       +---------+------------------+-----+-----------+-------+ CFA                             triphasic          +---------+------------------+-----+-----------+-------+ Popliteal                       triphasic          +---------+------------------+-----+-----------+-------+ ATA      117               0.87 multiphasic        +---------+------------------+-----+-----------+-------+ PTA      135               1.01 triphasic          +---------+------------------+-----+-----------+-------+ PERO     92                0.69 multiphasic        +---------+------------------+-----+-----------+-------+  Great Toe89                0.66 Abnormal           +---------+------------------+-----+-----------+-------+ +-------+-----------+-----------+------------+------------+ ABI/TBIToday's ABIToday's TBIPrevious ABIPrevious TBI +-------+-----------+-----------+------------+------------+ Right  .78        .39                                  +-------+-----------+-----------+------------+------------+ Left   1.01       .66                                 +-------+-----------+-----------+------------+------------+  Summary: Right: Resting right ankle-brachial index indicates moderate right lower extremity arterial disease. The right toe-brachial index is abnormal. Left: Resting left ankle-brachial index is within normal range. No evidence of significant left lower extremity arterial disease. The left toe-brachial index is abnormal. *See table(s) above for measurements and observations. Vascular consult recommended. Electronically signed by Quay Burow MD on 04/03/2018 at 4:38:52 PM.    Final    Vas [place] Lower Extremity Arterial Duplex  Result Date: 04/03/2018 LOWER EXTREMITY ARTERIAL DUPLEX STUDY Indications: Patient presents today with complaints of worsening right leg              claudication symptoms for the past 6 months. Cynthia Hobbs states Cynthia Hobbs is              unable to walk to Cynthia Hobbs mailbox and back home without Cynthia Hobbs right leg              hurting. Cynthia Hobbs also c/o how difficult it is to get through Cynthia Hobbs 10              hour daily work schedule with Cynthia Hobbs constantly on Cynthia Hobbs feet and              walking. Cynthia Hobbs has pain in Cynthia Hobbs right leg when resting. High Risk Factors: Hypertension, hyperlipidemia, current smoker.   Current ABI: .78 on the right and 1.01 on the left. Performing Technologist: Sharlett Iles RVT  Examination Guidelines: A complete evaluation includes B-mode imaging, spectral Doppler, color Doppler, and power Doppler as needed of all accessible portions of each vessel. Bilateral testing is considered an integral part of a complete examination. Limited examinations for reoccurring indications may be performed as noted.  Right Duplex Findings: +----------+--------+-----+---------------+----------------------+--------+           PSV cm/sRatioStenosis       Waveform              Comments  +----------+--------+-----+---------------+----------------------+--------+ CFA Prox  84                          triphasic                      +----------+--------+-----+---------------+----------------------+--------+ DFA       101                         triphasic                      +----------+--------+-----+---------------+----------------------+--------+ SFA Prox  75                          biphasic                       +----------+--------+-----+---------------+----------------------+--------+  SFA Mid   123          50-74% stenosismonophasic                                                                                          Mid/Dst                75-99% stenosis                                         369                                                                                                                                                                   monophasic                     +----------+--------+-----+---------------+----------------------+--------+ SFA Distal35                          monophasic                     +----------+--------+-----+---------------+----------------------+--------+ POP Prox  47                          monophasic                     +----------+--------+-----+---------------+----------------------+--------+ POP Mid   18                          biphasic              blunted  +----------+--------+-----+---------------+----------------------+--------+ POP Distal20                          monophasic                     +----------+--------+-----+---------------+----------------------+--------+ ATA Distal10                          biphasic to monophasic         +----------+--------+-----+---------------+----------------------+--------+ PTA Distal14  monophasic                      +----------+--------+-----+---------------+----------------------+--------+ PERO Mid  9                           monophasic                     +----------+--------+-----+---------------+----------------------+--------+ Heterogenous plaque throughout. A focal velocity elevation of 123 cm/s was obtained at mid SFA with a VR of 2.2. Findings are characteristic of 50-74% stenosis. A 2nd focal velocity elevation was visualized, measuring 369 cm/s at mid/distal SFA with post  stenotic turbulence with a VR of 9.0. Findings are characteristic of 75-99% stenosis.  Left Duplex Findings: +----------+--------+-----+--------+---------+--------+           PSV cm/sRatioStenosisWaveform Comments +----------+--------+-----+--------+---------+--------+ CFA Prox  87                   triphasic         +----------+--------+-----+--------+---------+--------+ DFA       71                   triphasic         +----------+--------+-----+--------+---------+--------+ SFA Prox  64                   triphasic         +----------+--------+-----+--------+---------+--------+ SFA Mid   114                  triphasic         +----------+--------+-----+--------+---------+--------+ SFA Distal126                  triphasic         +----------+--------+-----+--------+---------+--------+ POP Prox  140                  triphasic         +----------+--------+-----+--------+---------+--------+ POP Mid   44                   triphasic         +----------+--------+-----+--------+---------+--------+ POP Distal44                   triphasic         +----------+--------+-----+--------+---------+--------+ ATA Distal25                   biphasic          +----------+--------+-----+--------+---------+--------+ PTA Distal33                   triphasic         +----------+--------+-----+--------+---------+--------+ PERO Mid  19                   biphasic           +----------+--------+-----+--------+---------+--------+ Heterogenous plaque throughout without focal stenosis.  Summary: Right: 50-74% stenosis noted in the mid SFA. 75-99 % stenosis in the mid/distal SFA. Left: Normal examination. No evidence of arterial occlusive disease.  See table(s) above for measurements and observations. Vascular consult recommended. Electronically signed by Quay Burow MD on 04/03/2018 at 4:38:28 PM.    Final    Disposition   Pt is being discharged home today in good condition.  Follow-up Plans & Appointments   Follow-up Information    [facility] Follow up on 05/15/2018.   Specialty:  Cardiology Why:  Follow-up Doppler  study 05/15/2018 at 8 AM, 9 AM. Contact information: [address] [place] [phone]       Lorretta Harp, MD Follow up on 05/22/2018.   Specialties:  Cardiology, Radiology Why:  Your follow-up appointment with Dr. Gwenlyn Found will be on 05/22/2018 at 9:45 AM. Contact information: [address] [place] [address] [phone]          Discharge Instructions    Call MD for:  difficulty breathing, headache or visual disturbances   Complete by:  As directed    Call MD for:  extreme fatigue   Complete by:  As directed    Call MD for:  hives   Complete by:  As directed    Call MD for:  persistant dizziness or light-headedness   Complete by:  As directed    Call MD for:  persistant nausea and vomiting   Complete by:  As directed    Call MD for:  redness, tenderness, or signs of infection (pain, swelling, redness, odor or green/yellow discharge around incision site)   Complete by:  As directed    Call MD for:  severe uncontrolled pain   Complete by:  As directed    Call MD for:  temperature >100.4   Complete by:  As directed    Diet - low sodium heart healthy   Complete by:  As directed    Discharge instructions   Complete by:  As directed    No driving for 3 days.  No lifting over 5 lbs for 1 week. No sexual activity for 1 week. Keep procedure site clean & dry. If you notice increased pain, swelling, bleeding or pus, call/return!  You may shower, but no soaking baths/hot tubs/pools for 1 week.   PLEASE DO NOT MISS ANY DOSES OF YOUR PLAVIX!!!!! Also keep a log of you blood pressures and bring back to your follow up appt. Please call the office with any questions.   Patients taking blood thinners should generally stay away from medicines like ibuprofen, Advil, Motrin, naproxen, and Aleve due to risk of stomach bleeding. You may take Tylenol as directed or talk to your primary doctor about alternatives.  Some studies suggest Prilosec/Omeprazole interacts with Plavix. We changed your Prilosec/Omeprazole to the equivalent dose of Protonix for less chance of interaction.   If you notice any bleeding such as blood in stool, black tarry stools, blood in urine, nosebleeds or any other unusual bleeding, call your doctor immediately. It is not normal to have this kind of bleeding while on a blood thinner and usually indicates there is an underlying problem with one of your body systems that needs to be checked out.   Increase activity slowly   Complete by:  As directed      Discharge Medications   Allergies as of 04/30/2018   No Known Allergies     Medication List    STOP taking these medications   ADVIL 200 MG tablet Generic drug:  ibuprofen   omeprazole 20 MG capsule Commonly known as:  PRILOSEC     TAKE these medications   amLODipine 5 MG tablet Commonly known as:  NORVASC Take 5 mg by mouth daily.   aspirin 81 MG EC tablet Take 1 tablet (81 mg total) by mouth daily.   clopidogrel 75 MG tablet Commonly known as:  PLAVIX Take 1 tablet (75 mg total) by mouth daily with breakfast.   pantoprazole 40 MG tablet Commonly known as:  PROTONIX Take 1 tablet (40 mg total) by mouth daily.   rosuvastatin 10 MG tablet Commonly known as:  CRESTOR Take  10 mg by mouth daily.   Vitamin D3 50 MCG (2000 UT) Tabs Take 2,000 Units by mouth daily.        Acute coronary syndrome (MI, NSTEMI, STEMI, etc) this admission?: No.    Outstanding Labs/Studies   Doppler study  Duration of Discharge Encounter   Greater than 30 minutes including physician time.  Signed, Kathyrn Drown, NP 04/30/2018, 9:03 AM  I have examined the patient and reviewed assessment and plan and discussed with patient.  Agree with above as stated.  Walked without problems today.  GEN: Well nourished, well developed, in no acute distress  HEENT: normal  Neck: no JVD, carotid bruits, or masses Cardiac: RRR; no murmurs, rubs, or gallops,no edema  Respiratory:  clear to auscultation bilaterally, normal work of breathing GI: soft, nontender, nondistended,  MS: no deformity or atrophy ; 2+ left femoral, left PT pulse.  1+ right PT pulse; no hematoma Skin: warm and dry, no rash Neuro:  Strength and sensation are intact Psych: euthymic mood, full affect  Continue aspirin plavix along with aggressive secondary prevention. Cynthia Hobbs should avoid tobacco. F/u with Dr. Gwenlyn Found.   Larae Grooms

## 2018-05-06 ENCOUNTER — Other Ambulatory Visit (HOSPITAL_COMMUNITY): Payer: Self-pay | Admitting: Cardiovascular Disease

## 2018-05-06 DIAGNOSIS — I739 Peripheral vascular disease, unspecified: Secondary | ICD-10-CM

## 2018-05-08 NOTE — Progress Notes (Signed)
Transitions of Care Follow Up Call Note  Cynthia Hobbs is an 63 y.o. female who presented to [facility] on 04/29/2018.  The patient had the following prescriptions filled at [facility]: aspirin, clopidogrel, pantoprazole  Patient was called by pharmacist and HIPAA identifiers were verified. The following questions were asked about the prescriptions filled at [facility]:  Has the patient been experiencing any side effects to the medications prescribed? no Understanding of regimen: good Understanding of indications: good Potential of compliance: good  Pharmacist comments: none  [x]  Patient's prescriptions filled at the [facility] were transferred to the following pharmacy: [facility] on [address] []  Patient unable to be reached after calling three times and prescriptions filled at the [facility] were transferred to preferred pharmacy found within their chart.   [facility] 05/08/2018, 6:45 PM Transitions of Care Pharmacy Hours: Monday - Friday 8:30am to 5:00 PM  Phone - [phone]

## 2018-05-15 ENCOUNTER — Other Ambulatory Visit: Payer: Self-pay | Admitting: Cardiovascular Disease

## 2018-05-15 ENCOUNTER — Encounter (HOSPITAL_COMMUNITY): Payer: Self-pay

## 2018-05-15 ENCOUNTER — Ambulatory Visit (HOSPITAL_COMMUNITY): Admission: RE | Admit: 2018-05-15 | Payer: BLUE CROSS/BLUE SHIELD | Source: Ambulatory Visit

## 2018-05-15 ENCOUNTER — Ambulatory Visit (HOSPITAL_COMMUNITY)
Admission: RE | Admit: 2018-05-15 | Discharge: 2018-05-15 | Disposition: A | Discharge status: Home / Self Care | Payer: BLUE CROSS/BLUE SHIELD | Source: Ambulatory Visit | Attending: Cardiology | Admitting: Cardiology

## 2018-05-15 DIAGNOSIS — I739 Peripheral vascular disease, unspecified: Secondary | ICD-10-CM

## 2018-05-15 DIAGNOSIS — Z9862 Peripheral vascular angioplasty status: Secondary | ICD-10-CM | POA: Diagnosis not present

## 2018-05-21 ENCOUNTER — Other Ambulatory Visit: Payer: Self-pay | Admitting: Cardiovascular Disease

## 2018-05-21 DIAGNOSIS — I739 Peripheral vascular disease, unspecified: Secondary | ICD-10-CM

## 2018-05-22 ENCOUNTER — Encounter: Payer: Self-pay | Admitting: Cardiovascular Disease

## 2018-05-22 ENCOUNTER — Ambulatory Visit (INDEPENDENT_AMBULATORY_CARE_PROVIDER_SITE_OTHER): Payer: BLUE CROSS/BLUE SHIELD | Admitting: Cardiovascular Disease

## 2018-05-22 VITALS — BP 110/74 | HR 62 | Ht 62.0 in | Wt 142.4 lb

## 2018-05-22 DIAGNOSIS — I10 Essential (primary) hypertension: Secondary | ICD-10-CM | POA: Diagnosis not present

## 2018-05-22 DIAGNOSIS — E782 Mixed hyperlipidemia: Secondary | ICD-10-CM | POA: Diagnosis not present

## 2018-05-22 DIAGNOSIS — Z720 Tobacco use: Secondary | ICD-10-CM | POA: Diagnosis not present

## 2018-05-22 DIAGNOSIS — I739 Peripheral vascular disease, unspecified: Secondary | ICD-10-CM | POA: Diagnosis not present

## 2018-05-22 LAB — LIPID PANEL
CHOLESTEROL TOTAL: 133 mg/dL (ref 100–199)
Chol/HDL Ratio: 2.4 ratio (ref 0.0–4.4)
HDL: 56 mg/dL (ref >39)
LDL Calculated: 64 mg/dL (ref 0–99)
Triglycerides: 65 mg/dL (ref 0–149)
VLDL Cholesterol Cal: 13 mg/dL (ref 5–40)

## 2018-05-22 LAB — HEPATIC FUNCTION PANEL
ALT: 16 IU/L (ref 0–32)
AST: 26 IU/L (ref 0–40)
Albumin: 4.4 g/dL (ref 3.8–4.8)
Alkaline Phosphatase: 62 IU/L (ref 39–117)
Bilirubin Total: 0.4 mg/dL (ref 0.0–1.2)
Bilirubin, Direct: 0.13 mg/dL (ref 0.00–0.40)
Total Protein: 7.1 g/dL (ref 6.0–8.5)

## 2018-05-22 NOTE — Assessment & Plan Note (Signed)
History of peripheral arterial disease status post right SFA directional atherectomy followed by drug-eluting balloon angioplasty 04/29/2018 of high-grade mid right SFA stenoses.  She did have a 40% mid left SFA stenosis.  Her follow-up Dopplers performed on 05/15/2018 showed complete normalization.  Her symptoms have resolved.  She remains on aspirin and Plavix.  We will get lower extremity arterial Doppler studies every 6 months.  I will see her back in 1 year for follow-up.

## 2018-05-22 NOTE — Assessment & Plan Note (Signed)
History of essential hypertension with blood pressure measured today at 110/74.  She is on amlodipine.

## 2018-05-22 NOTE — Patient Instructions (Signed)
Medication Instructions:  Your physician recommends that you continue on your current medications as directed. Please refer to the Current Medication list given to you today.  If you need a refill on your cardiac medications before your next appointment, please call your pharmacy.   Lab work: Your physician recommends that you return for lab work TODAY  If you have labs (blood work) drawn today and your tests are completely normal, you will receive your results only by: Marland Kitchen MyChart Message (if you have MyChart) OR . A paper copy in the mail If you have any lab test that is abnormal or we need to change your treatment, we will call you to review the results.  Testing/Procedures: Your physician has requested that you have a lower or upper extremity arterial duplex. This test is an ultrasound of the arteries in the legs or arms. It looks at arterial blood flow in the legs and arms. Allow one hour for Lower and Upper Arterial scans. There are no restrictions or special instructions SCHEDULE IN 6 MONTHS  Your physician has requested that you have an ankle brachial index (ABI). During this test an ultrasound and blood pressure cuff are used to evaluate the arteries that supply the arms and legs with blood. Allow thirty minutes for this exam. There are no restrictions or special instructions. SCHEDULE IN 6 MONTHS   Follow-Up: At [facility], you and your health needs are our priority.  As part of our continuing mission to provide you with exceptional heart care, we have created designated Provider Care Teams.  These Care Teams include your primary Cardiologist (physician) and Advanced Practice Providers (APPs -  Physician Assistants and Nurse Practitioners) who all work together to provide you with the care you need, when you need it. . You will need a follow up appointment in 12 months.  Please call our office 2 months in advance to schedule this appointment.  You may see Dr. Allyson Sabal or one of the  following Advanced Practice Providers on your designated Care Team:   . Corine Shelter, [place] . Azalee Course, PA-C . Micah Flesher, PA-C . Joni Reining, DNP . Theodore Demark, PA-C . Judy Pimple, PA-C . Marjie Skiff, PA-C

## 2018-05-22 NOTE — Assessment & Plan Note (Signed)
History of hyperlipidemia on low-dose Crestor with lipid profile performed 01/17/2017 revealing total cholesterol 196, LDL of 120 and HDL of 62.

## 2018-05-22 NOTE — Progress Notes (Signed)
05/22/2018 Cynthia Hobbs   13-Jun-1955  [id number]  Primary Physician Merri Brunette, MD Primary Cardiologist: Runell Gess MD Nicholes Calamity, [place]  HPI:  Cynthia Hobbs is a 63 y.o.  thin appearing divorced Caucasian female mother of 2, grandmother of 2 grandchildren who is accompanied by her mother Cynthia Hobbs who is also a patient of mine. She was referred by Dr. Merri Brunette for cardiac evaluation because of coronary calcification found on screening chest CT. I last saw her in the office 04/24/2018.Risk factors include 40 pack years of tobacco use, treated hypertension and hyperlipidemia. Mother did have carotid artery disease with endarterectomy recently. She is never had a heart attack or stroke. She denies chest pain or shortness of breath patient does complain of right lower extremity claudication however.  Since I saw her she has had a Myoview stress test performed 03/27/2018 which was entirely normal and lower extremity Dopplers performed 04/03/2018 which revealed a right ABI of 0.78 and left of 1.01 with a high-frequency signal in her mid right SFA.  Based on this, she wishes to proceed with outpatient angiography potential endovascular therapy.  I performed peripheral angiography on her 04/29/2018 revealing high-grade tandem mid right SFA stenoses.  She underwent Hawk 1 directional atherectomy followed by drug-coated balloon angioplasty.  Her follow-up Doppler studies performed 05/15/2018 showed complete normalization of her ABI and Doppler.  Her symptoms have since resolved.  Current Meds  Medication Sig  . amLODipine (NORVASC) 5 MG tablet Take 5 mg by mouth daily.  Marland Kitchen aspirin EC 81 MG EC tablet Take 1 tablet (81 mg total) by mouth daily.  . Cholecalciferol (VITAMIN D3) 50 MCG (2000 UT) TABS Take 2,000 Units by mouth daily.  . clopidogrel (PLAVIX) 75 MG tablet Take 1 tablet (75 mg total) by mouth daily with breakfast.  . pantoprazole (PROTONIX) 40 MG tablet Take 1 tablet (40 mg  total) by mouth daily.  . rosuvastatin (CRESTOR) 10 MG tablet Take 10 mg by mouth daily.     No Known Allergies  Social History   Socioeconomic History  . Marital status: Single    Spouse name: Not on file  . Number of children: Not on file  . Years of education: Not on file  . Highest education level: Not on file  Occupational History  . Not on file  Social Needs  . Financial resource strain: Not on file  . Food insecurity:    Worry: Not on file    Inability: Not on file  . Transportation needs:    Medical: Not on file    Non-medical: Not on file  Tobacco Use  . Smoking status: Current Every Day Smoker    Types: Cigarettes  . Smokeless tobacco: Never Used  Substance and Sexual Activity  . Alcohol use: No  . Drug use: No  . Sexual activity: Not on file  Lifestyle  . Physical activity:    Days per week: Not on file    Minutes per session: Not on file  . Stress: Not on file  Relationships  . Social connections:    Talks on phone: Not on file    Gets together: Not on file    Attends religious service: Not on file    Active member of club or organization: Not on file    Attends meetings of clubs or organizations: Not on file    Relationship status: Not on file  . Intimate partner violence:    Fear of  current or ex partner: Not on file    Emotionally abused: Not on file    Physically abused: Not on file    Forced sexual activity: Not on file  Other Topics Concern  . Not on file  Social History Narrative  . Not on file     Review of Systems: General: negative for chills, fever, night sweats or weight changes.  Cardiovascular: negative for chest pain, dyspnea on exertion, edema, orthopnea, palpitations, paroxysmal nocturnal dyspnea or shortness of breath Dermatological: negative for rash Respiratory: negative for cough or wheezing Urologic: negative for hematuria Abdominal: negative for nausea, vomiting, diarrhea, bright red blood per rectum, melena, or  hematemesis Neurologic: negative for visual changes, syncope, or dizziness All other systems reviewed and are otherwise negative except as noted above.    Blood pressure 110/74, pulse 62, height 5\' 2"  (1.575 m), weight 142 lb 6.4 oz (64.6 kg), SpO2 95 %.  General appearance: alert and no distress Neck: no adenopathy, no carotid bruit, no JVD, supple, symmetrical, trachea midline and thyroid not enlarged, symmetric, no tenderness/mass/nodules Lungs: clear to auscultation bilaterally Heart: regular rate and rhythm, S1, S2 normal, no murmur, click, rub or gallop Extremities: extremities normal, atraumatic, no cyanosis or edema Pulses: 2+ and symmetric Skin: Skin color, texture, turgor normal. No rashes or lesions Neurologic: Alert and oriented X 3, normal strength and tone. Normal symmetric reflexes. Normal coordination and gait  EKG not performed today  ASSESSMENT AND PLAN:   Hyperlipidemia History of hyperlipidemia on low-dose Crestor with lipid profile performed 01/17/2017 revealing total cholesterol 196, LDL of 120 and HDL of 62.  Tobacco abuse History of tobacco abuse currently only smoking 1 cigarette a day.  We talked about the importance of complete smoking cessation.  Claudication in peripheral vascular disease (HCC) History of peripheral arterial disease status post right SFA directional atherectomy followed by drug-eluting balloon angioplasty 04/29/2018 of high-grade mid right SFA stenoses.  She did have a 40% mid left SFA stenosis.  Her follow-up Dopplers performed on 05/15/2018 showed complete normalization.  Her symptoms have resolved.  She remains on aspirin and Plavix.  We will get lower extremity arterial Doppler studies every 6 months.  I will see her back in 1 year for follow-up.  Essential hypertension History of essential hypertension with blood pressure measured today at 110/74.  She is on amlodipine.      Runell Gess MD FACP,FACC,FAHA,  [facility] 05/22/2018 10:20 AM

## 2018-05-22 NOTE — Assessment & Plan Note (Signed)
History of tobacco abuse currently only smoking 1 cigarette a day.  We talked about the importance of complete smoking cessation.

## 2018-08-07 ENCOUNTER — Telehealth: Payer: Self-pay | Admitting: *Deleted

## 2018-08-07 NOTE — Telephone Encounter (Signed)
I spoke with Cynthia Hobbs this morning, to schedule her office visit.She said due to not working at this time she didn't want to run up a bill and ask if she still needed to come for her lea's 11/27/18.I told her I would forward a message to the nurse.

## 2018-08-08 NOTE — Telephone Encounter (Signed)
We can wait until February of next year to repeat her LEAs

## 2018-08-13 ENCOUNTER — Other Ambulatory Visit: Payer: Self-pay

## 2018-08-13 ENCOUNTER — Emergency Department (HOSPITAL_COMMUNITY): Payer: BLUE CROSS/BLUE SHIELD

## 2018-08-13 ENCOUNTER — Inpatient Hospital Stay (HOSPITAL_COMMUNITY)
Admission: EM | Admit: 2018-08-13 | Discharge: 2018-08-17 | DRG: 481 | Disposition: A | Discharge status: Home Health | Payer: BLUE CROSS/BLUE SHIELD | Attending: Internal Medicine | Admitting: Internal Medicine

## 2018-08-13 ENCOUNTER — Encounter (HOSPITAL_COMMUNITY): Payer: Self-pay | Admitting: Emergency Medicine

## 2018-08-13 DIAGNOSIS — Z1159 Encounter for screening for other viral diseases: Secondary | ICD-10-CM

## 2018-08-13 DIAGNOSIS — S42202A Unspecified fracture of upper end of left humerus, initial encounter for closed fracture: Secondary | ICD-10-CM | POA: Diagnosis present

## 2018-08-13 DIAGNOSIS — D62 Acute posthemorrhagic anemia: Secondary | ICD-10-CM | POA: Diagnosis not present

## 2018-08-13 DIAGNOSIS — T148XXA Other injury of unspecified body region, initial encounter: Secondary | ICD-10-CM

## 2018-08-13 DIAGNOSIS — R609 Edema, unspecified: Secondary | ICD-10-CM | POA: Diagnosis not present

## 2018-08-13 DIAGNOSIS — S42212A Unspecified displaced fracture of surgical neck of left humerus, initial encounter for closed fracture: Secondary | ICD-10-CM

## 2018-08-13 DIAGNOSIS — M25572 Pain in left ankle and joints of left foot: Secondary | ICD-10-CM | POA: Diagnosis not present

## 2018-08-13 DIAGNOSIS — W19XXXA Unspecified fall, initial encounter: Secondary | ICD-10-CM

## 2018-08-13 DIAGNOSIS — S99912A Unspecified injury of left ankle, initial encounter: Secondary | ICD-10-CM | POA: Diagnosis not present

## 2018-08-13 DIAGNOSIS — Z79899 Other long term (current) drug therapy: Secondary | ICD-10-CM | POA: Diagnosis not present

## 2018-08-13 DIAGNOSIS — F17210 Nicotine dependence, cigarettes, uncomplicated: Secondary | ICD-10-CM | POA: Diagnosis present

## 2018-08-13 DIAGNOSIS — R0602 Shortness of breath: Secondary | ICD-10-CM | POA: Diagnosis not present

## 2018-08-13 DIAGNOSIS — I10 Essential (primary) hypertension: Secondary | ICD-10-CM | POA: Diagnosis present

## 2018-08-13 DIAGNOSIS — S72145A Nondisplaced intertrochanteric fracture of left femur, initial encounter for closed fracture: Secondary | ICD-10-CM | POA: Diagnosis present

## 2018-08-13 DIAGNOSIS — Z7982 Long term (current) use of aspirin: Secondary | ICD-10-CM | POA: Diagnosis not present

## 2018-08-13 DIAGNOSIS — Y92020 Kitchen in mobile home as the place of occurrence of the external cause: Secondary | ICD-10-CM

## 2018-08-13 DIAGNOSIS — Z7902 Long term (current) use of antithrombotics/antiplatelets: Secondary | ICD-10-CM

## 2018-08-13 DIAGNOSIS — Z8249 Family history of ischemic heart disease and other diseases of the circulatory system: Secondary | ICD-10-CM

## 2018-08-13 DIAGNOSIS — S42292D Other displaced fracture of upper end of left humerus, subsequent encounter for fracture with routine healing: Secondary | ICD-10-CM | POA: Diagnosis not present

## 2018-08-13 DIAGNOSIS — S299XXA Unspecified injury of thorax, initial encounter: Secondary | ICD-10-CM | POA: Diagnosis not present

## 2018-08-13 DIAGNOSIS — G8918 Other acute postprocedural pain: Secondary | ICD-10-CM | POA: Diagnosis not present

## 2018-08-13 DIAGNOSIS — S72142A Displaced intertrochanteric fracture of left femur, initial encounter for closed fracture: Secondary | ICD-10-CM | POA: Diagnosis not present

## 2018-08-13 DIAGNOSIS — W08XXXA Fall from other furniture, initial encounter: Secondary | ICD-10-CM | POA: Diagnosis present

## 2018-08-13 DIAGNOSIS — S42352A Displaced comminuted fracture of shaft of humerus, left arm, initial encounter for closed fracture: Secondary | ICD-10-CM | POA: Diagnosis not present

## 2018-08-13 DIAGNOSIS — R0902 Hypoxemia: Secondary | ICD-10-CM | POA: Diagnosis not present

## 2018-08-13 DIAGNOSIS — R52 Pain, unspecified: Secondary | ICD-10-CM | POA: Diagnosis not present

## 2018-08-13 DIAGNOSIS — M25552 Pain in left hip: Secondary | ICD-10-CM | POA: Diagnosis not present

## 2018-08-13 DIAGNOSIS — Z419 Encounter for procedure for purposes other than remedying health state, unspecified: Secondary | ICD-10-CM

## 2018-08-13 DIAGNOSIS — S92002A Unspecified fracture of left calcaneus, initial encounter for closed fracture: Secondary | ICD-10-CM

## 2018-08-13 DIAGNOSIS — E785 Hyperlipidemia, unspecified: Secondary | ICD-10-CM | POA: Diagnosis not present

## 2018-08-13 DIAGNOSIS — S92015A Nondisplaced fracture of body of left calcaneus, initial encounter for closed fracture: Secondary | ICD-10-CM | POA: Diagnosis not present

## 2018-08-13 DIAGNOSIS — I739 Peripheral vascular disease, unspecified: Secondary | ICD-10-CM | POA: Diagnosis present

## 2018-08-13 DIAGNOSIS — S92012A Displaced fracture of body of left calcaneus, initial encounter for closed fracture: Secondary | ICD-10-CM | POA: Diagnosis not present

## 2018-08-13 DIAGNOSIS — S72142D Displaced intertrochanteric fracture of left femur, subsequent encounter for closed fracture with routine healing: Secondary | ICD-10-CM | POA: Diagnosis not present

## 2018-08-13 DIAGNOSIS — M7989 Other specified soft tissue disorders: Secondary | ICD-10-CM | POA: Diagnosis not present

## 2018-08-13 DIAGNOSIS — Z03818 Encounter for observation for suspected exposure to other biological agents ruled out: Secondary | ICD-10-CM | POA: Diagnosis not present

## 2018-08-13 LAB — CBC
HCT: 41.5 % (ref 36.0–46.0)
Hemoglobin: 13.3 g/dL (ref 12.0–15.0)
MCH: 28.4 pg (ref 26.0–34.0)
MCHC: 32 g/dL (ref 30.0–36.0)
MCV: 88.7 fL (ref 80.0–100.0)
Platelets: 257 10*3/uL (ref 150–400)
RBC: 4.68 MIL/uL (ref 3.87–5.11)
RDW: 12.1 % (ref 11.5–15.5)
WBC: 11.9 10*3/uL — ABNORMAL HIGH (ref 4.0–10.5)
nRBC: 0 % (ref 0.0–0.2)

## 2018-08-13 LAB — BASIC METABOLIC PANEL
Anion gap: 8 (ref 5–15)
BUN: 21 mg/dL (ref 8–23)
CO2: 23 mmol/L (ref 22–32)
Calcium: 8.7 mg/dL — ABNORMAL LOW (ref 8.9–10.3)
Chloride: 109 mmol/L (ref 98–111)
Creatinine, Ser: 0.8 mg/dL (ref 0.44–1.00)
GFR calc Af Amer: >60 mL/min (ref >60)
GFR calc non Af Amer: >60 mL/min (ref >60)
Glucose, Bld: 121 mg/dL — ABNORMAL HIGH (ref 70–99)
Potassium: 3.8 mmol/L (ref 3.5–5.1)
Sodium: 140 mmol/L (ref 135–145)

## 2018-08-13 MED ORDER — HYDROMORPHONE HCL 1 MG/ML IJ SOLN
1.0000 mg | Freq: Once | INTRAMUSCULAR | Status: AC
  Administered 2018-08-14: 1 mg via INTRAVENOUS
  Filled 2018-08-13: qty 1

## 2018-08-13 MED ORDER — ONDANSETRON HCL 4 MG/2ML IJ SOLN
4.0000 mg | Freq: Once | INTRAMUSCULAR | Status: AC
  Administered 2018-08-13: 4 mg via INTRAVENOUS
  Filled 2018-08-13: qty 2

## 2018-08-13 MED ORDER — FENTANYL CITRATE (PF) 100 MCG/2ML IJ SOLN
50.0000 ug | Freq: Once | INTRAMUSCULAR | Status: AC
  Administered 2018-08-13: 50 ug via INTRAVENOUS
  Filled 2018-08-13: qty 2

## 2018-08-13 NOTE — ED Notes (Signed)
[place]- Brother-  [phone]   Ishmael Holter- Mom- [phone]

## 2018-08-13 NOTE — ED Provider Notes (Addendum)
[facility] EMERGENCY DEPARTMENT Provider Note   CSN: [id number] Arrival date & time: 08/13/18  2158    History   Chief Complaint Chief Complaint  Patient presents with  . Fall  . Ankle Pain    HPI ADELENE POLIVKA is a 63 y.o. female.     63 y/o female with hx of GERD and PAD (on chronic Plavix) presents to the emergency department following a fall.  She states that she was standing on her coffee table trying to dust her ceiling fan when the table tipped over and she fell on her left side.  She had no head trauma or loss of consciousness.  Has been experiencing most of her pain to her left shoulder, left hip, left ankle.  She has not been weightbearing since the incident denies taking any medications prior to arrival for discomfort.  Her pain is been constant, unchanged, worse with movement of her shoulder and left lower extremity.  Denies numbness, paresthesias, nausea, vomiting.     Past Medical History:  Diagnosis Date  . GERD (gastroesophageal reflux disease)   . PAD (peripheral artery disease) [facility])     Patient Active Problem List   Diagnosis Date Noted  . Coronary artery calcification seen on CAT scan 03/06/2018  . Hyperlipidemia 03/06/2018  . Tobacco abuse 03/06/2018  . Claudication in peripheral vascular disease (HCC) 03/06/2018  . Essential hypertension 03/06/2018    Past Surgical History:  Procedure Laterality Date  . ABDOMINAL AORTOGRAM W/LOWER EXTREMITY Bilateral 04/29/2018   Procedure: ABDOMINAL AORTOGRAM W/LOWER EXTREMITY;  Surgeon: Runell Gess, MD;  Location: [facility] INVASIVE CV LAB;  Service: Cardiovascular;  Laterality: Bilateral;  . BACK SURGERY    . BREAST EXCISIONAL BIOPSY Right    benign  . PERIPHERAL VASCULAR ATHERECTOMY Right 04/29/2018   Procedure: PERIPHERAL VASCULAR ATHERECTOMY;  Surgeon: Runell Gess, MD;  Location: [facility] INVASIVE CV LAB;  Service: Cardiovascular;  Laterality: Right;  SFA     OB History   No obstetric  history on file.      Home Medications    Prior to Admission medications   Medication Sig Start Date End Date Taking? Authorizing Provider  amLODipine (NORVASC) 5 MG tablet Take 5 mg by mouth daily.    [provider]  aspirin EC 81 MG EC tablet Take 1 tablet (81 mg total) by mouth daily. 04/30/18   Georgie Chard D, NP  Cholecalciferol (VITAMIN D3) 50 MCG (2000 UT) TABS Take 2,000 Units by mouth daily.    [provider]  clopidogrel (PLAVIX) 75 MG tablet Take 1 tablet (75 mg total) by mouth daily with breakfast. 04/30/18   Georgie Chard D, NP  pantoprazole (PROTONIX) 40 MG tablet Take 1 tablet (40 mg total) by mouth daily. 04/30/18   Georgie Chard D, NP  rosuvastatin (CRESTOR) 10 MG tablet Take 10 mg by mouth daily.    [provider]    Family History Family History  Problem Relation Age of Onset  . Hypertension Mother   . Dementia Mother   . Cancer Father     Social History Social History   Tobacco Use  . Smoking status: Current Every Day Smoker    Types: Cigarettes  . Smokeless tobacco: Never Used  Substance Use Topics  . Alcohol use: No  . Drug use: No     Allergies   Patient has no known allergies.   Review of Systems Review of Systems Ten systems reviewed and are negative for acute change,  except as noted in the HPI.    Physical Exam Updated Vital Signs BP (!) 131/94 (BP Location: Right Arm)   Pulse 96   Temp 99 F (37.2 C) (Oral)   Resp 20   SpO2 96%   Physical Exam Vitals signs and nursing note reviewed.  Constitutional:      General: She is not in acute distress.    Appearance: She is well-developed. She is not diaphoretic.     Comments: Nontoxic appearing and in NAD  HENT:     Head: Normocephalic and atraumatic.     Comments: No head trauma, battle's sign, raccoon's eyes. Eyes:     General: No scleral icterus.    Conjunctiva/sclera: Conjunctivae normal.  Neck:     Musculoskeletal: Normal range of motion.   Cardiovascular:     Rate and Rhythm: Normal rate and regular rhythm.     Pulses: Normal pulses.     Comments: Distal radial and DP pulse 2+ bilaterally Pulmonary:     Effort: Pulmonary effort is normal. No respiratory distress.     Comments: Respirations even and unlabored Musculoskeletal: Normal range of motion.     Comments: There is TTP to the anterior L shoulder with deformity. Minimal ROM 2/2 pain. No TTP, deformity, or crepitus at or distal to the L elbow.  No leg shortening or malrotation of the LLE. Little focal TTP to the left hip, but no crepitus. Patient states unable to perform AROM of the LLE secondary to pain.  There is TTP and swelling to the L ankle (b/l malleolus) without deformity. Compartments of the LLE are soft and compressible.   Skin:    General: Skin is warm and dry.     Coloration: Skin is not pale.     Findings: No erythema or rash.  Neurological:     General: No focal deficit present.     Mental Status: She is alert and oriented to person, place, and time.     Coordination: Coordination normal.     Comments: GCS 15. Speech is goal oriented. Patient has equal grip strength bilaterally with preserved strength against resistance of the L wrist with flexion and extension. Sensation to light touch intact and equal throughout. Patient able to wiggle all toes.  Psychiatric:        Behavior: Behavior normal.      ED Treatments / Results  Labs (all labs ordered are listed, but only abnormal results are displayed) Labs Reviewed  CBC - Abnormal; Notable for the following components:      Result Value   WBC 11.9 (*)    All other components within normal limits  BASIC METABOLIC PANEL - Abnormal; Notable for the following components:   Glucose, Bld 121 (*)    Calcium 8.7 (*)    All other components within normal limits  SARS CORONAVIRUS 2 (HOSPITAL ORDER, PERFORMED IN [facility])    EKG None  Radiology Dg Ankle Complete Left  Result Date:  08/13/2018 CLINICAL DATA:  Recent fall with left ankle pain, initial encounter EXAM: LEFT ANKLE COMPLETE - 3+ VIEW COMPARISON:  None. FINDINGS: Soft tissue swelling is noted primarily medially. Some increased density is noted in the midportion of the calcaneus suspicious for an impacted fracture. Calcaneal spurring is noted. No other definitive fracture is seen. IMPRESSION: Changes suspicious for calcaneal fracture. CT may be helpful for further evaluation as clinically indicated. Electronically Signed   By: Alcide CleverMark  Lukens M.D.   On: 08/13/2018 23:16  Dg Chest Port 1 View  Result Date: 08/14/2018 CLINICAL DATA:  Left shoulder and hip pain following fall, initial encounter EXAM: PORTABLE CHEST 1 VIEW COMPARISON:  02/06/2018 FINDINGS: Cardiac shadows within normal limits. Lungs are well aerated bilaterally. Comminuted left proximal humeral fracture is again noted. The underlying bony thorax is within normal limits. IMPRESSION: No active disease. Electronically Signed   By: Alcide Clever M.D.   On: 08/14/2018 00:04   Dg Shoulder Left  Result Date: 08/13/2018 CLINICAL DATA:  Recent fall with left shoulder pain EXAM: LEFT SHOULDER - 2+ VIEW COMPARISON:  None. FINDINGS: Comminuted fracture of the proximal left humerus is seen with impaction at the fracture site. Fracture site is primarily at the surgical neck. The underlying bony thorax is within normal limits. Degenerative changes of the acromioclavicular joint are seen. IMPRESSION: Comminuted proximal left humeral fracture. Electronically Signed   By: Alcide Clever M.D.   On: 08/13/2018 23:17   Dg Humerus Left  Result Date: 08/13/2018 CLINICAL DATA:  Recent fall with left shoulder pain, initial encounter EXAM: LEFT HUMERUS - 2+ VIEW COMPARISON:  None. FINDINGS: Comminuted proximal left humeral fracture is noted with impaction at the fracture site. The fracture predominantly involves the surgical neck. The underlying bony thorax is within normal limits.  Degenerative changes of the acromioclavicular joint are noted. IMPRESSION: Comminuted proximal left humeral fracture. Electronically Signed   By: Alcide Clever M.D.   On: 08/13/2018 23:12   Dg Hip Unilat W Or Wo Pelvis 2-3 Views Left  Result Date: 08/13/2018 CLINICAL DATA:  Recent fall with pelvic pain, initial encounter EXAM: DG HIP (WITH OR WITHOUT PELVIS) 2-3V LEFT COMPARISON:  None. FINDINGS: Pelvic ring is intact. Undisplaced intratrochanteric left femoral fracture is seen. No soft tissue abnormality is noted. IMPRESSION: Undisplaced left intratrochanteric femoral fracture. Electronically Signed   By: Alcide Clever M.D.   On: 08/13/2018 23:15    Procedures Procedures (including critical care time)  Medications Ordered in ED Medications  HYDROmorphone (DILAUDID) injection 1 mg (has no administration in time range)  fentaNYL (SUBLIMAZE) injection 50 mcg (50 mcg Intravenous Given 08/13/18 2325)  ondansetron (ZOFRAN) injection 4 mg (4 mg Intravenous Given 08/13/18 2324)     Initial Impression / Assessment and Plan / ED Course  I have reviewed the triage vital signs and the nursing notes.  Pertinent labs & imaging results that were available during my care of the patient were reviewed by me and considered in my medical decision making (see chart for details).        51:31 PM 63 year old female presents to the emergency department for evaluation following a fall off of a coffee table.  She fell on her left side and is having pain to her left shoulder as well as her left hip and ankle.  She is neurovascularly intact on exam.  Will obtain x-rays.  Fentanyl ordered for pain.  The patient is solely on Plavix for anticoagulation.  12:00 AM Patient updated on imaging.  On repeat exam of her left lower extremity, she does have some bruising around her posterior ankle and foot.  Mild tenderness to the heel.  We will proceed with CT of the foot to assess definitively for calcaneal fracture.  She  will be placed in a bulky Jones splint as a precaution.  Also ordered to be placed in a shoulder immobilizer for management of comminuted proximal humeral fracture.  Consult placed to orthopedics to discuss aforementioned injuries as well as nondisplaced left intertrochanteric fracture.  Will require admission to the hospitalist service.  12:36 AM Case discussed with Dr. Dion Saucier of orthopedics.  Their service will be by to see the patient in the morning.  Requests she remain n.p.o.  Consult placed to [facility] for admission.  12:57 AM Dr. Julian Reil to admit.   Final Clinical Impressions(s) / ED Diagnoses   Final diagnoses:  Closed intertrochanteric fracture, left, initial encounter [facility])  Closed fracture of neck of left humerus, initial encounter  Closed nondisplaced fracture of left calcaneus, unspecified portion of calcaneus, initial encounter  Fall, initial encounter    ED Discharge Orders    None       Antony Madura, PA-C 08/14/18 0057    Antony Madura, PA-C 08/14/18 0058    Nira Conn, MD 08/14/18 [phone]

## 2018-08-13 NOTE — ED Triage Notes (Addendum)
Pt via [facility] with c/o fall, she reports standing on at table dusting when the table tipped. No LOC, fell onto her left side. Pain on the left ankle, left hip, left humerus and lower back. No shortening or rotation of the hip but she is guarding the left arm. She is on blood thinner for PVD. A/O at time of triage.

## 2018-08-13 NOTE — ED Notes (Signed)
ED Provider at bedside. 

## 2018-08-14 ENCOUNTER — Inpatient Hospital Stay (HOSPITAL_COMMUNITY): Payer: BLUE CROSS/BLUE SHIELD | Admitting: Anesthesiology

## 2018-08-14 ENCOUNTER — Encounter (HOSPITAL_COMMUNITY): Payer: Self-pay | Admitting: Certified Registered Nurse Anesthetist

## 2018-08-14 ENCOUNTER — Encounter (HOSPITAL_COMMUNITY)
Admission: EM | Disposition: A | Discharge status: Home Health | Payer: Self-pay | Source: Home / Self Care | Attending: Internal Medicine

## 2018-08-14 ENCOUNTER — Other Ambulatory Visit: Payer: Self-pay

## 2018-08-14 ENCOUNTER — Inpatient Hospital Stay (HOSPITAL_COMMUNITY): Payer: BLUE CROSS/BLUE SHIELD

## 2018-08-14 ENCOUNTER — Emergency Department (HOSPITAL_COMMUNITY): Payer: BLUE CROSS/BLUE SHIELD

## 2018-08-14 DIAGNOSIS — S92002A Unspecified fracture of left calcaneus, initial encounter for closed fracture: Secondary | ICD-10-CM | POA: Diagnosis not present

## 2018-08-14 DIAGNOSIS — W08XXXA Fall from other furniture, initial encounter: Secondary | ICD-10-CM | POA: Diagnosis present

## 2018-08-14 DIAGNOSIS — F17210 Nicotine dependence, cigarettes, uncomplicated: Secondary | ICD-10-CM | POA: Diagnosis not present

## 2018-08-14 DIAGNOSIS — I10 Essential (primary) hypertension: Secondary | ICD-10-CM

## 2018-08-14 DIAGNOSIS — D62 Acute posthemorrhagic anemia: Secondary | ICD-10-CM | POA: Diagnosis not present

## 2018-08-14 DIAGNOSIS — I739 Peripheral vascular disease, unspecified: Secondary | ICD-10-CM | POA: Diagnosis not present

## 2018-08-14 DIAGNOSIS — Y92020 Kitchen in mobile home as the place of occurrence of the external cause: Secondary | ICD-10-CM | POA: Diagnosis not present

## 2018-08-14 DIAGNOSIS — W19XXXA Unspecified fall, initial encounter: Secondary | ICD-10-CM

## 2018-08-14 DIAGNOSIS — E785 Hyperlipidemia, unspecified: Secondary | ICD-10-CM | POA: Diagnosis not present

## 2018-08-14 DIAGNOSIS — Z8249 Family history of ischemic heart disease and other diseases of the circulatory system: Secondary | ICD-10-CM | POA: Diagnosis not present

## 2018-08-14 DIAGNOSIS — S72145A Nondisplaced intertrochanteric fracture of left femur, initial encounter for closed fracture: Secondary | ICD-10-CM | POA: Diagnosis present

## 2018-08-14 DIAGNOSIS — S72002A Fracture of unspecified part of neck of left femur, initial encounter for closed fracture: Secondary | ICD-10-CM

## 2018-08-14 DIAGNOSIS — S42212A Unspecified displaced fracture of surgical neck of left humerus, initial encounter for closed fracture: Secondary | ICD-10-CM | POA: Diagnosis not present

## 2018-08-14 DIAGNOSIS — Z79899 Other long term (current) drug therapy: Secondary | ICD-10-CM | POA: Diagnosis not present

## 2018-08-14 DIAGNOSIS — S72142A Displaced intertrochanteric fracture of left femur, initial encounter for closed fracture: Secondary | ICD-10-CM | POA: Diagnosis not present

## 2018-08-14 DIAGNOSIS — Z7982 Long term (current) use of aspirin: Secondary | ICD-10-CM | POA: Diagnosis not present

## 2018-08-14 DIAGNOSIS — R0602 Shortness of breath: Secondary | ICD-10-CM | POA: Diagnosis not present

## 2018-08-14 DIAGNOSIS — Z7902 Long term (current) use of antithrombotics/antiplatelets: Secondary | ICD-10-CM | POA: Diagnosis not present

## 2018-08-14 DIAGNOSIS — S72142D Displaced intertrochanteric fracture of left femur, subsequent encounter for closed fracture with routine healing: Secondary | ICD-10-CM | POA: Diagnosis not present

## 2018-08-14 DIAGNOSIS — S92012A Displaced fracture of body of left calcaneus, initial encounter for closed fracture: Secondary | ICD-10-CM | POA: Diagnosis not present

## 2018-08-14 DIAGNOSIS — M25552 Pain in left hip: Secondary | ICD-10-CM | POA: Diagnosis present

## 2018-08-14 DIAGNOSIS — S42202A Unspecified fracture of upper end of left humerus, initial encounter for closed fracture: Secondary | ICD-10-CM | POA: Diagnosis not present

## 2018-08-14 DIAGNOSIS — S299XXA Unspecified injury of thorax, initial encounter: Secondary | ICD-10-CM | POA: Diagnosis not present

## 2018-08-14 DIAGNOSIS — S42292D Other displaced fracture of upper end of left humerus, subsequent encounter for fracture with routine healing: Secondary | ICD-10-CM | POA: Diagnosis not present

## 2018-08-14 DIAGNOSIS — Z1159 Encounter for screening for other viral diseases: Secondary | ICD-10-CM | POA: Diagnosis not present

## 2018-08-14 DIAGNOSIS — S42352A Displaced comminuted fracture of shaft of humerus, left arm, initial encounter for closed fracture: Secondary | ICD-10-CM | POA: Diagnosis not present

## 2018-08-14 DIAGNOSIS — G8918 Other acute postprocedural pain: Secondary | ICD-10-CM | POA: Diagnosis not present

## 2018-08-14 HISTORY — PX: ORIF HUMERUS FRACTURE: SHX2126

## 2018-08-14 HISTORY — PX: INTRAMEDULLARY (IM) NAIL INTERTROCHANTERIC: SHX5875

## 2018-08-14 HISTORY — PX: CAST APPLICATION: SHX380

## 2018-08-14 LAB — TYPE AND SCREEN
ABO/RH(D): A POS
Antibody Screen: NEGATIVE

## 2018-08-14 LAB — SURGICAL PCR SCREEN
MRSA, PCR: NEGATIVE
Staphylococcus aureus: NEGATIVE

## 2018-08-14 LAB — ABO/RH: ABO/RH(D): A POS

## 2018-08-14 LAB — HIV ANTIBODY (ROUTINE TESTING W REFLEX): HIV Screen 4th Generation wRfx: NONREACTIVE

## 2018-08-14 LAB — SARS CORONAVIRUS 2 BY RT PCR (HOSPITAL ORDER, PERFORMED IN ~~LOC~~ HOSPITAL LAB): SARS Coronavirus 2: NEGATIVE

## 2018-08-14 SURGERY — FIXATION, FRACTURE, INTERTROCHANTERIC, WITH INTRAMEDULLARY ROD
Anesthesia: General | Site: Shoulder | Laterality: Left

## 2018-08-14 MED ORDER — PHENYLEPHRINE 40 MCG/ML (10ML) SYRINGE FOR IV PUSH (FOR BLOOD PRESSURE SUPPORT)
PREFILLED_SYRINGE | INTRAVENOUS | Status: AC
  Filled 2018-08-14: qty 30

## 2018-08-14 MED ORDER — LIDOCAINE 2% (20 MG/ML) 5 ML SYRINGE
INTRAMUSCULAR | Status: AC
  Filled 2018-08-14: qty 5

## 2018-08-14 MED ORDER — ENSURE ENLIVE PO LIQD
237.0000 mL | Freq: Every day | ORAL | Status: DC
  Administered 2018-08-15 – 2018-08-16 (×2): 237 mL via ORAL

## 2018-08-14 MED ORDER — CLONIDINE HCL (ANALGESIA) 100 MCG/ML EP SOLN
EPIDURAL | Status: DC | PRN
  Administered 2018-08-14: 70 ug

## 2018-08-14 MED ORDER — HYDROMORPHONE HCL 1 MG/ML IJ SOLN
1.0000 mg | INTRAMUSCULAR | Status: DC | PRN
  Administered 2018-08-14 – 2018-08-16 (×6): 1 mg via INTRAVENOUS
  Filled 2018-08-14 (×6): qty 1

## 2018-08-14 MED ORDER — BACITRACIN ZINC 500 UNIT/GM EX OINT
TOPICAL_OINTMENT | CUTANEOUS | Status: AC
  Filled 2018-08-14: qty 28.35

## 2018-08-14 MED ORDER — SUCCINYLCHOLINE CHLORIDE 20 MG/ML IJ SOLN
INTRAMUSCULAR | Status: DC | PRN
  Administered 2018-08-14: 100 mg via INTRAVENOUS

## 2018-08-14 MED ORDER — ACETAMINOPHEN 325 MG PO TABS
650.0000 mg | ORAL_TABLET | Freq: Four times a day (QID) | ORAL | Status: DC
  Administered 2018-08-15: 650 mg via ORAL
  Filled 2018-08-14 (×4): qty 2

## 2018-08-14 MED ORDER — CEFAZOLIN SODIUM 1 G IJ SOLR
INTRAMUSCULAR | Status: AC
  Filled 2018-08-14: qty 20

## 2018-08-14 MED ORDER — DEXAMETHASONE SODIUM PHOSPHATE 10 MG/ML IJ SOLN
INTRAMUSCULAR | Status: DC | PRN
  Administered 2018-08-14: 10 mg via INTRAVENOUS

## 2018-08-14 MED ORDER — ACETAMINOPHEN 325 MG PO TABS: 650.0000 mg | ORAL_TABLET | Freq: Four times a day (QID) | ORAL | Status: DC | PRN

## 2018-08-14 MED ORDER — ONDANSETRON HCL 4 MG/2ML IJ SOLN
INTRAMUSCULAR | Status: DC | PRN
  Administered 2018-08-14: 4 mg via INTRAVENOUS

## 2018-08-14 MED ORDER — GABAPENTIN 100 MG PO CAPS
100.0000 mg | ORAL_CAPSULE | Freq: Three times a day (TID) | ORAL | Status: DC
  Administered 2018-08-15 (×2): 100 mg via ORAL
  Filled 2018-08-14 (×3): qty 1

## 2018-08-14 MED ORDER — PROPOFOL 10 MG/ML IV BOLUS
INTRAVENOUS | Status: AC
  Filled 2018-08-14: qty 20

## 2018-08-14 MED ORDER — MORPHINE SULFATE (PF) 2 MG/ML IV SOLN: 0.5000 mg | INTRAVENOUS | Status: DC | PRN

## 2018-08-14 MED ORDER — CEFAZOLIN SODIUM-DEXTROSE 2-3 GM-%(50ML) IV SOLR
INTRAVENOUS | Status: DC | PRN
  Administered 2018-08-14: 2 g via INTRAVENOUS

## 2018-08-14 MED ORDER — VITAMIN D 25 MCG (1000 UNIT) PO TABS
4000.0000 [IU] | ORAL_TABLET | Freq: Every day | ORAL | Status: DC
  Administered 2018-08-15 – 2018-08-17 (×3): 4000 [IU] via ORAL
  Filled 2018-08-14 (×3): qty 4

## 2018-08-14 MED ORDER — FENTANYL CITRATE (PF) 100 MCG/2ML IJ SOLN
INTRAMUSCULAR | Status: DC | PRN
  Administered 2018-08-14: 100 ug via INTRAVENOUS
  Administered 2018-08-14 (×3): 50 ug via INTRAVENOUS

## 2018-08-14 MED ORDER — 0.9 % SODIUM CHLORIDE (POUR BTL) OPTIME
TOPICAL | Status: DC | PRN
  Administered 2018-08-14: 1000 mL

## 2018-08-14 MED ORDER — LACTATED RINGERS IV SOLN
INTRAVENOUS | Status: DC
  Administered 2018-08-14 (×2): via INTRAVENOUS

## 2018-08-14 MED ORDER — LIDOCAINE HCL (CARDIAC) PF 100 MG/5ML IV SOSY
PREFILLED_SYRINGE | INTRAVENOUS | Status: DC | PRN
  Administered 2018-08-14: 60 mg via INTRAVENOUS

## 2018-08-14 MED ORDER — OXYCODONE HCL 5 MG PO TABS
5.0000 mg | ORAL_TABLET | ORAL | Status: DC | PRN
  Filled 2018-08-14: qty 2

## 2018-08-14 MED ORDER — PHENYLEPHRINE HCL (PRESSORS) 10 MG/ML IV SOLN
INTRAVENOUS | Status: DC | PRN
  Administered 2018-08-14 (×8): 80 ug via INTRAVENOUS
  Administered 2018-08-14: 120 ug via INTRAVENOUS

## 2018-08-14 MED ORDER — ROCURONIUM BROMIDE 100 MG/10ML IV SOLN
INTRAVENOUS | Status: DC | PRN
  Administered 2018-08-14: 70 mg via INTRAVENOUS

## 2018-08-14 MED ORDER — ROCURONIUM BROMIDE 10 MG/ML (PF) SYRINGE
PREFILLED_SYRINGE | INTRAVENOUS | Status: AC
  Filled 2018-08-14: qty 10

## 2018-08-14 MED ORDER — ADULT MULTIVITAMIN W/MINERALS CH
1.0000 | ORAL_TABLET | Freq: Every day | ORAL | Status: DC
  Administered 2018-08-15 – 2018-08-17 (×3): 1 via ORAL
  Filled 2018-08-14 (×3): qty 1

## 2018-08-14 MED ORDER — ROSUVASTATIN CALCIUM 5 MG PO TABS
10.0000 mg | ORAL_TABLET | Freq: Every day | ORAL | Status: DC
  Administered 2018-08-15 – 2018-08-16 (×2): 10 mg via ORAL
  Filled 2018-08-14 (×2): qty 2

## 2018-08-14 MED ORDER — SUGAMMADEX SODIUM 200 MG/2ML IV SOLN
INTRAVENOUS | Status: DC | PRN
  Administered 2018-08-14: 150 mg via INTRAVENOUS

## 2018-08-14 MED ORDER — ONDANSETRON HCL 4 MG/2ML IJ SOLN
INTRAMUSCULAR | Status: AC
  Filled 2018-08-14: qty 2

## 2018-08-14 MED ORDER — DEXMEDETOMIDINE HCL IN NACL 200 MCG/50ML IV SOLN
INTRAVENOUS | Status: AC
  Filled 2018-08-14: qty 50

## 2018-08-14 MED ORDER — FENTANYL CITRATE (PF) 250 MCG/5ML IJ SOLN
INTRAMUSCULAR | Status: AC
  Filled 2018-08-14: qty 5

## 2018-08-14 MED ORDER — PROPOFOL 10 MG/ML IV BOLUS
INTRAVENOUS | Status: DC | PRN
  Administered 2018-08-14: 60 mg via INTRAVENOUS

## 2018-08-14 MED ORDER — MIDAZOLAM HCL 5 MG/5ML IJ SOLN
INTRAMUSCULAR | Status: DC | PRN
  Administered 2018-08-14: 2 mg via INTRAVENOUS

## 2018-08-14 MED ORDER — HYDROMORPHONE HCL 1 MG/ML IJ SOLN
0.5000 mg | INTRAMUSCULAR | Status: DC | PRN
  Administered 2018-08-14 (×2): 1 mg via INTRAVENOUS
  Filled 2018-08-14 (×2): qty 1

## 2018-08-14 MED ORDER — KETOROLAC TROMETHAMINE 30 MG/ML IJ SOLN: 30.0000 mg | Freq: Once | INTRAMUSCULAR | Status: DC | PRN

## 2018-08-14 MED ORDER — CEFAZOLIN SODIUM-DEXTROSE 2-4 GM/100ML-% IV SOLN
2.0000 g | Freq: Three times a day (TID) | INTRAVENOUS | Status: AC
  Administered 2018-08-14 – 2018-08-15 (×3): 2 g via INTRAVENOUS
  Filled 2018-08-14 (×3): qty 100

## 2018-08-14 MED ORDER — VANCOMYCIN HCL 1000 MG IV SOLR
INTRAVENOUS | Status: AC
  Filled 2018-08-14: qty 1000

## 2018-08-14 MED ORDER — METHOCARBAMOL 500 MG PO TABS
500.0000 mg | ORAL_TABLET | Freq: Four times a day (QID) | ORAL | Status: DC | PRN
  Administered 2018-08-15: 500 mg via ORAL
  Filled 2018-08-14: qty 1

## 2018-08-14 MED ORDER — HYDROMORPHONE HCL 1 MG/ML IJ SOLN
1.0000 mg | Freq: Once | INTRAMUSCULAR | Status: AC
  Administered 2018-08-14: 1 mg via INTRAVENOUS
  Filled 2018-08-14: qty 1

## 2018-08-14 MED ORDER — VANCOMYCIN HCL 1000 MG IV SOLR
INTRAVENOUS | Status: DC | PRN
  Administered 2018-08-14: 1000 mg

## 2018-08-14 MED ORDER — MIDAZOLAM HCL 2 MG/2ML IJ SOLN
INTRAMUSCULAR | Status: AC
  Filled 2018-08-14: qty 2

## 2018-08-14 MED ORDER — PANTOPRAZOLE SODIUM 40 MG PO TBEC
40.0000 mg | DELAYED_RELEASE_TABLET | Freq: Every day | ORAL | Status: DC
  Administered 2018-08-15 – 2018-08-17 (×3): 40 mg via ORAL
  Filled 2018-08-14 (×3): qty 1

## 2018-08-14 MED ORDER — ONDANSETRON HCL 4 MG/2ML IJ SOLN
4.0000 mg | Freq: Four times a day (QID) | INTRAMUSCULAR | Status: DC | PRN
  Filled 2018-08-14 (×2): qty 2

## 2018-08-14 MED ORDER — ALBUTEROL SULFATE (2.5 MG/3ML) 0.083% IN NEBU
INHALATION_SOLUTION | RESPIRATORY_TRACT | Status: AC
  Filled 2018-08-14: qty 3

## 2018-08-14 MED ORDER — HYDROCODONE-ACETAMINOPHEN 5-325 MG PO TABS: 1.0000 | ORAL_TABLET | Freq: Four times a day (QID) | ORAL | Status: DC | PRN

## 2018-08-14 MED ORDER — DEXAMETHASONE SODIUM PHOSPHATE 10 MG/ML IJ SOLN
INTRAMUSCULAR | Status: AC
  Filled 2018-08-14: qty 1

## 2018-08-14 MED ORDER — TOBRAMYCIN SULFATE 1.2 G IJ SOLR
INTRAMUSCULAR | Status: AC
  Filled 2018-08-14: qty 1.2

## 2018-08-14 MED ORDER — SODIUM CHLORIDE 0.9 % IV SOLN
INTRAVENOUS | Status: DC | PRN
  Administered 2018-08-14: 18:00:00 40 ug/min via INTRAVENOUS

## 2018-08-14 MED ORDER — PROMETHAZINE HCL 25 MG/ML IJ SOLN: 6.2500 mg | INTRAMUSCULAR | Status: DC | PRN

## 2018-08-14 MED ORDER — HYDROMORPHONE HCL 1 MG/ML IJ SOLN: 0.2500 mg | INTRAMUSCULAR | Status: DC | PRN

## 2018-08-14 MED ORDER — SUCCINYLCHOLINE CHLORIDE 200 MG/10ML IV SOSY
PREFILLED_SYRINGE | INTRAVENOUS | Status: AC
  Filled 2018-08-14: qty 10

## 2018-08-14 MED ORDER — ALBUTEROL SULFATE (2.5 MG/3ML) 0.083% IN NEBU: 2.5000 mg | INHALATION_SOLUTION | Freq: Once | RESPIRATORY_TRACT | Status: DC

## 2018-08-14 MED ORDER — LIDOCAINE-EPINEPHRINE (PF) 1.5 %-1:200000 IJ SOLN
INTRAMUSCULAR | Status: DC | PRN
  Administered 2018-08-14: 15 mL via PERINEURAL

## 2018-08-14 MED ORDER — ROPIVACAINE HCL 5 MG/ML IJ SOLN
INTRAMUSCULAR | Status: DC | PRN
  Administered 2018-08-14: 25 mL via PERINEURAL

## 2018-08-14 MED ORDER — ENOXAPARIN SODIUM 40 MG/0.4ML ~~LOC~~ SOLN
40.0000 mg | SUBCUTANEOUS | Status: DC
  Administered 2018-08-15 – 2018-08-17 (×3): 40 mg via SUBCUTANEOUS
  Filled 2018-08-14 (×3): qty 0.4

## 2018-08-14 SURGICAL SUPPLY — 68 items
BANDAGE ACE 4X5 VEL STRL LF (GAUZE/BANDAGES/DRESSINGS) ×1 IMPLANT
BIT DRILL 3.2 (BIT) ×1
BIT DRILL 3.2XCALB NS DISP (BIT) IMPLANT
BIT DRILL CALIBRATED 2.7 (BIT) ×1 IMPLANT
BIT DRILL FLUTED FEMUR 4.2/3 (BIT) ×1 IMPLANT
BIT DRL 3.2XCALB NS DISP (BIT) ×3
BLADE HELICAL TFNA 90 (Anchor) ×1 IMPLANT
BNDG ELASTIC 6X10 VLCR STRL LF (GAUZE/BANDAGES/DRESSINGS) ×1 IMPLANT
BRUSH SCRUB SURG 4.25 DISP (MISCELLANEOUS) ×8 IMPLANT
CHLORAPREP W/TINT 26ML (MISCELLANEOUS) ×5 IMPLANT
COVER PERINEAL POST (MISCELLANEOUS) ×3 IMPLANT
COVER SURGICAL LIGHT HANDLE (MISCELLANEOUS) ×6 IMPLANT
COVER WAND RF STERILE (DRAPES) IMPLANT
DERMABOND ADHESIVE PROPEN (GAUZE/BANDAGES/DRESSINGS) ×1
DERMABOND ADVANCED (GAUZE/BANDAGES/DRESSINGS) ×1
DERMABOND ADVANCED .7 DNX12 (GAUZE/BANDAGES/DRESSINGS) ×3 IMPLANT
DERMABOND ADVANCED .7 DNX6 (GAUZE/BANDAGES/DRESSINGS) IMPLANT
DRAPE C-ARM 35X43 STRL (DRAPES) ×5 IMPLANT
DRAPE C-ARMOR (DRAPES) ×2 IMPLANT
DRAPE IMP U-DRAPE 54X76 (DRAPES) ×8 IMPLANT
DRAPE INCISE IOBAN 66X45 STRL (DRAPES) ×4 IMPLANT
DRAPE ORTHO SPLIT 77X108 STRL (DRAPES) ×2
DRAPE STERI IOBAN 125X83 (DRAPES) ×4 IMPLANT
DRAPE SURG 17X23 STRL (DRAPES) ×8 IMPLANT
DRAPE SURG ORHT 6 SPLT 77X108 (DRAPES) IMPLANT
DRAPE U-SHAPE 47X51 STRL (DRAPES) ×4 IMPLANT
DRAPE U-SHAPE 76X120 STRL (DRAPES) ×2 IMPLANT
DRSG MEPILEX BORDER 4X4 (GAUZE/BANDAGES/DRESSINGS) ×5 IMPLANT
DRSG MEPILEX BORDER 4X8 (GAUZE/BANDAGES/DRESSINGS) ×4 IMPLANT
ELECT REM PT RETURN 9FT ADLT (ELECTROSURGICAL) ×4
ELECTRODE REM PT RTRN 9FT ADLT (ELECTROSURGICAL) ×3 IMPLANT
GLOVE BIO SURGEON STRL SZ 6.5 (GLOVE) ×18 IMPLANT
GLOVE BIO SURGEON STRL SZ7.5 (GLOVE) ×22 IMPLANT
GLOVE BIOGEL PI IND STRL 6.5 (GLOVE) ×3 IMPLANT
GLOVE BIOGEL PI IND STRL 7.5 (GLOVE) ×3 IMPLANT
GLOVE BIOGEL PI INDICATOR 6.5 (GLOVE) ×2
GLOVE BIOGEL PI INDICATOR 7.5 (GLOVE) ×2
GOWN STRL REUS W/ TWL LRG LVL3 (GOWN DISPOSABLE) ×3 IMPLANT
GOWN STRL REUS W/TWL LRG LVL3 (GOWN DISPOSABLE) ×1
GUIDEWIRE 3.2X400 (WIRE) ×2 IMPLANT
K-WIRE 2X5 SS THRDED S3 (WIRE) ×8
KIT BASIN OR (CUSTOM PROCEDURE TRAY) ×4 IMPLANT
KIT TURNOVER KIT B (KITS) ×4 IMPLANT
KWIRE 2X5 SS THRDED S3 (WIRE) IMPLANT
MANIFOLD NEPTUNE II (INSTRUMENTS) ×4 IMPLANT
NAIL TROCH FIX 10X170 130 (Nail) ×1 IMPLANT
NS IRRIG 1000ML POUR BTL (IV SOLUTION) ×4 IMPLANT
PACK GENERAL/GYN (CUSTOM PROCEDURE TRAY) ×4 IMPLANT
PAD ARMBOARD 7.5X6 YLW CONV (MISCELLANEOUS) ×8 IMPLANT
PAD CAST 4YDX4 CTTN HI CHSV (CAST SUPPLIES) IMPLANT
PADDING CAST COTTON 4X4 STRL (CAST SUPPLIES) ×2
PADDING CAST COTTON 6X4 STRL (CAST SUPPLIES) ×2 IMPLANT
PEG LOCKING 3.2X32 (Peg) ×1 IMPLANT
PEG LOCKING 3.2X36 (Screw) ×2 IMPLANT
PEG LOCKING 3.2X40 (Peg) ×2 IMPLANT
PLATE 3HOLE HUMERUS PROX RT (Plate) ×1 IMPLANT
SCREW LOCK CORT STAR 3.5X52 (Screw) ×2 IMPLANT
SCREW LOCK CORT STAR 3.5X54 (Screw) ×1 IMPLANT
SCREW LOCK STAR 5X34 (Screw) ×1 IMPLANT
SCREW LP NL T15 3.5X24 (Screw) ×3 IMPLANT
SLEEVE MEASURING 3.2 (BIT) ×1 IMPLANT
SUT MNCRL AB 3-0 PS2 18 (SUTURE) ×4 IMPLANT
SUT VIC AB 0 CT1 27 (SUTURE)
SUT VIC AB 0 CT1 27XBRD ANBCTR (SUTURE) IMPLANT
SUT VIC AB 2-0 CT1 27 (SUTURE) ×2
SUT VIC AB 2-0 CT1 TAPERPNT 27 (SUTURE) ×6 IMPLANT
TOWEL OR 17X26 10 PK STRL BLUE (TOWEL DISPOSABLE) ×8 IMPLANT
WATER STERILE IRR 1000ML POUR (IV SOLUTION) ×4 IMPLANT

## 2018-08-14 NOTE — ED Notes (Signed)
Ortho notified for splints.

## 2018-08-14 NOTE — ED Notes (Addendum)
ED TO INPATIENT HANDOFF REPORT  ED Nurse Name and Phone #:  [phone] Leonidas Rombergina  S Name/Age/Gender Cynthia Hobbs 63 y.o. female Room/Bed: 015C/015C  Code Status   Code Status: Full Code  Home/SNF/Other Home Patient oriented to: self, place, time and situation Is this baseline? Yes   Triage Complete: Triage complete  Chief Complaint fall injury to leftside  Triage Note Pt via PTAR with c/o fall, she reports standing on at table dusting when the table tipped. No LOC, fell onto her left side. Pain on the left ankle, left hip, left humerus and lower back. No shortening or rotation of the hip but she is guarding the left arm. She is on blood thinner for PVD. A/O at time of triage.    Allergies No Known Allergies  Level of Care/Admitting Diagnosis ED Disposition    ED Disposition Condition Comment   Admit  [facility] [[id number]]  Level of Care: Med-Surg [16]  Covid Evaluation: Screening Protocol (No Symptoms)  Diagnosis: Closed left hip fracture, initial encounter [facility](HCC) [[id number]]) [[id number]]  Admitting Physician: Wyvonnia DuskyGARDNER, JARED M [[id number]]  Attending Physician: Hillary BowGARDNER, JARED M [phone][[id number]]  Estimated length of stay: past midnight tomorrow  Certification:: I certify this patient will need inpatient services for at least 2 midnights  PT Class (Do Not Modify): Inpatient [101]  PT Acc Code (Do Not Modify): Private [1]       B Medical/Surgery History Past Medical History:  Diagnosis Date  . GERD (gastroesophageal reflux disease)   . PAD (peripheral artery disease) (HCC)    Past Surgical History:  Procedure Laterality Date  . ABDOMINAL AORTOGRAM W/LOWER EXTREMITY Bilateral 04/29/2018   Procedure: ABDOMINAL AORTOGRAM W/LOWER EXTREMITY;  Surgeon: Runell GessBerry, Jonathan J, MD;  [facility];  Service: Cardiovascular;  Laterality: Bilateral;  . BACK SURGERY    . BREAST EXCISIONAL BIOPSY Right    benign  . PERIPHERAL VASCULAR ATHERECTOMY Right 04/29/2018   Procedure: PERIPHERAL VASCULAR ATHERECTOMY;  Surgeon: Runell GessBerry, Jonathan J, MD;  [facility];  Service: Cardiovascular;  Laterality: Right;  SFA     A IV Location/Drains/Wounds Patient Lines/Drains/Airways Status   Active Line/Drains/Airways    Name:   Placement date:   Placement time:   Site:   Days:   Peripheral IV 08/13/18 Right Forearm   08/13/18    2334    Forearm   1   External Urinary Catheter   08/14/18    0054    -   less than 1          Intake/Output Last 24 hours No intake or output data in the 24 hours ending 08/14/18 0100  Labs/Imaging Results for orders placed or performed during the hospital encounter of 08/13/18 (from the past 48 hour(s))  CBC     Status: Abnormal   Collection Time: 08/13/18 10:33 PM  Result Value Ref Range   WBC 11.9 (H) 4.0 - 10.5 K/uL   RBC 4.68 3.87 - 5.11 MIL/uL   Hemoglobin 13.3 12.0 - 15.0 g/dL   HCT 27.241.5 53.636.0 - 64.446.0 %   MCV 88.7 80.0 - 100.0 fL   MCH 28.4 26.0 - 34.0 pg   MCHC 32.0 30.0 - 36.0 g/dL   RDW 03.412.1 74.211.5 - 59.515.5 %   Platelets 257 150 - 400 K/uL   nRBC 0.0 0.0 - 0.2 %    Comment: Performed at [facility], [address] [place], [place] [id number]  Basic metabolic panel  Status: Abnormal   Collection Time: 08/13/18 10:33 PM  Result Value Ref Range   Sodium 140 135 - 145 mmol/L   Potassium 3.8 3.5 - 5.1 mmol/L   Chloride 109 98 - 111 mmol/L   CO2 23 22 - 32 mmol/L   Glucose, Bld 121 (H) 70 - 99 mg/dL   BUN 21 8 - 23 mg/dL   Creatinine, Ser 5.40 0.44 - 1.00 mg/dL   Calcium 8.7 (L) 8.9 - 10.3 mg/dL   GFR calc non Af Amer >60 >60 mL/min   GFR calc Af Amer >60 >60 mL/min   Anion gap 8 5 - 15    Comment: Performed at [facility], [address] [place], [place] [address]   Dg Ankle Complete Left  Result Date: 08/13/2018 CLINICAL DATA:  Recent fall with left ankle pain, initial encounter EXAM: LEFT ANKLE COMPLETE - 3+ VIEW COMPARISON:  None. FINDINGS: Soft tissue swelling is noted primarily  medially. Some increased density is noted in the midportion of the calcaneus suspicious for an impacted fracture. Calcaneal spurring is noted. No other definitive fracture is seen. IMPRESSION: Changes suspicious for calcaneal fracture. CT may be helpful for further evaluation as clinically indicated. Electronically Signed   By: Alcide Clever M.D.   On: 08/13/2018 23:16   Ct Foot Left Wo Contrast  Result Date: 08/14/2018 CLINICAL DATA:  Changes suggestive of calcaneal fracture on recent plain film EXAM: CT OF THE LEFT FOOT WITHOUT CONTRAST TECHNIQUE: Multidetector CT imaging of the left foot was performed according to the standard protocol. Multiplanar CT image reconstructions were also generated. COMPARISON:  Plain film examination obtained earlier in the same day. FINDINGS: Bones/Joint/Cartilage Comminuted fracture of the calcaneus is noted with impaction at the fracture site. This accounts for the increased density within the calcaneus seen on the recent exam. The talus appears within normal limits. Distal tibia and distal fibula are unremarkable. The remainder of the bony structures of the foot are within normal limits. Ligaments Suboptimally assessed by CT. Muscles and Tendons Surrounding musculature is within normal limits. Soft tissues Mild soft tissue swelling is noted related to the recent injury. IMPRESSION: Comminuted fracture of the left calcaneus with extension into the articular surface with the talus as well as significant impaction at the fracture site. Electronically Signed   By: Alcide Clever M.D.   On: 08/14/2018 00:30   Dg Chest Port 1 View  Result Date: 08/14/2018 CLINICAL DATA:  Left shoulder and hip pain following fall, initial encounter EXAM: PORTABLE CHEST 1 VIEW COMPARISON:  02/06/2018 FINDINGS: Cardiac shadows within normal limits. Lungs are well aerated bilaterally. Comminuted left proximal humeral fracture is again noted. The underlying bony thorax is within normal limits.  IMPRESSION: No active disease. Electronically Signed   By: Alcide Clever M.D.   On: 08/14/2018 00:04   Dg Shoulder Left  Result Date: 08/13/2018 CLINICAL DATA:  Recent fall with left shoulder pain EXAM: LEFT SHOULDER - 2+ VIEW COMPARISON:  None. FINDINGS: Comminuted fracture of the proximal left humerus is seen with impaction at the fracture site. Fracture site is primarily at the surgical neck. The underlying bony thorax is within normal limits. Degenerative changes of the acromioclavicular joint are seen. IMPRESSION: Comminuted proximal left humeral fracture. Electronically Signed   By: Alcide Clever M.D.   On: 08/13/2018 23:17   Dg Humerus Left  Result Date: 08/13/2018 CLINICAL DATA:  Recent fall with left shoulder pain, initial encounter EXAM: LEFT HUMERUS - 2+ VIEW COMPARISON:  None.  FINDINGS: Comminuted proximal left humeral fracture is noted with impaction at the fracture site. The fracture predominantly involves the surgical neck. The underlying bony thorax is within normal limits. Degenerative changes of the acromioclavicular joint are noted. IMPRESSION: Comminuted proximal left humeral fracture. Electronically Signed   By: Alcide Clever M.D.   On: 08/13/2018 23:12   Dg Hip Unilat W Or Wo Pelvis 2-3 Views Left  Result Date: 08/13/2018 CLINICAL DATA:  Recent fall with pelvic pain, initial encounter EXAM: DG HIP (WITH OR WITHOUT PELVIS) 2-3V LEFT COMPARISON:  None. FINDINGS: Pelvic ring is intact. Undisplaced intratrochanteric left femoral fracture is seen. No soft tissue abnormality is noted. IMPRESSION: Undisplaced left intratrochanteric femoral fracture. Electronically Signed   By: Alcide Clever M.D.   On: 08/13/2018 23:15    Pending Labs Unresulted Labs (From admission, onward)    Start     Ordered   08/14/18 0057  Type and screen [facility]  Once,   R    Comments:  [facility]    08/14/18 0056   08/14/18 0051  HIV antibody (Routine Testing)  Once,   R      08/14/18 0056   08/13/18 2344  SARS Coronavirus 2 (CEPHEID - Performed in [facility] hospital lab), Hosp Order  (Asymptomatic Patients Labs)  Once,   R    Question:  Rule Out  Answer:  Yes   08/13/18 2343          Vitals/Pain Today's Vitals   08/13/18 2320 08/14/18 0027 08/14/18 0027 08/14/18 0032  BP: (!) 131/94  124/89   Pulse: 96  (!) 101   Resp:   18   Temp:      TempSrc:      SpO2: 96%  91%   Weight:    68 kg  Height:    5\' 2"  (1.575 m)  PainSc:  9       Isolation Precautions No active isolations  Medications Medications  HYDROcodone-acetaminophen (NORCO/VICODIN) 5-325 MG per tablet 1-2 tablet (has no administration in time range)  morphine 2 MG/ML injection 0.5 mg (has no administration in time range)  fentaNYL (SUBLIMAZE) injection 50 mcg (50 mcg Intravenous Given 08/13/18 2325)  ondansetron (ZOFRAN) injection 4 mg (4 mg Intravenous Given 08/13/18 2324)  HYDROmorphone (DILAUDID) injection 1 mg (1 mg Intravenous Given 08/14/18 0028)    Mobility walks Low fall risk  Focused Assessments Left ankle, left trochanter, and left humerus break.   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

## 2018-08-14 NOTE — Progress Notes (Signed)
Initial Nutrition Assessment  RD working remotely.  DOCUMENTATION CODES:   Not applicable  INTERVENTION:   -Once diet is advanced, add:   -Ensure Enlive po daily, each supplement provides 350 kcal and 20 grams of protein -MVI with minerals daily  NUTRITION DIAGNOSIS:   Increased nutrient needs related to post-op healing as evidenced by estimated needs.  GOAL:   Patient will meet greater than or equal to 90% of their needs  MONITOR:   PO intake, Supplement acceptance, Diet advancement, Labs, Weight trends, Skin, I & O's  REASON FOR ASSESSMENT:   Consult Hip fracture protocol  ASSESSMENT:   Cynthia Hobbs is a 63 y.o. female with medical history significant of PAD, smoking.  Patient presents to ED following a fall off of a kitchen table while dusting.  Landed on L side.  Severe pain in shoulder, hip, and heel following fall, unable to ambulate.  Pt admitted with lt humerus, lt calcaneus, and lt hip fractures.   Pt NPO for intramedullary nailing of lt femur. Orthopedics plans to evaluate humerus fracture under fluoroscopy. Also plan for non-operative management of calcaneous fracture.   Pt in OR. RD unable to obtain further ntriton-related history at this time.   Reviewed wt hx; pt wt has been stable over the past 5 months.   Pt with increased nutritional needs due to post-op healing and would benefit from addition of nutritional supplements.   Labs reviewed.   NUTRITION - FOCUSED PHYSICAL EXAM:    Most Recent Value  Orbital Region  Unable to assess  Upper Arm Region  Unable to assess  Thoracic and Lumbar Region  Unable to assess  Buccal Region  Unable to assess  Temple Region  Unable to assess  Clavicle Bone Region  Unable to assess  Clavicle and Acromion Bone Region  Unable to assess  Scapular Bone Region  Unable to assess  Dorsal Hand  Unable to assess  Patellar Region  Unable to assess  Anterior Thigh Region  Unable to assess  Posterior Calf Region  Unable  to assess  Edema (RD Assessment)  Unable to assess  Hair  Unable to assess  Eyes  Unable to assess  Mouth  Unable to assess  Skin  Unable to assess  Nails  Unable to assess       Diet Order:   Diet Order            Diet NPO time specified  Diet effective now              EDUCATION NEEDS:   No education needs have been identified at this time  Skin:  Skin Assessment: Reviewed RN Assessment  Last BM:  08/13/18  Height:   Ht Readings from Last 1 Encounters:  08/14/18 5\' 2"  (1.575 m)    Weight:   Wt Readings from Last 1 Encounters:  08/14/18 68 kg    Ideal Body Weight:  50 kg  BMI:  Body mass index is 27.44 kg/m.  Estimated Nutritional Needs:   Kcal:  1700-1900  Protein:  85-100 grams  Fluid:  1.7-1.9 L    Guiselle Mian A. Mayford Knife, RD, LDN, CDCES Registered Dietitian II Certified Diabetes Care and Education Specialist Pager: [phone] After hours Pager: [phone]

## 2018-08-14 NOTE — ED Notes (Signed)
Attempted to call report; nurse in the middle of a procedure and will call me back.

## 2018-08-14 NOTE — Progress Notes (Signed)
Orthopedic Tech Progress Note Patient Details:  Cynthia Hobbs 1955-05-18 [id number]  Ortho Devices Type of Ortho Device: Quincy Simmonds splint, Shoulder immobilizer Ortho Device/Splint Interventions: Application, Ordered, Adjustment   Post Interventions Patient Tolerated: Well Instructions Provided: Care of device, Adjustment of device   Norva Karvonen T 08/14/2018, 12:52 AM

## 2018-08-14 NOTE — Transfer of Care (Signed)
Immediate Anesthesia Transfer of Care Note  Patient: Cynthia Hobbs  Procedure(s) Performed: INTRAMEDULLARY (IM) NAIL INTERTROCHANTRIC (Left Leg Upper) Open Reduction Internal Fixation (Orif) Proximal Humerus Fracture (Left Shoulder) APPLICATION OF SPLINT LEFT LOWER LEG (Left Leg Lower)  Patient Location: PACU  Anesthesia Type:GA combined with regional for post-op pain  Level of Consciousness: awake, oriented, drowsy and patient cooperative  Airway & Oxygen Therapy: Patient Spontanous Breathing and non-rebreather face mask  Post-op Assessment: Report given to RN and Post -op Vital signs reviewed and unstable, Anesthesiologist notified  Post vital signs: Reviewed  Last Vitals:  Vitals Value Taken Time  BP 127/114 08/14/2018  7:35 PM  Temp    Pulse 119 08/14/2018  7:37 PM  Resp 25 08/14/2018  7:37 PM  SpO2 100 % 08/14/2018  7:37 PM  Vitals shown include unvalidated device data.  Last Pain:  Vitals:   08/14/18 1230  TempSrc: Oral  PainSc:       Patients Stated Pain Goal: 3 (08/14/18 0255)  Complications: No apparent anesthesia complications   Upon arrival to PACU, C/O of sob.  Saturation lower 60's.  Switched to non-rebreather 100% fi02.  HOB Elevated.  Suctioned.  Pulmonary toilet (cough/deep breathing).  Saturation quickly normalized in the 90's.  Lungs sounds equal bilateral. Distant. Diffuse wheezing. Albuterol treatment given with some improvement.   Dr. Mal Amabile MDA notified.  Will order chest-x-ray to rule out pneumothorax.  Grip strength strong.  Able to lift head off bed and maintain >5sec.  No evidence of residual neuromuscular blockade.

## 2018-08-14 NOTE — Social Work (Signed)
CSW acknowledging consult for SNF placement. Will follow for therapy recommendations.   Icholas Irby, MSW, LCSWA [place] Clinical Social Work [phone]   

## 2018-08-14 NOTE — Plan of Care (Signed)
  Problem: Education: Goal: Knowledge of General Education information will improve Description Including pain rating scale, medication(s)/side effects and non-pharmacologic comfort measures Outcome: Progressing   Problem: Activity: Goal: Risk for activity intolerance will decrease Outcome: Not Progressing   Problem: Coping: Goal: Level of anxiety will decrease Outcome: Progressing   Problem: Pain Managment: Goal: General experience of comfort will improve Outcome: Progressing   Problem: Safety: Goal: Ability to remain free from injury will improve Outcome: Progressing

## 2018-08-14 NOTE — Progress Notes (Signed)
Patient seen and examined, admitted earlier this morning by Dr. Julian Reil . this is a 63 year old female with history of longstanding tobacco abuse, PAD and right lower extremity status post angioplasty in 2/202 0 on Plavix was admitted earlier this morning following a mechanical fall off a kitchen table while dusting her chandelier. -In the emergency room she was noted to have a left hip intertrochanteric fracture, left humerus fracture and left ankle fracture. -Orthopedics consulting, plan for ORIF of left hip and left proximal humerus today -Plan for nonoperative management of left calcaneus since this is non-displaced with nonweightbearing status for 6 to 8 weeks -Resume Plavix post surgery tomorrow -Labs in a.m.  Zannie Cove, MD

## 2018-08-14 NOTE — H&P (View-Only) (Signed)
Orthopaedic Trauma Service (OTS) Consult   Patient ID: Cynthia Hobbs MRN: [id number] DOB/AGE: 06/21/1955 63 y.o.  Reason for Consult: Left proximal humerus fracture, left calcaneus fracture, left intertrochanteric femur fracture Referring Physician: Dr. Landau  HPI: Cynthia Hobbs is an 63 y.o. female being seen in consultation at request of Dr. Landau for a left proximal humerus fracture, left calcaneus fracture, and left intertrochanteric femur fracture. Patient presents to ED following a fall off of a kitchen table while dusting last night.  Landed on her left side.  Had severe pain in shoulder, hip, and heel following fall, was unable to ambulate. Was found to have the above fracture on imaging in the ED. Orthopedics was consulted. Due to complexity of injuries and OR availability, Dr. Landau felt these fractures would best be managed by an orthopaedic traumatologist. Was placed in a short leg splint and sling in the ED and  patient was admitted to the hospitalist service overnight for planned surgical intervention today.  Patient evaluated this morning on 6N, pain has been decently well controlled overnight.. If she remains flat pain is manageable, worsens with movement. She denies any numbness or tingling in any of her extremities.   Patient is left hand dominant. 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 Lives in a mobile home with her mother in [place]. Has about 3 steps to get inside the home. Patient takes Plavix daily, last dose was on Monday evening. She is a current everyday smoker. Denies alcohol use.  Past Medical History:  Diagnosis Date  . GERD (gastroesophageal reflux disease)   . PAD (peripheral artery disease) (HCC)     Past Surgical History:  Procedure Laterality Date  . ABDOMINAL AORTOGRAM W/LOWER EXTREMITY Bilateral 04/29/2018   Procedure: ABDOMINAL AORTOGRAM  W/LOWER EXTREMITY;  Surgeon: Berry, Jonathan J, MD;  [facility]: [facility];  Service: Cardiovascular;  Laterality: Bilateral;  . BACK SURGERY    . BREAST EXCISIONAL BIOPSY Right    benign  . PERIPHERAL VASCULAR ATHERECTOMY Right 04/29/2018   Procedure: PERIPHERAL VASCULAR ATHERECTOMY;  Surgeon: Berry, Jonathan J, MD;  Location: [facility];  Service: Cardiovascular;  Laterality: Right;  SFA    Family History  Problem Relation Age of Onset  . Hypertension Mother   . Dementia Mother   . Cancer Father     Social History:  reports that she has been smoking cigarettes. She has never used smokeless tobacco. She reports that she does not drink alcohol or use drugs.  Allergies: No Known Allergies  Medications: I have reviewed the patient's current medications.  ROS: Constitutional: No fever or chills Vision: No changes in vision ENT: No difficulty swallowing CV: No chest pain Pulm: No SOB or wheezing GI: No nausea or vomiting GU: No urgency or inability to hold urine Skin: No poor wound healing Neurologic: No numbness or tingling Psychiatric: No depression or anxiety Heme: No bruising Allergic: No reaction to medications or food   Exam: Blood pressure (!) 137/96, pulse 100, temperature 98 F (36.7 C), temperature source Oral, resp. rate 16, height 5' 2" (1.575 m), weight 68 kg, SpO2 97 %. General: Laying in bed, NAD Orientation: Alert and oriented x 3 Mood and Affect: Mood and affect appropriate Gait: Not assessed due to multiple known fractures Coordination and balance: within normal limits  Left upper extremity: Sling in place. Minimal swelling   noted in the arm of hand. Tenderness to palpation of anterior shoulder and proximal humerus. Less tender in elbow or forearm. Able to flex and extend the elbow some while in the sling. Shoulder range of motion not assessed. Full wrist range of motion. Able to wiggle fingers. Sensation intact distally. +radial pulse  Left  Lower extremity: Short leg splint in place. Non-tender to palpation of hip, thigh, knee. Did not assess knee ROM due to known femur fracture. Sensation intact to light touch of toes, knee and lateral thigh. Able to wiggle toes. Brisk cap refill less than 2 seconds.   Right Upper extremity: Skin without lesions. No tenderness to palpation. Full painless ROM, full strength in each muscle group without evidence of instability. Sensory and motor function intact. +radial pulse  Right Lower extremity: Skin without lesions. No tenderness to palpation. Full painless ROM, full strength in each muscle group without evidence of instability. Sensation and motor function intact. +DP pulse  Medical Decision Making: Imaging: Xray and CT of left foot showed comminuted fracture of the calcaneus with extension into the articular surface with the talus and with impaction at the fracture site  AP and lateral of left shoulder revealed acute comminuted proximal humerus fracture. Degenerative changes of the acromioclavicular joint are seen.  Xray of left hip with view of pelvis showed a non-displaced intertrochanteric femur fracture. Pelvic ring intact.  Labs:  Results for orders placed or performed during the hospital encounter of 08/13/18 (from the past 24 hour(s))  CBC     Status: Abnormal   Collection Time: 08/13/18 10:33 PM  Result Value Ref Range   WBC 11.9 (H) 4.0 - 10.5 K/uL   RBC 4.68 3.87 - 5.11 MIL/uL   Hemoglobin 13.3 12.0 - 15.0 g/dL   HCT 36.6 29.4 - 76.5 %   MCV 88.7 80.0 - 100.0 fL   MCH 28.4 26.0 - 34.0 pg   MCHC 32.0 30.0 - 36.0 g/dL   RDW 46.5 03.5 - 46.5 %   Platelets 257 150 - 400 K/uL   nRBC 0.0 0.0 - 0.2 %  Basic metabolic panel     Status: Abnormal   Collection Time: 08/13/18 10:33 PM  Result Value Ref Range   Sodium 140 135 - 145 mmol/L   Potassium 3.8 3.5 - 5.1 mmol/L   Chloride 109 98 - 111 mmol/L   CO2 23 22 - 32 mmol/L   Glucose, Bld 121 (H) 70 - 99 mg/dL   BUN 21 8 - 23  mg/dL   Creatinine, Ser 6.81 0.44 - 1.00 mg/dL   Calcium 8.7 (L) 8.9 - 10.3 mg/dL   GFR calc non Af Amer >60 >60 mL/min   GFR calc Af Amer >60 >60 mL/min   Anion gap 8 5 - 15  SARS Coronavirus 2 (CEPHEID - Performed in [facility] hospital lab), Hosp Order     Status: None   Collection Time: 08/14/18 12:29 AM  Result Value Ref Range   SARS Coronavirus 2 NEGATIVE NEGATIVE  Type and screen [facility]     Status: None   Collection Time: 08/14/18  1:29 AM  Result Value Ref Range   ABO/RH(D) A POS    Antibody Screen NEG    Sample Expiration      08/17/2018,2359 Performed at [facility], [address], [place], [place] [address]   ABO/Rh     Status: None   Collection Time: 08/14/18  1:29 AM  Result Value Ref Range  ABO/RH(D)      A POS Performed at [facility], [address] [place], [place] [address]      Medical history and chart was reviewed  Assessment/Plan: 63 year old female s/p fall from a height, resulting in several injuries including left proximal humerus fracture, left non-displaced intertrochanteric femur fracture, and left calcaneus fracture.  I would recommend proceeding with intramedullary nailing of left femur. Discussed risks and benefits of surgery with the patient. Risks discussed included bleeding requiring blood transfusion, bleeding causing a hematoma, infection, malunion, nonunion, damage to surrounding nerves and blood vessels, pain, hardware failure, stiffness, post-traumatic arthritis, DVT/PE. She would like to proceed with surgery of this.   We will plan to treat her calcaneus fracture non-operatively so she will be non-weightbearing on the leg for at least 6 weeks.   I also discussed operative vs non-operative treatment of her humerus fracture with the patient. 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 Will plan to evaluate the fracture under fluoro while in the  OR and determine if surgical intervention is the best treatment option. Patient is in agreement to this plan and wants to proceed with whatever will provide her with the best outcome and most pain relief.   Darrah Dredge A. Ladonna SnideYacobi, PA-C [facility] ?[phone]? (phone)

## 2018-08-14 NOTE — Progress Notes (Signed)
Instructed pt on incentive spirometry.  Surgical PCR done.

## 2018-08-14 NOTE — Anesthesia Preprocedure Evaluation (Signed)
Anesthesia Evaluation  Patient identified by MRN, date of birth, ID band Patient awake    Reviewed: Allergy & Precautions, NPO status , Patient's Chart, lab work & pertinent test results  Airway Mallampati: II  TM Distance: >3 FB Neck ROM: Full    Dental no notable dental hx.    Pulmonary Current Smoker,    Pulmonary exam normal breath sounds clear to auscultation       Cardiovascular hypertension, Pt. on medications + Peripheral Vascular Disease  Normal cardiovascular exam Rhythm:Regular Rate:Normal     Neuro/Psych negative neurological ROS  negative psych ROS   GI/Hepatic Neg liver ROS, GERD  ,  Endo/Other  negative endocrine ROS  Renal/GU negative Renal ROS  negative genitourinary   Musculoskeletal negative musculoskeletal ROS (+)   Abdominal   Peds negative pediatric ROS (+)  Hematology negative hematology ROS (+)   Anesthesia Other Findings   Reproductive/Obstetrics negative OB ROS                             Anesthesia Physical Anesthesia Plan  ASA: II  Anesthesia Plan: General   Post-op Pain Management:    Induction: Intravenous  PONV Risk Score and Plan: 2 and Ondansetron, Dexamethasone and Treatment may vary due to age or medical condition  Airway Management Planned: Oral ETT  Additional Equipment:   Intra-op Plan:   Post-operative Plan: Extubation in OR  Informed Consent: I have reviewed the patients History and Physical, chart, labs and discussed the procedure including the risks, benefits and alternatives for the proposed anesthesia with the patient or authorized representative who has indicated his/her understanding and acceptance.     Dental advisory given  Plan Discussed with: CRNA and Surgeon  Anesthesia Plan Comments:         Anesthesia Quick Evaluation

## 2018-08-14 NOTE — H&P (Signed)
History and Physical    Cynthia Hobbs NTZ:[id number] DOB: 07-10-1955 DOA: 08/13/2018  PCP: Merri Brunette, MD  Patient coming from: Home  I have personally briefly reviewed patient's old medical records in [facility]  Chief Complaint: Fall  HPI: Cynthia Hobbs is a 63 y.o. female with medical history significant of PAD, smoking.  Patient presents to ED following a fall off of a kitchen table while dusting.  Landed on L side.  Severe pain in shoulder, hip, and heel following fall, unable to ambulate.   ED Course: L intertrochanteric hip fx, L humerus fx, and non displaced L calcaneal fx.   Review of Systems: As per HPI otherwise 10 point review of systems negative.   Past Medical History:  Diagnosis Date   GERD (gastroesophageal reflux disease)    PAD (peripheral artery disease) (HCC)     Past Surgical History:  Procedure Laterality Date   ABDOMINAL AORTOGRAM W/LOWER EXTREMITY Bilateral 04/29/2018   Procedure: ABDOMINAL AORTOGRAM W/LOWER EXTREMITY;  Surgeon: Runell Gess, MD;  Location: [facility];  Service: Cardiovascular;  Laterality: Bilateral;   BACK SURGERY     BREAST EXCISIONAL BIOPSY Right    benign   PERIPHERAL VASCULAR ATHERECTOMY Right 04/29/2018   Procedure: PERIPHERAL VASCULAR ATHERECTOMY;  Surgeon: Runell Gess, MD;  Location: [facility] INVASIVE CV LAB;  Service: Cardiovascular;  Laterality: Right;  SFA     reports that she has been smoking cigarettes. She has never used smokeless tobacco. She reports that she does not drink alcohol or use drugs.  No Known Allergies  Family History  Problem Relation Age of Onset   Hypertension Mother    Dementia Mother    Cancer Father      Prior to Admission medications   Medication Sig Start Date End Date Taking? Authorizing Provider  amLODipine (NORVASC) 5 MG tablet Take 5 mg by mouth daily.    [provider]  aspirin EC 81 MG EC tablet Take 1 tablet (81 mg total) by mouth daily. 04/30/18    Georgie Chard D, NP  Cholecalciferol (VITAMIN D3) 50 MCG (2000 UT) TABS Take 2,000 Units by mouth daily.    [provider]  clopidogrel (PLAVIX) 75 MG tablet Take 1 tablet (75 mg total) by mouth daily with breakfast. 04/30/18   Georgie Chard D, NP  pantoprazole (PROTONIX) 40 MG tablet Take 1 tablet (40 mg total) by mouth daily. 04/30/18   Georgie Chard D, NP  rosuvastatin (CRESTOR) 10 MG tablet Take 10 mg by mouth daily.    [provider]    Physical Exam: Vitals:   08/13/18 2230 08/13/18 2320 08/14/18 0027 08/14/18 0032  BP: 116/81 (!) 131/94 124/89   Pulse: 93 96 (!) 101   Resp:   18   Temp:      TempSrc:      SpO2: 96% 96% 91%   Weight:    68 kg  Height:    5\' 2"  (1.575 m)    Constitutional: NAD, calm, comfortable Eyes: PERRL, lids and conjunctivae normal ENMT: Mucous membranes are moist. Posterior pharynx clear of any exudate or lesions.Normal dentition.  Neck: normal, supple, no masses, no thyromegaly Respiratory: clear to auscultation bilaterally, no wheezing, no crackles. Normal respiratory effort. No accessory muscle use.  Cardiovascular: Regular rate and rhythm, no murmurs / rubs / gallops. No extremity edema. 2+ pedal pulses. No carotid bruits.  Abdomen: no tenderness, no masses palpated. No hepatosplenomegaly. Bowel sounds positive.  Musculoskeletal: no clubbing /  cyanosis. No joint deformity upper and lower extremities. Good ROM, no contractures. Normal muscle tone.  Skin: no rashes, lesions, ulcers. No induration Neurologic: CN 2-12 grossly intact. Sensation intact, DTR normal. Strength 5/5 in all 4.  Psychiatric: Normal judgment and insight. Alert and oriented x 3. Normal mood.    Labs on Admission: I have personally reviewed following labs and imaging studies  CBC: Recent Labs  Lab 08/13/18 2233  WBC 11.9*  HGB 13.3  HCT 41.5  MCV 88.7  PLT 257   Basic Metabolic Panel: Recent Labs  Lab 08/13/18 2233  NA 140  K 3.8  CL 109  CO2  23  GLUCOSE 121*  BUN 21  CREATININE 0.80  CALCIUM 8.7*   GFR: Estimated Creatinine Clearance: 66 mL/min (by C-G formula based on SCr of 0.8 mg/dL). Liver Function Tests: No results for input(s): AST, ALT, ALKPHOS, BILITOT, PROT, ALBUMIN in the last 168 hours. No results for input(s): LIPASE, AMYLASE in the last 168 hours. No results for input(s): AMMONIA in the last 168 hours. Coagulation Profile: No results for input(s): INR, PROTIME in the last 168 hours. Cardiac Enzymes: No results for input(s): CKTOTAL, CKMB, CKMBINDEX, TROPONINI in the last 168 hours. BNP (last 3 results) No results for input(s): PROBNP in the last 8760 hours. HbA1C: No results for input(s): HGBA1C in the last 72 hours. CBG: No results for input(s): GLUCAP in the last 168 hours. Lipid Profile: No results for input(s): CHOL, HDL, LDLCALC, TRIG, CHOLHDL, LDLDIRECT in the last 72 hours. Thyroid Function Tests: No results for input(s): TSH, T4TOTAL, FREET4, T3FREE, THYROIDAB in the last 72 hours. Anemia Panel: No results for input(s): VITAMINB12, FOLATE, FERRITIN, TIBC, IRON, RETICCTPCT in the last 72 hours. Urine analysis: No results found for: COLORURINE, APPEARANCEUR, LABSPEC, PHURINE, GLUCOSEU, HGBUR, BILIRUBINUR, KETONESUR, PROTEINUR, UROBILINOGEN, NITRITE, LEUKOCYTESUR  Radiological Exams on Admission: Dg Ankle Complete Left  Result Date: 08/13/2018 CLINICAL DATA:  Recent fall with left ankle pain, initial encounter EXAM: LEFT ANKLE COMPLETE - 3+ VIEW COMPARISON:  None. FINDINGS: Soft tissue swelling is noted primarily medially. Some increased density is noted in the midportion of the calcaneus suspicious for an impacted fracture. Calcaneal spurring is noted. No other definitive fracture is seen. IMPRESSION: Changes suspicious for calcaneal fracture. CT may be helpful for further evaluation as clinically indicated. Electronically Signed   By: Alcide Clever M.D.   On: 08/13/2018 23:16   Ct Foot Left Wo  Contrast  Result Date: 08/14/2018 CLINICAL DATA:  Changes suggestive of calcaneal fracture on recent plain film EXAM: CT OF THE LEFT FOOT WITHOUT CONTRAST TECHNIQUE: Multidetector CT imaging of the left foot was performed according to the standard protocol. Multiplanar CT image reconstructions were also generated. COMPARISON:  Plain film examination obtained earlier in the same day. FINDINGS: Bones/Joint/Cartilage Comminuted fracture of the calcaneus is noted with impaction at the fracture site. This accounts for the increased density within the calcaneus seen on the recent exam. The talus appears within normal limits. Distal tibia and distal fibula are unremarkable. The remainder of the bony structures of the foot are within normal limits. Ligaments Suboptimally assessed by CT. Muscles and Tendons Surrounding musculature is within normal limits. Soft tissues Mild soft tissue swelling is noted related to the recent injury. IMPRESSION: Comminuted fracture of the left calcaneus with extension into the articular surface with the talus as well as significant impaction at the fracture site. Electronically Signed   By: Alcide Clever M.D.   On: 08/14/2018 00:30   Dg  Chest Port 1 View  Result Date: 08/14/2018 CLINICAL DATA:  Left shoulder and hip pain following fall, initial encounter EXAM: PORTABLE CHEST 1 VIEW COMPARISON:  02/06/2018 FINDINGS: Cardiac shadows within normal limits. Lungs are well aerated bilaterally. Comminuted left proximal humeral fracture is again noted. The underlying bony thorax is within normal limits. IMPRESSION: No active disease. Electronically Signed   By: Alcide Clever M.D.   On: 08/14/2018 00:04   Dg Shoulder Left  Result Date: 08/13/2018 CLINICAL DATA:  Recent fall with left shoulder pain EXAM: LEFT SHOULDER - 2+ VIEW COMPARISON:  None. FINDINGS: Comminuted fracture of the proximal left humerus is seen with impaction at the fracture site. Fracture site is primarily at the surgical  neck. The underlying bony thorax is within normal limits. Degenerative changes of the acromioclavicular joint are seen. IMPRESSION: Comminuted proximal left humeral fracture. Electronically Signed   By: Alcide Clever M.D.   On: 08/13/2018 23:17   Dg Humerus Left  Result Date: 08/13/2018 CLINICAL DATA:  Recent fall with left shoulder pain, initial encounter EXAM: LEFT HUMERUS - 2+ VIEW COMPARISON:  None. FINDINGS: Comminuted proximal left humeral fracture is noted with impaction at the fracture site. The fracture predominantly involves the surgical neck. The underlying bony thorax is within normal limits. Degenerative changes of the acromioclavicular joint are noted. IMPRESSION: Comminuted proximal left humeral fracture. Electronically Signed   By: Alcide Clever M.D.   On: 08/13/2018 23:12   Dg Hip Unilat W Or Wo Pelvis 2-3 Views Left  Result Date: 08/13/2018 CLINICAL DATA:  Recent fall with pelvic pain, initial encounter EXAM: DG HIP (WITH OR WITHOUT PELVIS) 2-3V LEFT COMPARISON:  None. FINDINGS: Pelvic ring is intact. Undisplaced intratrochanteric left femoral fracture is seen. No soft tissue abnormality is noted. IMPRESSION: Undisplaced left intratrochanteric femoral fracture. Electronically Signed   By: Alcide Clever M.D.   On: 08/13/2018 23:15    EKG: Independently reviewed.  Assessment/Plan Principal Problem:   Closed left hip fracture, initial encounter [facility]) Active Problems:   Essential hypertension   PAD (peripheral artery disease) (HCC)   Calcaneus fracture, left   Fracture of left humerus    1. Closed L hip fx - 1. Hip fx pathway 2. Ortho to eval in AM 3. NPO 4. CXR looks okay 5. EKG pending still 6. Stress test in Jan this year was nl.  If EKG okay, then likely good to proceed to surgery. 2. PAD - 1. Had angioplasty to RIGHT LE in Feb.  ABI was 1.01 on left. 2. Holding plavix tomorrow AM for procedure 3. Resume ASAP after procedure. 3. HTN - 1. Continue home BP meds 4. Fx  of left humerus and L calcaneus - 1. Splints in place 2. Ortho to see in AM  DVT prophylaxis: SCDs Code Status: Full Family Communication: No family in room Disposition Plan: CIR vs SNF Consults called: Dr. Dion Saucier Admission status: Admit to inpatient  Severity of Illness: The appropriate patient status for this patient is INPATIENT. Inpatient status is judged to be reasonable and necessary in order to provide the required intensity of service to ensure the patient's safety. The patient's presenting symptoms, physical exam findings, and initial radiographic and laboratory data in the context of their chronic comorbidities is felt to place them at high risk for further clinical deterioration. Furthermore, it is not anticipated that the patient will be medically stable for discharge from the hospital within 2 midnights of admission. The following factors support the patient status of inpatient.  IP status for surgical management of L hip fracture.  Also has L humerus and L calcaneus fractures complicating the picture.   * I certify that at the point of admission it is my clinical judgment that the patient will require inpatient hospital care spanning beyond 2 midnights from the point of admission due to high intensity of service, high risk for further deterioration and high frequency of surveillance required.*    Bruk Tumolo M. DO [facility]  How to contact the [facility] Attending or Consulting provider 7A - 7P or covering provider during after hours 7P -7A, for this patient?  1. Check the care team in [facility] and look for a) attending/consulting [facility] provider listed and b) the [facility] team listed 2. Log into [email or web address]  Amion Physician Scheduling and messaging for groups and whole hospitals  On call and physician scheduling software for group practices, residents, hospitalists and other medical providers for call, clinic, rotation and shift schedules. OnCall Enterprise is a hospital-wide  system for scheduling doctors and paging doctors on call. EasyPlot is for scientific plotting and data analysis.  [email or web address]  and use [place]'s universal password to access. If you do not have the password, please contact the hospital operator.  3. Locate the [facility] provider you are looking for under [facility] and page to a number that you can be directly reached. 4. If you still have difficulty reaching the provider, please page the [facility] (Director on Call) for the Hospitalists listed on amion for assistance.  08/14/2018, 1:06 AM

## 2018-08-14 NOTE — Anesthesia Procedure Notes (Addendum)
Procedure Name: Intubation Date/Time: 08/14/2018 4:46 PM Performed by: Elior Robinette T, CRNA Pre-anesthesia Checklist: Patient identified, Emergency Drugs available, Suction available and Patient being monitored Patient Re-evaluated:Patient Re-evaluated prior to induction Oxygen Delivery Method: Circle system utilized Preoxygenation: Pre-oxygenation with 100% oxygen Induction Type: IV induction and Rapid sequence Laryngoscope Size: Miller and 2 Grade View: Grade II Tube type: Oral Tube size: 7.5 mm Number of attempts: 1 Airway Equipment and Method: Patient positioned with wedge pillow and Stylet Placement Confirmation: ETT inserted through vocal cords under direct vision,  positive ETCO2 and breath sounds checked- equal and bilateral Secured at: 21 cm Tube secured with: Tape Dental Injury: Teeth and Oropharynx as per pre-operative assessment

## 2018-08-14 NOTE — Op Note (Signed)
Orthopaedic Surgery Operative Note (CSN: [id number] ) Date of Surgery: 08/14/2018  Admit Date: 08/13/2018   Diagnoses: Pre-Op Diagnoses: Left proximal humerus fracture Left intertrochanteric femur fracture Left calcaneus fracture   Post-Op Diagnosis: Same  Procedures: 1. CPT 27245-Cephalomedullary nailing of left intertrochanteric femur fracture 2. CPT 23615-Open reduction internal fixation of left proximal humerus fracture 3. CPT 28405-Closed treatment of left calcaneus fracture  Surgeons : Primary: Roby LoftsHaddix, Tylynn Braniff P, MD  Assistant: Ulyses SouthwardSarah Yacobi, PA-C  Location: OR 4   Anesthesia:General   Antibiotics: Ancef 2g preop   Tourniquet time:None    Estimated Blood Loss:200 mL  Complications:None   Specimens:None   Implants: Implant Name Type Inv. Item Serial No. Manufacturer Lot No. LRB No. Used  NAIL TROCH FIX [id number] 130 - [id number] Nail NAIL TROCH FIX [id number] 130  SYNTHES TRAUMA [id number] Left 1  BLADE HELICAL TFNA 90 - [id number] Anchor BLADE HELICAL TFNA 90  DEPUY ORTHOPAEDICS  Left 4  SCREW LOCK STAR 5X34 - [id number] Screw SCREW LOCK STAR 5X34  [employer]ES TRAUMA  Left 1  PLATE 3HOLE HUMERUS PROX RT - [id number] Plate PLATE 3HOLE HUMERUS PROX RT  ZIMMER RECON(ORTH,TRAU,BIO,SG)  Left 1  SCREW LOCK CORT STAR 3.5X52 - [id number] Screw SCREW LOCK CORT STAR 3.5X52  ZIMMER [employer]ON(ORTH,TRAU,BIO,SG)  Left 2  PEG LOCKING 3.2X36 - [id number] Screw PEG LOCKING 3.2X36  ZIMMER RECON(ORTH,TRAU,BIO,SG)  Left 2  PEG LOCKING 3.2X40 - [id number] Peg PEG LOCKING 3.2X40  ZIMMER RECON(ORTH,TRAU,BIO,SG)  Left 1  PEG LOCKING 3.2X32 - [id number] Peg PEG LOCKING 3.2X32  ZIMMER RECON(ORTH,TRAU,BIO,SG)  Left 1  SCREW LP NL T15 3.5X24 - [id number] Screw SCREW LP NL T15 3.5X24  ZIMMER RECON(ORTH,TRAU,BIO,SG)  Left 3     Indications for Surgery: 63 year old female who has a history of peripheral vascular disease who was up on her table dusting when she fell and landed on her left side.  She sustained a left  proximal humerus fracture, left intertrochanteric femur fracture and a left calcaneus fracture. I feel that cephalo-medullary nailing of her left hip fracture along with possible open reduction internal fixation of her proximal humerus is appropriate to allow for quick mobilization of her shoulder and hip.  In regards to her calcaneus I felt that it is relatively nondisplaced with no significant intra-articular step-off.  I discussed risks and benefits with the patient.  Risks included but not limited to bleeding, infection, malunion, nonunion, hardware failure, nerve and blood vessel injury, DVT, stiffness, screw cut out, subtalar arthritis, avascular necrosis of the humeral head, even the possibility of loss of life.  Operative Findings: 1.  Cephalo-medullary nailing of left intertrochanteric femur fracture using Synthes short TFN 10 mm in width. 2.  Open reduction internal fixation of left proximal humerus fracture using [employer] proximal humerus ALPS 3-hole plate 3.  Nonoperatively treated left calcaneus fracture with placement of short leg splint.  Procedure: The patient was identified in the preoperative holding area. Consent was confirmed with the patient and their family and all questions were answered. The operative extremity was marked after confirmation with the patient and they were then brought back to the operating room by our anesthesia colleagues. The patient was placed under general anesthesia and then carefully transferred over to a radiolucent flat top table. The operative extremity was then prepped and draped in usual sterile fashion. A preoperative timeout was performed to verify the patient, the procedure, and the extremity. Preoperative antibiotics were dosed.  Fluoroscopic images were obtained.  The knee was flexed over a triangle.  A small incision was made proximal to the greater trochanter. A curved Mayo scissors was used to spread down to the greater trochanter in line  with the abductor musculature. A threaded guidepin was positioned at an appropriate starting point on the AP and lateral views. It was advanced in the femur past the lesser trochanter. A entry reamer with soft tissue protector was then used to enter the canal. A radiographic ruler was used to judge the size of the canal of the femur and a 10 mm short nail was placed into the canal and seated down to an appropriate position radiographically. The targeting arm for the helical blade was attached. A percutaneous incision was made for the guide for the helical blade. A threaded guidepin was placed into the femoral neck and head and fluoroscopy was used to confirm adequate placement with an acceptable tip-apex distance. A drill was used to perforate the lateral cortex and 90 mm helical blade was inserted into the head/neck segment. The set screw was then tightened The aiming arm was removed from the nail and position of the helical blade was confirmed with fluoroscopy. Using the targeting arm a distal interlock was placed bicortically in the shaft.  Final fluoroscopic images were obtained and the incisions were copiously irrigated. The skin was closed with 2-0 vicryl, 3-0 monocryl and sealed with dermabond. The incisions were dressing with Mepilex dressings.  The drapes were then broken down and we turned our attention to the proximal humerus.  The left upper extremity was then prepped and draped in usual sterile fashion.  Another timeout was performed to verify the extremity.  A standard anterior lateral approach to the proximal humerus was made.  I carried through skin and subcutaneous tissue.  Identified the interval between the deltoid and pectoralis major.  Identified the cephalic vein and mobilized this laterally.  I then exposed the proximal humerus.  The periosteum was still intact and the whole fracture moved as a unit.  However there was some mild displacement.  Reduction maneuver was performed and  fluoroscopy was used to confirm adequate reduction.  I chose a 3-hole ALPS proximal humerus plate.  I used fluoroscopy to confirm adequate placement.  I provisionally held this in place with a K wire.  A nonlocking screw was placed in the humeral shaft to hold the placement of the plate.  I brought the plate flush to the bone proximally and then placed a locking screw to hold this reduction and I confirmed there is no penetration of the screws into the joint.  I placed another screw in the calcar of the proximal humerus.  I then placed a total of 4 pegs proximal to these 2 screws to complete the proximal construct.  I placed 2 more nonlocking screws into the humeral shaft.  Final fluoroscopic images were obtained.  I confirmed that all the screws were extra-articular.  I then irrigated the wound thoroughly.  I placed a gram of vancomycin powder.  I then closed the incision with 2-0 Vicryl and 3-0 Monocryl and sealed the skin with Dermabond.  A sterile Mepilex dressing was placed and the arm was placed back in a sling.  We then turned our attention to the left lower extremity.  A well-padded short leg splint was then placed.  The foot was kept in neutral.  She was then awoken from anesthesia and taken to the PACU in stable condition.  Post Op Plan/Instructions: The patient will be nonweightbearing on the left lower extremity and left  upper extremity.  She may use her right upper and right lower extremity for stand pivot transfers to a wheelchair.  She will receive postoperative Ancef.  She will receive Lovenox for DVT prophylaxis.  She may start gentle active and passive motion of the shoulder immediately.  I was present and performed the entire surgery.  Ulyses Southward, PA-C did assist me throughout the case. An assistant was necessary given the difficulty in approach, maintenance of reduction and ability to instrument the fracture.   Truitt Merle, MD [facility]

## 2018-08-14 NOTE — Anesthesia Procedure Notes (Signed)
Anesthesia Regional Block: Interscalene brachial plexus block   Pre-Anesthetic Checklist: ,, timeout performed, Correct Patient, Correct Site, Correct Laterality, Correct Procedure, Correct Position, site marked, Risks and benefits discussed,  Surgical consent,  Pre-op evaluation,  At surgeon's request and post-op pain management  Laterality: Left  Prep: chloraprep       Needles:  Injection technique: Single-shot  Needle Type: Echogenic Needle     Needle Length: 9cm      Additional Needles:   Procedures:,,,, ultrasound used (permanent image in chart),,,,  Narrative:  Start time: 08/14/2018 4:44 PM End time: 08/14/2018 4:54 PM Injection made incrementally with aspirations every 5 mL.  Performed by: Personally  Anesthesiologist: Eilene Ghazi, MD  Additional Notes: Patient tolerated the procedure well without complications

## 2018-08-14 NOTE — Consult Note (Signed)
Orthopaedic Trauma Service (OTS) Consult   Patient ID: Cynthia Hobbs MRN: [id number] DOB/AGE: 62-13-1957 62 y.o.  Reason for Consult: Left proximal humerus fracture, left calcaneus fracture, left intertrochanteric femur fracture Referring Physician: Dr. Dion Saucier  HPI: Cynthia Hobbs is an 63 y.o. female being seen in consultation at request of Dr. Dion Saucier for a left proximal humerus fracture, left calcaneus fracture, and left intertrochanteric femur fracture. Patient presents to ED following a fall off of a kitchen table while dusting last night.  Landed on her left side.  Had severe pain in shoulder, hip, and heel following fall, was unable to ambulate. Was found to have the above fracture on imaging in the ED. Orthopedics was consulted. Due to complexity of injuries and OR availability, Dr. Dion Saucier felt these fractures would best be managed by an orthopaedic traumatologist. Was placed in a short leg splint and sling in the ED and  patient was admitted to the hospitalist service overnight for planned surgical intervention today.  Patient evaluated this morning on 6N, pain has been decently well controlled overnight.. If she remains flat pain is manageable, worsens with movement. She denies any numbness or tingling in any of her extremities.   Patient is left hand dominant. 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 Lives in a mobile home with her mother in [place]. Has about 3 steps to get inside the home. Patient takes Plavix daily, last dose was on Monday evening. She is a current everyday smoker. Denies alcohol use.  Past Medical History:  Diagnosis Date  . GERD (gastroesophageal reflux disease)   . PAD (peripheral artery disease) (HCC)     Past Surgical History:  Procedure Laterality Date  . ABDOMINAL AORTOGRAM W/LOWER EXTREMITY Bilateral 04/29/2018   Procedure: ABDOMINAL AORTOGRAM  W/LOWER EXTREMITY;  Surgeon: Runell Gess, MD;  Location: [facility] INVASIVE CV LAB;  Service: Cardiovascular;  Laterality: Bilateral;  . BACK SURGERY    . BREAST EXCISIONAL BIOPSY Right    benign  . PERIPHERAL VASCULAR ATHERECTOMY Right 04/29/2018   Procedure: PERIPHERAL VASCULAR ATHERECTOMY;  Surgeon: Runell Gess, MD;  Location: [facility] INVASIVE CV LAB;  Service: Cardiovascular;  Laterality: Right;  SFA    Family History  Problem Relation Age of Onset  . Hypertension Mother   . Dementia Mother   . Cancer Father     Social History:  reports that she has been smoking cigarettes. She has never used smokeless tobacco. She reports that she does not drink alcohol or use drugs.  Allergies: No Known Allergies  Medications: I have reviewed the patient's current medications.  ROS: Constitutional: No fever or chills Vision: No changes in vision ENT: No difficulty swallowing CV: No chest pain Pulm: No SOB or wheezing GI: No nausea or vomiting GU: No urgency or inability to hold urine Skin: No poor wound healing Neurologic: No numbness or tingling Psychiatric: No depression or anxiety Heme: No bruising Allergic: No reaction to medications or food   Exam: Blood pressure (!) 137/96, pulse 100, temperature 98 F (36.7 C), temperature source Oral, resp. rate 16, height  (1.575 m), weight 68 kg, SpO2 97 %. General: Laying in bed, NAD Orientation: Alert and oriented x 3 Mood and Affect: Mood and affect appropriate Gait: Not assessed due to multiple known fractures Coordination and balance: within normal limits  Left upper extremity: Sling in place. Minimal swelling  noted in the arm of hand. Tenderness to palpation of anterior shoulder and proximal humerus. Less tender in elbow or forearm. Able to flex and extend the elbow some while in the sling. Shoulder range of motion not assessed. Full wrist range of motion. Able to wiggle fingers. Sensation intact distally. +radial pulse  Left  Lower extremity: Short leg splint in place. Non-tender to palpation of hip, thigh, knee. Did not assess knee ROM due to known femur fracture. Sensation intact to light touch of toes, knee and lateral thigh. Able to wiggle toes. Brisk cap refill less than 2 seconds.   Right Upper extremity: Skin without lesions. No tenderness to palpation. Full painless ROM, full strength in each muscle group without evidence of instability. Sensory and motor function intact. +radial pulse  Right Lower extremity: Skin without lesions. No tenderness to palpation. Full painless ROM, full strength in each muscle group without evidence of instability. Sensation and motor function intact. +DP pulse  Medical Decision Making: Imaging: Xray and CT of left foot showed comminuted fracture of the calcaneus with extension into the articular surface with the talus and with impaction at the fracture site  AP and lateral of left shoulder revealed acute comminuted proximal humerus fracture. Degenerative changes of the acromioclavicular joint are seen.  Xray of left hip with view of pelvis showed a non-displaced intertrochanteric femur fracture. Pelvic ring intact.  Labs:  Results for orders placed or performed during the hospital encounter of 08/13/18 (from the past 24 hour(s))  CBC     Status: Abnormal   Collection Time: 08/13/18 10:33 PM  Result Value Ref Range   WBC 11.9 (H) 4.0 - 10.5 K/uL   RBC 4.68 3.87 - 5.11 MIL/uL   Hemoglobin 13.3 12.0 - 15.0 g/dL   HCT 36.6 29.4 - 76.5 %   MCV 88.7 80.0 - 100.0 fL   MCH 28.4 26.0 - 34.0 pg   MCHC 32.0 30.0 - 36.0 g/dL   RDW 46.5 03.5 - 46.5 %   Platelets 257 150 - 400 K/uL   nRBC 0.0 0.0 - 0.2 %  Basic metabolic panel     Status: Abnormal   Collection Time: 08/13/18 10:33 PM  Result Value Ref Range   Sodium 140 135 - 145 mmol/L   Potassium 3.8 3.5 - 5.1 mmol/L   Chloride 109 98 - 111 mmol/L   CO2 23 22 - 32 mmol/L   Glucose, Bld 121 (H) 70 - 99 mg/dL   BUN 21 8 - 23  mg/dL   Creatinine, Ser 6.81 0.44 - 1.00 mg/dL   Calcium 8.7 (L) 8.9 - 10.3 mg/dL   GFR calc non Af Amer >60 >60 mL/min   GFR calc Af Amer >60 >60 mL/min   Anion gap 8 5 - 15  SARS Coronavirus 2 (CEPHEID - Performed in [facility] hospital lab), Hosp Order     Status: None   Collection Time: 08/14/18 12:29 AM  Result Value Ref Range   SARS Coronavirus 2 NEGATIVE NEGATIVE  Type and screen [facility]     Status: None   Collection Time: 08/14/18  1:29 AM  Result Value Ref Range   ABO/RH(D) A POS    Antibody Screen NEG    Sample Expiration      08/17/2018,2359 Performed at [facility], [address], [place], [place] [address]   ABO/Rh     Status: None   Collection Time: 08/14/18  1:29 AM  Result Value Ref Range  ABO/RH(D)      A POS Performed at [facility], [address] [place], [place] [address]      Medical history and chart was reviewed  Assessment/Plan: 63 year old female s/p fall from a height, resulting in several injuries including left proximal humerus fracture, left non-displaced intertrochanteric femur fracture, and left calcaneus fracture.  I would recommend proceeding with intramedullary nailing of left femur. Discussed risks and benefits of surgery with the patient. Risks discussed included bleeding requiring blood transfusion, bleeding causing a hematoma, infection, malunion, nonunion, damage to surrounding nerves and blood vessels, pain, hardware failure, stiffness, post-traumatic arthritis, DVT/PE. She would like to proceed with surgery of this.   We will plan to treat her calcaneus fracture non-operatively so she will be non-weightbearing on the leg for at least 6 weeks.   I also discussed operative vs non-operative treatment of her humerus fracture with the patient. 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 Will plan to evaluate the fracture under fluoro while in the  OR and determine if surgical intervention is the best treatment option. Patient is in agreement to this plan and wants to proceed with whatever will provide her with the best outcome and most pain relief.   Osie Amparo A. Ladonna SnideYacobi, PA-C [facility] ?[phone]? (phone)

## 2018-08-14 NOTE — Anesthesia Procedure Notes (Signed)
Anesthesia Procedure Image    

## 2018-08-14 NOTE — Interval H&P Note (Signed)
History and Physical Interval Note:  08/14/2018 3:59 PM  Cynthia Hobbs  has presented today for surgery, with the diagnosis of Left intertrochanteric femur fracture.  The various methods of treatment have been discussed with the patient and family. After consideration of risks, benefits and other options for treatment, the patient has consented to  Procedure(s): INTRAMEDULLARY (IM) NAIL INTERTROCHANTRIC (Left) as a surgical intervention.  The patient's history has been reviewed, patient examined, no change in status, stable for surgery.  I have reviewed the patient's chart and labs.  Questions were answered to the patient's satisfaction.     Caryn Bee P Adyn Hoes

## 2018-08-15 DIAGNOSIS — S72145A Nondisplaced intertrochanteric fracture of left femur, initial encounter for closed fracture: Principal | ICD-10-CM

## 2018-08-15 LAB — BASIC METABOLIC PANEL
Anion gap: 8 (ref 5–15)
BUN: 15 mg/dL (ref 8–23)
CO2: 27 mmol/L (ref 22–32)
Calcium: 8.3 mg/dL — ABNORMAL LOW (ref 8.9–10.3)
Chloride: 103 mmol/L (ref 98–111)
Creatinine, Ser: 0.77 mg/dL (ref 0.44–1.00)
GFR calc Af Amer: >60 mL/min (ref >60)
GFR calc non Af Amer: >60 mL/min (ref >60)
Glucose, Bld: 151 mg/dL — ABNORMAL HIGH (ref 70–99)
Potassium: 4.2 mmol/L (ref 3.5–5.1)
Sodium: 138 mmol/L (ref 135–145)

## 2018-08-15 LAB — PHOSPHORUS: Phosphorus: 2.9 mg/dL (ref 2.5–4.6)

## 2018-08-15 LAB — MAGNESIUM: Magnesium: 1.8 mg/dL (ref 1.7–2.4)

## 2018-08-15 LAB — CBC
HCT: 32.8 % — ABNORMAL LOW (ref 36.0–46.0)
Hemoglobin: 10.6 g/dL — ABNORMAL LOW (ref 12.0–15.0)
MCH: 28.6 pg (ref 26.0–34.0)
MCHC: 32.3 g/dL (ref 30.0–36.0)
MCV: 88.6 fL (ref 80.0–100.0)
Platelets: 163 10*3/uL (ref 150–400)
RBC: 3.7 MIL/uL — ABNORMAL LOW (ref 3.87–5.11)
RDW: 12.2 % (ref 11.5–15.5)
WBC: 10.7 10*3/uL — ABNORMAL HIGH (ref 4.0–10.5)
nRBC: 0 % (ref 0.0–0.2)

## 2018-08-15 LAB — PREALBUMIN: Prealbumin: 12.1 mg/dL — ABNORMAL LOW (ref 18–38)

## 2018-08-15 LAB — HEMOGLOBIN A1C
Hgb A1c MFr Bld: 5.1 % (ref 4.8–5.6)
Mean Plasma Glucose: 99.67 mg/dL

## 2018-08-15 LAB — TSH: TSH: 0.296 u[IU]/mL — ABNORMAL LOW (ref 0.350–4.500)

## 2018-08-15 LAB — ALBUMIN: Albumin: 2.8 g/dL — ABNORMAL LOW (ref 3.5–5.0)

## 2018-08-15 LAB — C-REACTIVE PROTEIN: CRP: 2.9 mg/dL — ABNORMAL HIGH (ref <1.0)

## 2018-08-15 LAB — SEDIMENTATION RATE: Sed Rate: 11 mm/hr (ref 0–22)

## 2018-08-15 MED ORDER — POLYETHYLENE GLYCOL 3350 17 G PO PACK: 17.0000 g | PACK | Freq: Every day | ORAL | Status: DC | PRN

## 2018-08-15 MED ORDER — SENNOSIDES-DOCUSATE SODIUM 8.6-50 MG PO TABS
1.0000 | ORAL_TABLET | Freq: Two times a day (BID) | ORAL | Status: DC
  Administered 2018-08-15 – 2018-08-17 (×5): 1 via ORAL
  Filled 2018-08-15 (×5): qty 1

## 2018-08-15 MED ORDER — GABAPENTIN 100 MG PO CAPS
100.0000 mg | ORAL_CAPSULE | Freq: Two times a day (BID) | ORAL | Status: DC
  Administered 2018-08-15 – 2018-08-17 (×4): 100 mg via ORAL
  Filled 2018-08-15 (×4): qty 1

## 2018-08-15 MED ORDER — CLOPIDOGREL BISULFATE 75 MG PO TABS
75.0000 mg | ORAL_TABLET | Freq: Every day | ORAL | Status: DC
  Administered 2018-08-15 – 2018-08-17 (×3): 75 mg via ORAL
  Filled 2018-08-15 (×3): qty 1

## 2018-08-15 MED ORDER — OXYCODONE-ACETAMINOPHEN 5-325 MG PO TABS
1.0000 | ORAL_TABLET | Freq: Four times a day (QID) | ORAL | Status: DC | PRN
  Administered 2018-08-15 – 2018-08-17 (×7): 2 via ORAL
  Filled 2018-08-15 (×7): qty 2

## 2018-08-15 NOTE — Evaluation (Signed)
Physical Therapy Evaluation Patient Details Name: Cynthia ReaderRobin E Hobbs MRN: [id number] DOB: 09/07/1955 Today's Date: 08/15/2018   History of Present Illness  Pt is a 63 y.o. female admitted 08/13/18 after falling off table, sustaining L intertrochanteric hip fx, L humerus fx, and non-displaced L calcaneal fx. S/p L femur nailing, L humerus ORIF, and closed tx of L calcaneus fx on 5/27. PMH includes PAD, GERD.    Clinical Impression  Pt presents with an overall decrease in functional mobility secondary to above. PTA, pt indep and lives with supportive family. Educ on precautions, positioning, therex, and importance of mobility. Today, pt able to perform squat pivot transfer bed<>recliner with modA (+2 assist for lines). Expect pt to progress well with mobility. Family will be able to provide physical assist if needed. Pt would benefit from continued acute PT services to maximize functional mobility and independence prior to d/c with HHPT services.     Follow Up Recommendations Home health PT;Supervision/Assistance - 24 hour    Equipment Recommendations  Wheelchair (measurements PT);Other (comment)(drop arm 3n1 bedside commode, tub-shower seat)    Recommendations for Other Services       Precautions / Restrictions Precautions Precautions: Fall Required Braces or Orthoses: Sling Restrictions Weight Bearing Restrictions: Yes LUE Weight Bearing: Non weight bearing LLE Weight Bearing: Non weight bearing      Mobility  Bed Mobility Overal bed mobility: Needs Assistance Bed Mobility: Supine to Sit     Supine to sit: Min assist     General bed mobility comments: MinA to assist LLE to EOB; RUE able to assist with bed rail  Transfers Overall transfer level: Needs assistance   Transfers: Squat Pivot Transfers     Squat pivot transfers: Mod assist;+2 safety/equipment     General transfer comment: Able to perform squat pivot transfer 2x from bed>recliner>drop arm recliner with RUE support  on rail and modA to assist hip. Both transfers towards R-side. Cues for sequencing/technique  Ambulation/Gait                Stairs            Wheelchair Mobility    Modified Rankin (Stroke Patients Only)       Balance Overall balance assessment: Needs assistance   Sitting balance-Leahy Scale: Good                                       Pertinent Vitals/Pain Pain Assessment: Faces Faces Pain Scale: Hurts little more Pain Location: LLE/LUE Pain Descriptors / Indicators: Guarding;Discomfort Pain Intervention(s): Limited activity within patient's tolerance;Monitored during session;Ice applied    Home Living Family/patient expects to be discharged to:: Private residence Living Arrangements: Parent;Children Available Help at Discharge: Family;Available 24 hours/day Type of Home: Mobile home Home Access: Stairs to enter(children working to build ramp) Entrance Stairs-Rails: Doctor, general practiceight;Left Entrance Stairs-Number of Steps: 3 Home Layout: One level Home Equipment: None      Prior Function Level of Independence: Independent         Comments: 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     Hand Dominance   Dominant Hand: Left    Extremity/Trunk Assessment   Upper Extremity Assessment Upper Extremity Assessment: LUE deficits/detail LUE Deficits / Details: S/p L humeral ORIF; hand, wrist and elbow ROM WFL LUE: Unable to fully assess due to pain    Lower Extremity Assessment Lower Extremity Assessment: LLE deficits/detail LLE Deficits /  Details: S/p L femoral nail, calcaneus fx; hip/knee functional strength at least 3/5 LLE: Unable to fully assess due to pain       Communication   Communication: No difficulties  Cognition Arousal/Alertness: Awake/alert Behavior During Therapy: WFL for tasks assessed/performed Overall Cognitive Status: Within Functional Limits for tasks assessed                                         General Comments      Exercises General Exercises - Upper Extremity Shoulder Flexion: AAROM;Left;5 reps;Seated Shoulder ABduction: AAROM;Left;5 reps;Seated Elbow Flexion: AROM;Left;10 reps;Seated Elbow Extension: AROM;Left;10 reps;Seated Wrist Flexion: AROM;Left;10 reps;Seated Wrist Extension: AROM;Left;10 reps;Seated Digit Composite Flexion: AROM;Left;10 reps;Seated Composite Extension: AROM;Left;10 reps;Seated Donning/doffing sling/immobilizer: Maximal assistance Correct positioning of sling/immobilizer: Maximal assistance ROM for elbow, wrist and digits of operated UE: Supervision/safety   Assessment/Plan    PT Assessment Patient needs continued PT services  PT Problem List Decreased strength;Decreased range of motion;Decreased activity tolerance;Decreased balance;Decreased knowledge of use of DME;Decreased knowledge of precautions;Pain;Decreased mobility       PT Treatment Interventions DME instruction;Gait training;Stair training;Functional mobility training;Therapeutic activities;Therapeutic exercise;Patient/family education;Wheelchair mobility training;Balance training    PT Goals (Current goals can be found in the Care Plan section)  Acute Rehab PT Goals Patient Stated Goal: to go home to grandbaby PT Goal Formulation: With patient Time For Goal Achievement: 08/29/18 Potential to Achieve Goals: Good    Frequency Min 5X/week   Barriers to discharge        Co-evaluation PT/OT/SLP Co-Evaluation/Treatment: Yes Reason for Co-Treatment: For patient/therapist safety;To address functional/ADL transfers PT goals addressed during session: Mobility/safety with mobility OT goals addressed during session: ADL's and self-care       AM-PAC PT "6 Clicks" Mobility  Outcome Measure Help needed turning from your back to your side while in a flat bed without using bedrails?: A Little Help needed moving from lying on your back to sitting on the side of a flat  bed without using bedrails?: A Little Help needed moving to and from a bed to a chair (including a wheelchair)?: A Lot Help needed standing up from a chair using your arms (e.g., wheelchair or bedside chair)?: A Lot Help needed to walk in hospital room?: Total Help needed climbing 3-5 steps with a railing? : Total 6 Click Score: 12    End of Session Equipment Utilized During Treatment: Gait belt;Other (comment)(LUE sling) Activity Tolerance: Patient tolerated treatment well Patient left: in chair;with call bell/phone within reach Nurse Communication: Mobility status PT Visit Diagnosis: Other abnormalities of gait and mobility (R26.89);Pain Pain - Right/Left: Left Pain - part of body: Shoulder;Arm;Leg;Ankle and joints of foot    Time: 1340-1418 PT Time Calculation (min) (ACUTE ONLY): 38 min   Charges:   PT Evaluation $PT Eval Moderate Complexity: 1 Mod     Ina Homes, PT, DPT Acute Rehabilitation Services  Pager [phone] Office [phone]  Malachy Chamber 08/15/2018, 3:34 PM

## 2018-08-15 NOTE — Anesthesia Postprocedure Evaluation (Signed)
Anesthesia Post Note  Patient: Cynthia Hobbs  Procedure(s) Performed: INTRAMEDULLARY (IM) NAIL INTERTROCHANTRIC (Left Leg Upper) Open Reduction Internal Fixation (Orif) Proximal Humerus Fracture (Left Shoulder) APPLICATION OF SPLINT LEFT LOWER LEG (Left Leg Lower)     Patient location during evaluation: PACU Anesthesia Type: General Level of consciousness: awake and alert Pain management: pain level controlled Vital Signs Assessment: post-procedure vital signs reviewed and stable Respiratory status: spontaneous breathing, respiratory function stable and patient connected to face mask oxygen Cardiovascular status: blood pressure returned to baseline and stable Postop Assessment: no apparent nausea or vomiting Anesthetic complications: no Comments:  Postoperative CXR confirmed elevated left hemidiaphragm due to probable phrenic involvement with interscalene block. Patient subjectively SOB, though oxygenating well with facemask oxygen. Discussed phrenic involvement with nerve block to patient, expect this issue will resolve as the block wears off. Patient expressed understanding.    Last Vitals:  Vitals:   08/14/18 2343 08/15/18 0441  BP: 107/79 108/74  Pulse: 88 83  Resp: 17 18  Temp: 37.1 C 37.1 C  SpO2: 91% (!) 79%    Last Pain:  Vitals:   08/15/18 0441  TempSrc: Oral  PainSc:                  Beryle Lathe

## 2018-08-15 NOTE — Plan of Care (Signed)
  Problem: Education: Goal: Knowledge of General Education information will improve Description: Including pain rating scale, medication(s)/side effects and non-pharmacologic comfort measures Outcome: Progressing   Problem: Health Behavior/Discharge Planning: Goal: Ability to manage health-related needs will improve Outcome: Progressing   Problem: Clinical Measurements: Goal: Ability to maintain clinical measurements within normal limits will improve Outcome: Progressing Goal: Will remain free from infection Outcome: Progressing Goal: Diagnostic test results will improve Outcome: Progressing Goal: Respiratory complications will improve Outcome: Progressing   Problem: Nutrition: Goal: Adequate nutrition will be maintained Outcome: Progressing   Problem: Coping: Goal: Level of anxiety will decrease Outcome: Progressing   Problem: Pain Managment: Goal: General experience of comfort will improve Outcome: Progressing   Problem: Safety: Goal: Ability to remain free from injury will improve Outcome: Progressing   Problem: Skin Integrity: Goal: Risk for impaired skin integrity will decrease Outcome: Progressing   

## 2018-08-15 NOTE — Plan of Care (Signed)

## 2018-08-15 NOTE — Discharge Instructions (Addendum)
[facility] Discharge Instructions   General Discharge Instructions  WEIGHT BEARING STATUS: Non-weightbearing left arm and non-weightbearing on left leg.   RANGE OF MOTION/ACTIVITY: Molli KnockOkay to remove sling to work on gentle range of motion of shoulder. Full range of motion of left hip and knee.  Wound Care: Left hip and left arm incisions can be left open to air, no dressing or bandage is needed. Do not remove or get wet splint on left leg.  DVT/PE prophylaxis: Eliquis  Diet: as you were eating previously.  Can use over the counter stool softeners and bowel preparations, such as Miralax, to help with bowel movements.  Narcotics can be constipating.  Be sure to drink plenty of fluids  PAIN MEDICATION USE AND EXPECTATIONS  You have likely been given narcotic medications to help control your pain.  After a traumatic event that results in an fracture (broken bone) with or without surgery, it is ok to use narcotic pain medications to help control one's pain.  We understand that everyone responds to pain differently and each individual patient will be evaluated on a regular basis for the continued need for narcotic medications. Ideally, narcotic medication use should last no more than 6-8 weeks (coinciding with fracture healing).   As a patient it is your responsibility as well to monitor narcotic medication use and report the amount and frequency you use these medications when you come to your office visit.   We would also advise that if you are using narcotic medications, you should take a dose prior to therapy to maximize you participation.  IF YOU ARE ON NARCOTIC MEDICATIONS IT IS NOT PERMISSIBLE TO OPERATE A MOTOR VEHICLE (MOTORCYCLE/CAR/TRUCK/MOPED) OR HEAVY MACHINERY DO NOT MIX NARCOTICS WITH OTHER CNS (CENTRAL NERVOUS SYSTEM) DEPRESSANTS SUCH AS ALCOHOL   STOP SMOKING OR USING NICOTINE PRODUCTS!!!!  As discussed nicotine severely impairs your body's ability to heal surgical  and traumatic wounds but also impairs bone healing.  Wounds and bone heal by forming microscopic blood vessels (angiogenesis) and nicotine is a vasoconstrictor (essentially, shrinks blood vessels).  Therefore, if vasoconstriction occurs to these microscopic blood vessels they essentially disappear and are unable to deliver necessary nutrients to the healing tissue.  This is one modifiable factor that you can do to dramatically increase your chances of healing your injury.    (This means no smoking, no nicotine gum, patches, etc)  DO NOT USE NONSTEROIDAL ANTI-INFLAMMATORY DRUGS (NSAID'S)  Using products such as Advil (ibuprofen), Aleve (naproxen), Motrin (ibuprofen) for additional pain control during fracture healing can delay and/or prevent the healing response.  If you would like to take over the counter (OTC) medication, Tylenol (acetaminophen) is ok.  However, some narcotic medications that are given for pain control contain acetaminophen as well. Therefore, you should not exceed more than 4000 mg of tylenol in a day if you do not have liver disease.  Also note that there are may OTC medicines, such as cold medicines and allergy medicines that my contain tylenol as well.  If you have any questions about medications and/or interactions please ask your doctor/PA or your pharmacist.      ICE AND ELEVATE INJURED/OPERATIVE EXTREMITY  Using ice and elevating the injured extremity above your heart can help with swelling and pain control.  Icing in a pulsatile fashion, such as 20 minutes on and 20 minutes off, can be followed.    Do not place ice directly on skin. Make sure there is a barrier between to skin and  the ice pack.    Using frozen items such as frozen peas works well as the conform nicely to the are that needs to be iced.  USE AN ACE WRAP OR TED HOSE FOR SWELLING CONTROL  In addition to icing and elevation, Ace wraps or TED hose are used to help limit and resolve swelling.  It is recommended to  use Ace wraps or TED hose until you are informed to stop.    When using Ace Wraps start the wrapping distally (farthest away from the body) and wrap proximally (closer to the body)   Example: If you had surgery on your leg or thing and you do not have a splint on, start the ace wrap at the toes and work your way up to the thigh        If you had surgery on your upper extremity and do not have a splint on, start the ace wrap at your fingers and work your way up to the upper arm  IF YOU ARE IN A SPLINT OR CAST DO NOT REMOVE IT FOR ANY REASON   If your splint gets wet for any reason please contact the office immediately. You may shower in your splint or cast as long as you keep it dry.  This can be done by wrapping in a cast cover or garbage back (or similar)  Do Not stick any thing down your splint or cast such as pencils, money, or hangers to try and scratch yourself with.  If you feel itchy take benadryl as prescribed on the bottle for itching  IF YOU ARE IN A CAM BOOT (BLACK BOOT)  You may remove boot periodically. Perform daily dressing changes as noted below.  Wash the liner of the boot regularly and wear a sock when wearing the boot. It is recommended that you sleep in the boot until told otherwise   CALL THE OFFICE WITH ANY QUESTIONS OR CONCERNS: [phone]   VISIT OUR WEBSITE FOR ADDITIONAL INFORMATION: [email or web address]    Discharge Wound Care Instructions  Do NOT apply any ointments, solutions or lotions to pin sites or surgical wounds.  These prevent needed drainage and even though solutions like hydrogen peroxide kill bacteria, they also damage cells lining the pin sites that help fight infection.  Applying lotions or ointments can keep the wounds moist and can cause them to breakdown and open up as well. This can increase the risk for infection. When in doubt call the office.  Surgical incisions should be dressed daily.  If any drainage is noted, use one layer of adaptic, then  gauze, Kerlix, and an ace wrap.  Once the incision is completely dry and without drainage, it may be left open to air out.  Showering may begin 36-48 hours later.  Cleaning gently with soap and water.  Traumatic wounds should be dressed daily as well.    One layer of adaptic, gauze, Kerlix, then ace wrap.  The adaptic can be discontinued once the draining has ceased    If you have a wet to dry dressing: wet the gauze with saline the squeeze as much saline out so the gauze is moist (not soaking wet), place moistened gauze over wound, then place a dry gauze over the moist one, followed by Kerlix wrap, then ace wrap.

## 2018-08-15 NOTE — Progress Notes (Signed)
Orthopaedic Trauma Progress Note  S: Doing fairly well this morning, is sore which is expected but pain is well controlled on current regimen. Has already been able to move her shoulder some, she is glad we decided to proceed with surgery on the humerus. We discussed her surgeries yesterday and plan for non-weightbearing on the left leg for at least 6-8 weeks and the need for a wheelchair for mobility. She would prefer to not go to any kind of rehab facility due to current coronavirus. States she has plenty of assistance at home but wants to do whatever is recommended by the doctors and therapists. Have ordered several labs this morning to evaluate metabolic bone health. Would imagine patient is vitamin D deficient, will start her on vitamin D supplement this AM.   O:  Vitals:   08/14/18 2343 08/15/18 0441  BP: 107/79 108/74  Pulse: 88 83  Resp: 17 18  Temp: 98.7 F (37.1 C) 98.7 F (37.1 C)  SpO2: 91% (!) 79%    General - Laying in bed, resting comfortably. NAD  Cardiac - Heart regular rate and rhythm  Respiratory - No increased work of breathing but frequent coughing. Lungs CTA anterior lung fields bilaterally.  Left Upper Extremity - Sling in place. Dressing clean, dry, intact. Bruising over anterior shoulder and upper arm. Tender to palpation of anterior shoulder and upper arm. Non-tender over elbow or in forearm. Able to get some forward elevation of the shoulder. Excellent elbow and wrist ROM. Sensation intact, wiggles fingers. + radial pulse.  Left Lower Extremity - Short leg splint in place. Dressings over hip clean, dry, intact.  Mild tenderness with palpation of hip and lateral thigh. Non-tender in the knee or lower leg. Cannot actively lift leg but passive hip and knee flexion without significant discomfort. Sensation intact to light tough. Able to wiggle toes.   Imaging: Stable post op imaging  Labs:  Results for orders placed or performed during the hospital encounter of  08/13/18 (from the past 24 hour(s))  CBC     Status: Abnormal   Collection Time: 08/15/18  2:32 AM  Result Value Ref Range   WBC 10.7 (H) 4.0 - 10.5 K/uL   RBC 3.70 (L) 3.87 - 5.11 MIL/uL   Hemoglobin 10.6 (L) 12.0 - 15.0 g/dL   HCT 28.0 (L) 03.4 - 91.7 %   MCV 88.6 80.0 - 100.0 fL   MCH 28.6 26.0 - 34.0 pg   MCHC 32.3 30.0 - 36.0 g/dL   RDW 91.5 05.6 - 97.9 %   Platelets 163 150 - 400 K/uL   nRBC 0.0 0.0 - 0.2 %  Basic metabolic panel     Status: Abnormal   Collection Time: 08/15/18  2:32 AM  Result Value Ref Range   Sodium 138 135 - 145 mmol/L   Potassium 4.2 3.5 - 5.1 mmol/L   Chloride 103 98 - 111 mmol/L   CO2 27 22 - 32 mmol/L   Glucose, Bld 151 (H) 70 - 99 mg/dL   BUN 15 8 - 23 mg/dL   Creatinine, Ser 4.80 0.44 - 1.00 mg/dL   Calcium 8.3 (L) 8.9 - 10.3 mg/dL   GFR calc non Af Amer >60 >60 mL/min   GFR calc Af Amer >60 >60 mL/min   Anion gap 8 5 - 15  Prealbumin     Status: Abnormal   Collection Time: 08/15/18  2:32 AM  Result Value Ref Range   Prealbumin 12.1 (L) 18 - 38 mg/dL  Phosphorus     Status: None   Collection Time: 08/15/18  2:32 AM  Result Value Ref Range   Phosphorus 2.9 2.5 - 4.6 mg/dL  Magnesium     Status: None   Collection Time: 08/15/18  2:32 AM  Result Value Ref Range   Magnesium 1.8 1.7 - 2.4 mg/dL  TSH     Status: Abnormal   Collection Time: 08/15/18  2:32 AM  Result Value Ref Range   TSH 0.296 (L) 0.350 - 4.500 uIU/mL  Sedimentation rate     Status: None   Collection Time: 08/15/18  2:32 AM  Result Value Ref Range   Sed Rate 11 0 - 22 mm/hr  C-reactive protein     Status: Abnormal   Collection Time: 08/15/18  2:32 AM  Result Value Ref Range   CRP 2.9 (H) <1.0 mg/dL  Hemoglobin [id number]     Status: None   Collection Time: 08/15/18  2:32 AM  Result Value Ref Range   Hgb A1c MFr Bld 5.1 4.8 - 5.6 %   Mean Plasma Glucose 99.67 mg/dL  Albumin     Status: Abnormal   Collection Time: 08/15/18  2:32 AM  Result Value Ref Range   Albumin 2.8 (L)  3.5 - 5.0 g/dL    Assessment: 63 year old female s/p fall  Injuries: 1. Left proximal humerus fracture s/p ORIF 08/14/18 2. Left intertrochanteric femur fracture s/p IMN 08/14/18 3. Left calcaneus fracture treated non-op  Weightbearing: NWB LUE, NWB LLE  Insicional and dressing care: Dressings clean, dry, intact. Will remove dressing tomorrow. Keep splint in place  Orthopedic device(s): Sling LUE, splint LLE  CV/Blood loss: Acute blood loss anemia, Hgb 10.7 this AM. Hemodynamically stable  Pain management:  1. Tylenol 650 mg q 6 hours scheduled 2. Robaxin 500 mg q 6 hours PRN 3. Oxycodone 5-10 mg q 4 hours PRN 4. Neurontin 100 mg TID 5. Dilaudid 1 mg q 3 hours PRN  VTE prophylaxis: Lovenox 40 mg starting today  ID: Ancef 2gm post op  Foley/Lines: Foley in place, will discontinue today once patient works with PT.  Medical co-morbidities: HTN, PAD, HLD, tobacco abuse  Impediments to Fracture Healing: Polytrauma, tobacco abuse  Dispo: PT/OT eval, dispo pending  Follow - up plan: 2 weeks   Meleny Tregoning A. Ladonna SnideYacobi, PA-C [facility] ?[phone]? (phone)

## 2018-08-15 NOTE — Telephone Encounter (Signed)
Attempted call to pt to inform that Dr. Allyson Sabal is ok with pt postponing LEAs until February 2021. [email or web address]

## 2018-08-15 NOTE — Progress Notes (Signed)
PROGRESS NOTE    Cynthia Hobbs  WNU:[id number] DOB: [birth year] DOA: 08/13/2018 PCP: Merri Brunette, MD  Brief Narrative: 63 year old female with history of longstanding tobacco abuse, PAD and right lower extremity status post angioplasty in 2/202 0 on Plavix was admitted earlier this morning following a mechanical fall off a kitchen table while dusting her chandelier. -In the emergency room she was noted to have a left hip intertrochanteric fracture, left humerus fracture and left ankle fracture   Assessment & Plan:   Left hip fracture, left humerus fracture and left ankle fracture -Following a mechanical fall -Orthopedics consulted, underwent ORIF of left femur, humerus and closed reduction of left calcaneus fracture yesterday 5/27 by Dr. Jena Gauss -Appears to be stable overall postop -Start physical therapy today, restarted Plavix -Lovenox for DVT prophylaxis  Postop blood loss anemia -Mild, monitor  Essential hypertension -BP stable, not on meds  History of PAD/right lower extremity -Balloon angioplasty 2/202 0 -Restarted Plavix, continue statin  Tobacco abuse/suspected COPD -Counseled  DVT prophylaxis: Lovenox Code Status: Full code Family Communication: No family at bedside Disposition Plan: Home in 1 to 2 days  Consultants:   Orthopedics  Procedures: Procedures: 5/27 1. CPT 27245-Cephalomedullary nailing of left intertrochanteric femur fracture 2. CPT 23615-Open reduction internal fixation of left proximal humerus fracture 3. CPT 28405-Closed treatment of left calcaneus fracture    Antimicrobials:    Subjective: -Continues to complain of discomfort in her left humerus and left hip  Objective: Vitals:   08/15/18 0441 08/15/18 1045 08/15/18 1436 08/15/18 1500  BP: 108/74 108/62 96/68   Pulse: 83 96 (!) 110   Resp: 18  18   Temp: 98.7 F (37.1 C) 98.8 F (37.1 C) 99 F (37.2 C)   TempSrc: Oral Oral Oral   SpO2: (!) 79% 98% (!) 84% 95%  Weight:        Height:        Intake/Output Summary (Last 24 hours) at 08/15/2018 1537 Last data filed at 08/15/2018 0500 Gross per 24 hour  Intake 1195.83 ml  Output 700 ml  Net 495.83 ml   Filed Weights   08/14/18 0032  Weight: 68 kg    Examination:  General exam: AAO x3, no distress Respiratory system: Distant breath sounds, decreased at the bases, rest clear Cardiovascular system: S1 & S2 heard, RRR.  Gastrointestinal system: Abdomen is nondistended, soft and nontender.Normal bowel sounds heard. Central nervous system: Alert and oriented. No focal neurological deficits. Extremities: Left hip with dressing, left humerus in splint, left ankle with dressing Skin: No rashes, lesions or ulcers Psychiatry: Judgement and insight appear normal. Mood & affect appropriate.     Data Reviewed:   CBC: Recent Labs  Lab 08/13/18 2233 08/15/18 0232  WBC 11.9* 10.7*  HGB 13.3 10.6*  HCT 41.5 32.8*  MCV 88.7 88.6  PLT 257 163   Basic Metabolic Panel: Recent Labs  Lab 08/13/18 2233 08/15/18 0232  NA 140 138  K 3.8 4.2  CL 109 103  CO2 23 27  GLUCOSE 121* 151*  BUN 21 15  CREATININE 0.80 0.77  CALCIUM 8.7* 8.3*  MG  --  1.8  PHOS  --  2.9   GFR: Estimated Creatinine Clearance: 66 mL/min (by C-G formula based on SCr of 0.77 mg/dL). Liver Function Tests: Recent Labs  Lab 08/15/18 0232  ALBUMIN 2.8*   No results for input(s): LIPASE, AMYLASE in the last 168 hours. No results for input(s): AMMONIA in the last 168 hours. Coagulation Profile: No results  for input(s): INR, PROTIME in the last 168 hours. Cardiac Enzymes: No results for input(s): CKTOTAL, CKMB, CKMBINDEX, TROPONINI in the last 168 hours. BNP (last 3 results) No results for input(s): PROBNP in the last 8760 hours. HbA1C: Recent Labs    08/15/18 0232  HGBA1C 5.1   CBG: No results for input(s): GLUCAP in the last 168 hours. Lipid Profile: No results for input(s): CHOL, HDL, LDLCALC, TRIG, CHOLHDL, LDLDIRECT in  the last 72 hours. Thyroid Function Tests: Recent Labs    08/15/18 0232  TSH 0.296*   Anemia Panel: No results for input(s): VITAMINB12, FOLATE, FERRITIN, TIBC, IRON, RETICCTPCT in the last 72 hours. Urine analysis: No results found for: COLORURINE, APPEARANCEUR, LABSPEC, PHURINE, GLUCOSEU, HGBUR, BILIRUBINUR, KETONESUR, PROTEINUR, UROBILINOGEN, NITRITE, LEUKOCYTESUR Sepsis Labs: @LABRCNTIP (procalcitonin:4,lacticidven:4)  ) Recent Results (from the past 240 hour(s))  SARS Coronavirus 2 (CEPHEID - Performed in [facility] hospital lab), Hosp Order     Status: None   Collection Time: 08/14/18 12:29 AM  Result Value Ref Range Status   SARS Coronavirus 2 NEGATIVE NEGATIVE Final    Comment: (NOTE) If result is NEGATIVE SARS-CoV-2 target nucleic acids are NOT DETECTED. The SARS-CoV-2 RNA is generally detectable in upper and lower  respiratory specimens during the acute phase of infection. The lowest  concentration of SARS-CoV-2 viral copies this assay can detect is 250  copies / mL. A negative result does not preclude SARS-CoV-2 infection  and should not be used as the sole basis for treatment or other  patient management decisions.  A negative result may occur with  improper specimen collection / handling, submission of specimen other  than nasopharyngeal swab, presence of viral mutation(s) within the  areas targeted by this assay, and inadequate number of viral copies  (<250 copies / mL). A negative result must be combined with clinical  observations, patient history, and epidemiological information. If result is POSITIVE SARS-CoV-2 target nucleic acids are DETECTED. The SARS-CoV-2 RNA is generally detectable in upper and lower  respiratory specimens dur ing the acute phase of infection.  Positive  results are indicative of active infection with SARS-CoV-2.  Clinical  correlation with patient history and other diagnostic information is  necessary to determine patient infection  status.  Positive results do  not rule out bacterial infection or co-infection with other viruses. If result is PRESUMPTIVE POSTIVE SARS-CoV-2 nucleic acids MAY BE PRESENT.   A presumptive positive result was obtained on the submitted specimen  and confirmed on repeat testing.  While 2019 novel coronavirus  (SARS-CoV-2) nucleic acids may be present in the submitted sample  additional confirmatory testing may be necessary for epidemiological  and / or clinical management purposes  to differentiate between  SARS-CoV-2 and other Sarbecovirus currently known to infect humans.  If clinically indicated additional testing with an alternate test  methodology [id number]) is advised. The SARS-CoV-2 RNA is generally  detectable in upper and lower respiratory sp ecimens during the acute  phase of infection. The expected result is Negative. Fact Sheet for Patients:  BoilerBrush.com.cy Fact Sheet for Healthcare Providers: https://pope.com/ This test is not yet approved or cleared by the [place] FDA and has been authorized for detection and/or diagnosis of SARS-CoV-2 by FDA under an Emergency Use Authorization (EUA).  This EUA will remain in effect (meaning this test can be used) for the duration of the COVID-19 declaration under Section 564(b)(1) of the Act, 21 U.S.C. section 360bbb-3(b)(1), unless the authorization is terminated or revoked sooner. Performed at [facility],  [address], [place], [place] [address]   Surgical PCR screen     Status: None   Collection Time: 08/14/18  5:48 AM  Result Value Ref Range Status   MRSA, PCR NEGATIVE NEGATIVE Final   Staphylococcus aureus NEGATIVE NEGATIVE Final    Comment: (NOTE) The Xpert SA Assay (FDA approved for NASAL specimens in patients 57 years of age and older), is one component of a comprehensive surveillance program. It is not intended to diagnose infection nor to guide or monitor  treatment. Performed at [facility], [address], [place], [place] [address]          Radiology Studies: Dg Ankle Complete Left  Result Date: 08/13/2018 CLINICAL DATA:  Recent fall with left ankle pain, initial encounter EXAM: LEFT ANKLE COMPLETE - 3+ VIEW COMPARISON:  None. FINDINGS: Soft tissue swelling is noted primarily medially. Some increased density is noted in the midportion of the calcaneus suspicious for an impacted fracture. Calcaneal spurring is noted. No other definitive fracture is seen. IMPRESSION: Changes suspicious for calcaneal fracture. CT may be helpful for further evaluation as clinically indicated. Electronically Signed   By: Alcide Clever M.D.   On: 08/13/2018 23:16   Ct Foot Left Wo Contrast  Result Date: 08/14/2018 CLINICAL DATA:  Changes suggestive of calcaneal fracture on recent plain film EXAM: CT OF THE LEFT FOOT WITHOUT CONTRAST TECHNIQUE: Multidetector CT imaging of the left foot was performed according to the standard protocol. Multiplanar CT image reconstructions were also generated. COMPARISON:  Plain film examination obtained earlier in the same day. FINDINGS: Bones/Joint/Cartilage Comminuted fracture of the calcaneus is noted with impaction at the fracture site. This accounts for the increased density within the calcaneus seen on the recent exam. The talus appears within normal limits. Distal tibia and distal fibula are unremarkable. The remainder of the bony structures of the foot are within normal limits. Ligaments Suboptimally assessed by CT. Muscles and Tendons Surrounding musculature is within normal limits. Soft tissues Mild soft tissue swelling is noted related to the recent injury. IMPRESSION: Comminuted fracture of the left calcaneus with extension into the articular surface with the talus as well as significant impaction at the fracture site. Electronically Signed   By: Alcide Clever M.D.   On: 08/14/2018 00:30   Dg Chest Port 1 View  Result  Date: 08/14/2018 CLINICAL DATA:  Shortness of breath status post surgery EXAM: PORTABLE CHEST 1 VIEW COMPARISON:  08/13/2018 FINDINGS: The lung volumes are low. There is postoperative atelectasis at the lung bases. There is mild elevation of the left hemidiaphragm of unknown clinical significance. The patient is status post ORIF of the proximal left humerus. The alignment appears improved. There is no pneumothorax. No large pleural effusion. IMPRESSION: Low lung volumes with postoperative atelectasis. Mild elevation of the left hemidiaphragm. Electronically Signed   By: Katherine Mantle M.D.   On: 08/14/2018 19:56   Dg Chest Port 1 View  Result Date: 08/14/2018 CLINICAL DATA:  Left shoulder and hip pain following fall, initial encounter EXAM: PORTABLE CHEST 1 VIEW COMPARISON:  02/06/2018 FINDINGS: Cardiac shadows within normal limits. Lungs are well aerated bilaterally. Comminuted left proximal humeral fracture is again noted. The underlying bony thorax is within normal limits. IMPRESSION: No active disease. Electronically Signed   By: Alcide Clever M.D.   On: 08/14/2018 00:04   Dg Shoulder Left  Result Date: 08/14/2018 CLINICAL DATA:  Left humerus fracture EXAM: LEFT SHOULDER - 2+ VIEW COMPARISON:  08/13/2018 FINDINGS: Three low  resolution intraoperative spot views of the left shoulder. Total fluoroscopy time was 2 minutes 30 seconds. The images demonstrate surgical plate and multiple screw fixation of comminuted proximal humerus fracture with anatomic alignment. IMPRESSION: Intraoperative fluoroscopic assistance provided during surgical fixation of left humerus fracture Electronically Signed   By: Jasmine PangKim  Fujinaga M.D.   On: 08/14/2018 19:21   Dg Shoulder Left  Result Date: 08/13/2018 CLINICAL DATA:  Recent fall with left shoulder pain EXAM: LEFT SHOULDER - 2+ VIEW COMPARISON:  None. FINDINGS: Comminuted fracture of the proximal left humerus is seen with impaction at the fracture site. Fracture site is  primarily at the surgical neck. The underlying bony thorax is within normal limits. Degenerative changes of the acromioclavicular joint are seen. IMPRESSION: Comminuted proximal left humeral fracture. Electronically Signed   By: Alcide CleverMark  Lukens M.D.   On: 08/13/2018 23:17   Dg Humerus Left  Result Date: 08/14/2018 CLINICAL DATA:  Humerus fracture EXAM: LEFT HUMERUS - 2+ VIEW COMPARISON:  08/13/2018 FINDINGS: Interval surgical plate and multiple screw fixation of comminuted proximal humerus fracture with minimal residual displacement. Gas in the soft tissues consistent with recent operative status. Borderline widened appearance of the [facility] joint. IMPRESSION: Surgically fixated proximal humerus fracture with expected postoperative changes Electronically Signed   By: Jasmine PangKim  Fujinaga M.D.   On: 08/14/2018 20:16   Dg Humerus Left  Result Date: 08/13/2018 CLINICAL DATA:  Recent fall with left shoulder pain, initial encounter EXAM: LEFT HUMERUS - 2+ VIEW COMPARISON:  None. FINDINGS: Comminuted proximal left humeral fracture is noted with impaction at the fracture site. The fracture predominantly involves the surgical neck. The underlying bony thorax is within normal limits. Degenerative changes of the acromioclavicular joint are noted. IMPRESSION: Comminuted proximal left humeral fracture. Electronically Signed   By: Alcide CleverMark  Lukens M.D.   On: 08/13/2018 23:12   Dg C-arm 1-60 Min  Result Date: 08/14/2018 CLINICAL DATA:  Intertrochanteric fracture EXAM: OPERATIVE left HIP (WITH PELVIS IF PERFORMED) 4 VIEWS TECHNIQUE: Fluoroscopic spot image(s) were submitted for interpretation post-operatively. COMPARISON:  08/13/2018 FINDINGS: Four low resolution intraoperative spot views of the left hip. Total fluoroscopy time was 2 minutes 30 seconds. The images demonstrate intramedullary rod and screw fixation of the left femur for nondisplaced intertrochanteric fracture. IMPRESSION: Intraoperative fluoroscopic assistance provided  during surgical fixation of left femur fracture Electronically Signed   By: Jasmine PangKim  Fujinaga M.D.   On: 08/14/2018 19:20   Dg Hip Operative Unilat With Pelvis Left  Result Date: 08/14/2018 CLINICAL DATA:  Intertrochanteric fracture EXAM: OPERATIVE left HIP (WITH PELVIS IF PERFORMED) 4 VIEWS TECHNIQUE: Fluoroscopic spot image(s) were submitted for interpretation post-operatively. COMPARISON:  08/13/2018 FINDINGS: Four low resolution intraoperative spot views of the left hip. Total fluoroscopy time was 2 minutes 30 seconds. The images demonstrate intramedullary rod and screw fixation of the left femur for nondisplaced intertrochanteric fracture. IMPRESSION: Intraoperative fluoroscopic assistance provided during surgical fixation of left femur fracture Electronically Signed   By: Jasmine PangKim  Fujinaga M.D.   On: 08/14/2018 19:20   Dg Hip Unilat W Or W/o Pelvis 2-3 Views Left  Result Date: 08/14/2018 CLINICAL DATA:  Postop hip fracture EXAM: DG HIP (WITH OR WITHOUT PELVIS) 2-3V LEFT COMPARISON:  08/13/2018 FINDINGS: Pubic symphysis and rami appear intact. Interval intramedullary rod and distal screw fixation of left intertrochanteric fracture. Gas in the soft tissues consistent with recent surgery. IMPRESSION: Interval surgical fixation of left intertrochanteric fracture with expected postoperative changes. Electronically Signed   By: Jasmine PangKim  Fujinaga M.D.   On: 08/14/2018  20:22   Dg Hip Unilat W Or Wo Pelvis 2-3 Views Left  Result Date: 08/13/2018 CLINICAL DATA:  Recent fall with pelvic pain, initial encounter EXAM: DG HIP (WITH OR WITHOUT PELVIS) 2-3V LEFT COMPARISON:  None. FINDINGS: Pelvic ring is intact. Undisplaced intratrochanteric left femoral fracture is seen. No soft tissue abnormality is noted. IMPRESSION: Undisplaced left intratrochanteric femoral fracture. Electronically Signed   By: Alcide Clever M.D.   On: 08/13/2018 23:15        Scheduled Meds:  acetaminophen  650 mg Oral Q6H   cholecalciferol   4,000 Units Oral Daily   clopidogrel  75 mg Oral Daily   enoxaparin (LOVENOX) injection  40 mg Subcutaneous Q24H   feeding supplement (ENSURE ENLIVE)  237 mL Oral Q1400   gabapentin  100 mg Oral TID   multivitamin with minerals  1 tablet Oral Daily   pantoprazole  40 mg Oral Q1200   rosuvastatin  10 mg Oral q1800   senna-docusate  1 tablet Oral BID   Continuous Infusions:   ceFAZolin (ANCEF) IV 2 g (08/15/18 0615)     LOS: 1 day    Time spent:    Zannie Cove, MD [facility] Page via [email or web address], password [email or web address] After 7PM please contact night-coverage  08/15/2018, 3:37 PM

## 2018-08-15 NOTE — Progress Notes (Signed)
Occupational Therapy Evaluation Patient Details Name: Cynthia Hobbs MRN: [id number] DOB: 08/24/1955 Today's Date: 08/15/2018    History of Present Illness Pt is a 63 y.o. female admitted 08/13/18 after falling off table, sustaining L intertrochanteric hip fx, L humerus fx, and non-displaced L calcaneal fx. S/p L femur nailing, L humerus ORIF, and closed tx of L calcaneus fx on 5/27. PMH includes PAD, GERD.   Clinical Impression   PTA, pt is independent with ADLs and functional mobility. She lives at home with her mother (who is in good health) and her daughters live close by (one daughter lives behind her).  Pt presenting with decreased independence with ADLs and functional mobility due to pain and NWB status on Left UE/LE. Pt requiring mod-max assist for UB/LB ADLs and +2 mod assist for squat pivot transfer to recliner. +2 assist today primarily for safety with lines. Recommending HHOT for discharge planning. Will continue to follow acutely in order to maximize safety and independence with ADLs at home.     Follow Up Recommendations  Home health OT;Supervision/Assistance - 24 hour    Equipment Recommendations  Tub/shower seat;Wheelchair (measurements OT);Wheelchair cushion (measurements OT);Other (comment)(drop arm 3n1 bedside commode)    Recommendations for Other Services       Precautions / Restrictions Precautions Precautions: Fall Required Braces or Orthoses: Sling Restrictions Weight Bearing Restrictions: Yes LUE Weight Bearing: Non weight bearing LLE Weight Bearing: Non weight bearing      Mobility Bed Mobility Overal bed mobility: Needs Assistance Bed Mobility: Supine to Sit     Supine to sit: Min assist     General bed mobility comments: VCs for sequencing. Use of bed rail.  Assist to support left LE and to scoot left hip toward EOB.  Transfers Overall transfer level: Needs assistance   Transfers: Squat Pivot Transfers     Squat pivot transfers: +2  safety/equipment;Mod assist     General transfer comment: +2 assist for safety with lines. +2 mod assist to transfer from bed to recliner and then from recliner to drop arm recliner.  Both transfers toward right side. VCs for sequencing and hand placement.    Balance                                           ADL either performed or assessed with clinical judgement   ADL Overall ADL's : Needs assistance/impaired Eating/Feeding: Set up;Sitting   Grooming: Minimal assistance;Sitting   Upper Body Bathing: Moderate assistance;Sitting   Lower Body Bathing: Moderate assistance;Sitting/lateral leans   Upper Body Dressing : Maximal assistance;Sitting   Lower Body Dressing: Maximal assistance;Sitting/lateral leans   Toilet Transfer: +2 for safety/equipment;Moderate assistance;Squat-pivot Toilet Transfer Details (indicate cue type and reason): simulated          Functional mobility during ADLs: +2 for safety/equipment;Moderate assistance General ADL Comments: Max assist to don/doff sling.     Vision         Perception     Praxis      Pertinent Vitals/Pain Pain Assessment: Faces Faces Pain Scale: Hurts little more Pain Location: LLE/LUE Pain Descriptors / Indicators: Guarding;Discomfort Pain Intervention(s): Limited activity within patient's tolerance;Repositioned;Ice applied     Hand Dominance Left   Extremity/Trunk Assessment Upper Extremity Assessment Upper Extremity Assessment: LUE deficits/detail LUE Deficits / Details: Hand, wrist and elbow ROM WFL.  LUE: Unable to fully assess due to pain  Lower Extremity Assessment Lower Extremity Assessment: Defer to PT evaluation       Communication Communication Communication: No difficulties   Cognition Arousal/Alertness: Awake/alert Behavior During Therapy: WFL for tasks assessed/performed Overall Cognitive Status: Within Functional Limits for tasks assessed                                      General Comments       Exercises Exercises: Shoulder;General Upper Extremity General Exercises - Upper Extremity Shoulder Flexion: AAROM;Left;5 reps;Seated Shoulder ABduction: AAROM;Left;5 reps;Seated Elbow Flexion: AROM;Left;10 reps;Seated Elbow Extension: AROM;Left;10 reps;Seated Wrist Flexion: AROM;Left;10 reps;Seated Wrist Extension: AROM;Left;10 reps;Seated Digit Composite Flexion: AROM;Left;10 reps;Seated Composite Extension: AROM;Left;10 reps;Seated   Shoulder Instructions Shoulder Instructions Donning/doffing sling/immobilizer: Maximal assistance Correct positioning of sling/immobilizer: Maximal assistance ROM for elbow, wrist and digits of operated UE: Supervision/safety    Home Living Family/patient expects to be discharged to:: Private residence Living Arrangements: Parent;Children Available Help at Discharge: Family;Available 24 hours/day Type of Home: Mobile home Home Access: Stairs to enter(her children are working on ramp though) Secretary/administratorntrance Stairs-Number of Steps: 3 Entrance Stairs-Rails: Right;Left Home Layout: One level     Bathroom Shower/Tub: Tub/shower unit;Walk-in shower   Bathroom Toilet: Standard Bathroom Accessibility: Yes How Accessible: Accessible via wheelchair Home Equipment: None          Prior Functioning/Environment Level of Independence: Independent        Comments: 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        OT Problem List: Decreased strength;Decreased range of motion;Decreased activity tolerance;Impaired balance (sitting and/or standing);Decreased knowledge of use of DME or AE;Decreased knowledge of precautions;Pain;Impaired UE functional use      OT Treatment/Interventions: Self-care/ADL training;Therapeutic exercise;DME and/or AE instruction;Therapeutic activities;Patient/family education;Balance training    OT Goals(Current goals can be found in the care plan section) Acute Rehab OT  Goals Patient Stated Goal: to go home OT Goal Formulation: With patient Time For Goal Achievement: 08/29/18 Potential to Achieve Goals: Good  OT Frequency: Min 3X/week   Barriers to D/C:            Co-evaluation PT/OT/SLP Co-Evaluation/Treatment: Yes Reason for Co-Treatment: For patient/therapist safety;To address functional/ADL transfers   OT goals addressed during session: ADL's and self-care      AM-PAC OT "6 Clicks" Daily Activity     Outcome Measure Help from another person eating meals?: A Little Help from another person taking care of personal grooming?: A Little Help from another person toileting, which includes using toliet, bedpan, or urinal?: A Lot Help from another person bathing (including washing, rinsing, drying)?: A Lot Help from another person to put on and taking off regular upper body clothing?: A Lot Help from another person to put on and taking off regular lower body clothing?: A Lot 6 Click Score: 14   End of Session Equipment Utilized During Treatment: Gait belt(left sling) Nurse Communication: Mobility status;Other (comment)(IV site leaking)  Activity Tolerance: Patient tolerated treatment well Patient left: in chair;with call bell/phone within reach;with nursing/sitter in room  OT Visit Diagnosis: Unsteadiness on feet (R26.81);Muscle weakness (generalized) (M62.81);Other abnormalities of gait and mobility (R26.89);Pain Pain - Right/Left: Left Pain - part of body: Shoulder;Arm;Knee;Leg                Time: 1340-1418 OT Time Calculation (min): 38 min Charges:  OT General Charges $OT Visit: 1 Visit OT Evaluation $OT Eval Moderate Complexity: 1 Mod  OT Treatments $Therapeutic Exercise: 8-22 mins    Cipriano Mile OTR/L 08/15/2018, 2:30 PM

## 2018-08-16 DIAGNOSIS — S72145A Nondisplaced intertrochanteric fracture of left femur, initial encounter for closed fracture: Secondary | ICD-10-CM | POA: Diagnosis not present

## 2018-08-16 LAB — BASIC METABOLIC PANEL
Anion gap: 7 (ref 5–15)
BUN: 23 mg/dL (ref 8–23)
CO2: 27 mmol/L (ref 22–32)
Calcium: 8.4 mg/dL — ABNORMAL LOW (ref 8.9–10.3)
Chloride: 103 mmol/L (ref 98–111)
Creatinine, Ser: 0.74 mg/dL (ref 0.44–1.00)
GFR calc Af Amer: >60 mL/min (ref >60)
GFR calc non Af Amer: >60 mL/min (ref >60)
Glucose, Bld: 111 mg/dL — ABNORMAL HIGH (ref 70–99)
Potassium: 4.2 mmol/L (ref 3.5–5.1)
Sodium: 137 mmol/L (ref 135–145)

## 2018-08-16 LAB — CBC
HCT: 29.6 % — ABNORMAL LOW (ref 36.0–46.0)
Hemoglobin: 9.2 g/dL — ABNORMAL LOW (ref 12.0–15.0)
MCH: 27.8 pg (ref 26.0–34.0)
MCHC: 31.1 g/dL (ref 30.0–36.0)
MCV: 89.4 fL (ref 80.0–100.0)
Platelets: 150 10*3/uL (ref 150–400)
RBC: 3.31 MIL/uL — ABNORMAL LOW (ref 3.87–5.11)
RDW: 12.1 % (ref 11.5–15.5)
WBC: 10.9 10*3/uL — ABNORMAL HIGH (ref 4.0–10.5)
nRBC: 0 % (ref 0.0–0.2)

## 2018-08-16 LAB — CALCIUM, IONIZED: Calcium, Ionized, Serum: 4.8 mg/dL (ref 4.5–5.6)

## 2018-08-16 LAB — T4, FREE: Free T4: 1.2 ng/dL (ref 0.82–1.77)

## 2018-08-16 LAB — VITAMIN D 25 HYDROXY (VIT D DEFICIENCY, FRACTURES): Vit D, 25-Hydroxy: 43.1 ng/mL (ref 30.0–100.0)

## 2018-08-16 MED ORDER — ACETAMINOPHEN 325 MG PO TABS: 650.0000 mg | ORAL_TABLET | Freq: Four times a day (QID) | ORAL | Status: DC | PRN

## 2018-08-16 NOTE — Progress Notes (Signed)
Occupational Therapy Treatment Patient Details Name: Cynthia Hobbs MRN: [id number] DOB: June 24, 1955 Today's Date: 08/16/2018    History of present illness Pt is a 63 y.o. female admitted 08/13/18 after falling off table, sustaining L intertrochanteric hip fx, L humerus fx, and non-displaced L calcaneal fx. S/p L femur nailing, L humerus ORIF, and closed tx of L calcaneus fx on 5/27. PMH includes PAD, GERD.   OT comments  Pt requiring less assist with squat pivot transfer today (min assist to right side) but continues to be nervous about transfers (states she is worried about falling).  Improved with bed mobility today.  Her children are working on building the ramp at home today and pt hopes to discharge home tomorrow.  Therapist returning later today for second treatment session to focus on left UE HEP and UB ADLs.  Follow Up Recommendations  Home health OT;Supervision/Assistance - 24 hour    Equipment Recommendations  Tub/shower seat;Wheelchair (measurements OT);Wheelchair cushion (measurements OT);Other (comment)(drop arm 3n1 )    Recommendations for Other Services      Precautions / Restrictions Precautions Precautions: Fall Required Braces or Orthoses: Sling Restrictions Weight Bearing Restrictions: Yes LUE Weight Bearing: Non weight bearing LLE Weight Bearing: Non weight bearing       Mobility Bed Mobility Overal bed mobility: Needs Assistance Bed Mobility: Supine to Sit     Supine to sit: Min guard;HOB elevated     General bed mobility comments: Pt c/o left LE feeling "heavy" but was successful with moving it OOB without physical assist. Use of bed rail to help pull herself over and to elevate trunk.  Transfers Overall transfer level: Needs assistance   Transfers: Squat Pivot Transfers     Squat pivot transfers: Min assist     General transfer comment: Assist to transfer to right side. Pt able to recall hand placement from yesterday's session but still has  difficulty with reaching all the way across chair to armrest with right UE.  Pt preferred to rest her right UE around therapist waist during transfer.    Balance                                           ADL either performed or assessed with clinical judgement   ADL Overall ADL's : Needs assistance/impaired     Grooming: Brushing hair;Minimal assistance;Sitting Grooming Details (indicate cue type and reason): Using left UE (wrist ROM) to help comb hair.                 Toilet Transfer: Minimal Soil scientist Details (indicate cue type and reason): simulated           General ADL Comments: Pt completed transfer from bed to drop arm recliner with min assist.  Therapist educated pt on use of 3n1 at home for toileting and for showering.      Vision       Perception     Praxis      Cognition Arousal/Alertness: Awake/alert Behavior During Therapy: WFL for tasks assessed/performed Overall Cognitive Status: Within Functional Limits for tasks assessed                                          Exercises     Shoulder Instructions Shoulder Instructions Donning/doffing sling/immobilizer:  Moderate assistance     General Comments      Pertinent Vitals/ Pain       Pain Assessment: Faces Faces Pain Scale: Hurts even more Pain Location: LLE/LUE Pain Descriptors / Indicators: Guarding;Discomfort Pain Intervention(s): Limited activity within patient's tolerance;Repositioned  Home Living                                          Prior Functioning/Environment              Frequency  Min 3X/week        Progress Toward Goals  OT Goals(current goals can now be found in the care plan section)  Progress towards OT goals: Progressing toward goals  Acute Rehab OT Goals Patient Stated Goal: to go home to grandbaby OT Goal Formulation: With patient Time For Goal Achievement:  08/29/18 Potential to Achieve Goals: Good ADL Goals Pt Will Perform Upper Body Bathing: sitting;with min assist(independent with directing caregiver) Pt Will Perform Lower Body Bathing: with min guard assist;sitting/lateral leans Pt Will Perform Upper Body Dressing: with min assist;sitting(independent with directing caregiver) Pt Will Perform Lower Body Dressing: with min assist;sitting/lateral leans(independent with directing caregiver) Pt Will Transfer to Toilet: with min guard assist;squat pivot transfer;bedside commode Pt Will Perform Toileting - Clothing Manipulation and hygiene: with min guard assist;sitting/lateral leans Pt Will Perform Tub/Shower Transfer: Shower transfer;with min guard assist;Squat pivot transfer;shower seat Additional ADL Goal #1: Pt will be able to independently complete left UE HEP.  Plan Discharge plan remains appropriate;Frequency remains appropriate    Co-evaluation                 AM-PAC OT "6 Clicks" Daily Activity     Outcome Measure   Help from another person eating meals?: A Little Help from another person taking care of personal grooming?: A Little Help from another person toileting, which includes using toliet, bedpan, or urinal?: A Little Help from another person bathing (including washing, rinsing, drying)?: A Lot Help from another person to put on and taking off regular upper body clothing?: A Lot Help from another person to put on and taking off regular lower body clothing?: A Lot 6 Click Score: 15    End of Session Equipment Utilized During Treatment: Gait belt(left sling)  OT Visit Diagnosis: Unsteadiness on feet (R26.81);Muscle weakness (generalized) (M62.81);Other abnormalities of gait and mobility (R26.89);Pain Pain - Right/Left: Left Pain - part of body: Shoulder;Arm;Knee;Leg   Activity Tolerance Patient tolerated treatment well   Patient Left in chair;with call bell/phone within reach;with nursing/sitter in room   Nurse  Communication Mobility status        Time: [id number] OT Time Calculation (min): 21 min  Charges: OT General Charges $OT Visit: 1 Visit OT Treatments $Self Care/Home Management : 8-22 mins     Cipriano MileJohnson, Jenna Elizabeth OTR/L 08/16/2018, 11:10 AM

## 2018-08-16 NOTE — Progress Notes (Signed)
Orthopaedic Trauma Progress Note  S: Doing well, pain well controlled. Was able to work with therapy yesterday and has been working on shoulder ROM in bed. Will plan to work with them again today. Trying to remain positive about current situation given she will be non-weightbearing on her left leg for several weeks. Again states she does not want to go to any kind of rehab facility but is nervous about going home, as she currently lives in a mobile home and is not sure if a wheelchair would fit through the doorways there.   O:  Vitals:   08/15/18 2345 08/16/18 0435  BP: 113/71 108/77  Pulse: 78 75  Resp: 17 18  Temp: 98.6 F (37 C) 99.2 F (37.3 C)  SpO2: 98% 96%    General - Sitting up in bed eating breakfast.  NAD  Left Upper Extremity - Sling in place. Dressing removed, incision clean, dry, intact. Bruising over anterior shoulder and upper arm. Tender to palpation of anterior shoulder and upper arm. Non-tender over elbow or in forearm. Able to get some forward elevation of the shoulder. Excellent elbow and wrist ROM. Sensation intact, wiggles fingers. + radial pulse.  Left Lower Extremity - Short leg splint in place. Dressings over hip removed, incisions clean, dry, intact.  Minimal tenderness with palpation of hip and lateral thigh. Non-tender in the knee or lower leg. Cannot actively lift leg but passive hip and knee flexion without significant discomfort. Sensation intact to light tough. Able to wiggle toes.   Imaging: Stable post op imaging  Labs:  Results for orders placed or performed during the hospital encounter of 08/13/18 (from the past 24 hour(s))  CBC     Status: Abnormal   Collection Time: 08/16/18  2:40 AM  Result Value Ref Range   WBC 10.9 (H) 4.0 - 10.5 K/uL   RBC 3.31 (L) 3.87 - 5.11 MIL/uL   Hemoglobin 9.2 (L) 12.0 - 15.0 g/dL   HCT 16.129.6 (L) 09.636.0 - 04.546.0 %   MCV 89.4 80.0 - 100.0 fL   MCH 27.8 26.0 - 34.0 pg   MCHC 31.1 30.0 - 36.0 g/dL   RDW 40.912.1 81.111.5 - 91.415.5 %    Platelets 150 150 - 400 K/uL   nRBC 0.0 0.0 - 0.2 %  Basic metabolic panel     Status: Abnormal   Collection Time: 08/16/18  2:40 AM  Result Value Ref Range   Sodium 137 135 - 145 mmol/L   Potassium 4.2 3.5 - 5.1 mmol/L   Chloride 103 98 - 111 mmol/L   CO2 27 22 - 32 mmol/L   Glucose, Bld 111 (H) 70 - 99 mg/dL   BUN 23 8 - 23 mg/dL   Creatinine, Ser 7.820.74 0.44 - 1.00 mg/dL   Calcium 8.4 (L) 8.9 - 10.3 mg/dL   GFR calc non Af Amer >60 >60 mL/min   GFR calc Af Amer >60 >60 mL/min   Anion gap 7 5 - 15  T4, free     Status: None   Collection Time: 08/16/18  2:40 AM  Result Value Ref Range   Free T4 1.20 0.82 - 1.77 ng/dL    Assessment: 63 year old female s/p fall  Injuries: 1. Left proximal humerus fracture s/p ORIF 08/14/18 2. Left intertrochanteric femur fracture s/p IMN 08/14/18 3. Left calcaneus fracture treated non-op  Weightbearing: NWB LUE, NWB LLE  Insicional and dressing care: Incisions can be left open to air. Keep splint in place  Orthopedic device(s):  Sling LUE, splint LLE  CV/Blood loss: Acute blood loss anemia, Hgb 9.2 this AM. Hemodynamically stable. Continue to monitor  Pain management:  1. Tylenol 650 mg q 6 hours scheduled 2. Robaxin 500 mg q 6 hours PRN 3. Percocet 5-325 q 6 hours PRN 4. Neurontin 100 mg TID 5. Dilaudid 1 mg q 3 hours PRN  VTE prophylaxis: Lovenox 40 mg   ID: Ancef 2gm post op completed  Foley/Lines: Foley removed, KVO IVFs  Medical co-morbidities: HTN, PAD, HLD, tobacco abuse  Impediments to Fracture Healing: Polytrauma, tobacco abuse  Dispo: PT/OT eval, okay for discharge from orthopaedic perspective once cleared by medicine team and therapies  Follow - up plan: 2 weeks   Stein Windhorst A. Ladonna Snide [facility] ?[phone]? (phone)

## 2018-08-16 NOTE — TOC Benefit Eligibility Note (Signed)
Transition of Care [facility]) Benefit Eligibility Note    Patient Details  Name: Cynthia Hobbs MRN: [id number] Date of Birth: May 05, 1955   Medication/Dose: Everlene Balls  2.5 MG BID  Covered?: Yes     Prescription Coverage Preferred Pharmacy: [facility])  Spoke with Person/Company/Phone Number:: KAREN([employer] # [phone])  Co-Pay: ZERO DOLLARS  Prior Approval: No     Additional Notes: LOVENOX 40MG  SQ DAILY ,COVER- NOT COVER, P/A-YES # [id number](ENOXAPARIN 40 MG SQ DAILY, COVER- YES, CO-PAY- ZERO DOLLARS  TIER-NO, P/A-NOMardene Sayer Phone Number: 08/16/2018, 4:40 PM

## 2018-08-16 NOTE — Plan of Care (Signed)
  Problem: Pain Managment: Goal: General experience of comfort will improve Outcome: Progressing   Problem: Safety: Goal: Ability to remain free from injury will improve Outcome: Progressing   Problem: Skin Integrity: Goal: Risk for impaired skin integrity will decrease Outcome: Progressing   

## 2018-08-16 NOTE — Progress Notes (Signed)
PROGRESS NOTE    Cynthia Hobbs  XFG:[id number] DOB: 1955/12/19 DOA: 08/13/2018 PCP: Merri Brunette, MD  Brief Narrative: 63 year old female with history of longstanding tobacco abuse, PAD and right lower extremity status post angioplasty in 2/202 0 on Plavix was admitted earlier this morning following a mechanical fall off a kitchen table while dusting her chandelier. -In the emergency room she was noted to have a left hip intertrochanteric fracture, left humerus fracture and left ankle fracture   Assessment & Plan:   Left hip fracture, left humerus fracture and left ankle fracture -Following a mechanical fall -Orthopedics consulted, underwent IM nail of left femur, ORIF of L humerus and closed reduction of left calcaneus fracture 5/27 by Dr. Jena Gauss -Started physical therapy, patient declines rehab, plan for home with home health services -Currently on Lovenox for DVT prophylaxis, asked case management to determine cost of apixaban 2.5 mg twice daily for 1 month versus Lovenox 40 mg subcu daily -Pain control still remains challenging, continue Percocet as needed, Robaxin, IV Dilaudid for severe pain -Discharge planning, home tomorrow -Close orthopedic follow-up given 3 fractures  Postop blood loss anemia -Mild, monitor  Essential hypertension -BP stable, not on meds  History of PAD/right lower extremity -Balloon angioplasty 04/2018 -Restarted Plavix, continue statin  Tobacco abuse/suspected COPD -Counseled  DVT prophylaxis: Lovenox Code Status: Full code Family Communication: No family at bedside Disposition Plan: Home tomorrow if stable  Consultants:   Orthopedics  Procedures: Procedures: 5/27 1. CPT 27245-Cephalomedullary nailing of left intertrochanteric femur fracture 2. CPT 23615-Open reduction internal fixation of left proximal humerus fracture 3. CPT 28405-Closed treatment of left calcaneus fracture    Antimicrobials:    Subjective: -Overall improving  continues to have severe discomfort in her left hip and left humerus requiring IV Dilaudid frequently  Objective: Vitals:   08/15/18 2003 08/15/18 2345 08/16/18 0435 08/16/18 1438  BP: 107/73 113/71 108/77 94/66  Pulse: 96 78 75 92  Resp: 18 17 18 17   Temp: 98.7 F (37.1 C) 98.6 F (37 C) 99.2 F (37.3 C) 98.2 F (36.8 C)  TempSrc: Oral Oral Oral Oral  SpO2: 97% 98% 96% 98%  Weight:      Height:        Intake/Output Summary (Last 24 hours) at 08/16/2018 1544 Last data filed at 08/16/2018 1300 Gross per 24 hour  Intake 480 ml  Output 800 ml  Net -320 ml   Filed Weights   08/14/18 0032  Weight: 68 kg    Examination:  Gen: Awake, Alert, Oriented X 3, no distress HEENT: PERRLA, Neck supple, no JVD Lungs: Distant breath sounds, otherwise clear CVS: RRR,No Gallops,Rubs or new Murmurs Abd: soft, Non tender, non distended, BS present Extremities: Left hip with dressing, left humerus with dressing and splint, left ankle with dressing Skin: no new rashes Psychiatry: Judgement and insight appear normal. Mood & affect appropriate.     Data Reviewed:   CBC: Recent Labs  Lab 08/13/18 2233 08/15/18 0232 08/16/18 0240  WBC 11.9* 10.7* 10.9*  HGB 13.3 10.6* 9.2*  HCT 41.5 32.8* 29.6*  MCV 88.7 88.6 89.4  PLT 257 163 150   Basic Metabolic Panel: Recent Labs  Lab 08/13/18 2233 08/15/18 0232 08/16/18 0240  NA 140 138 137  K 3.8 4.2 4.2  CL 109 103 103  CO2 23 27 27   GLUCOSE 121* 151* 111*  BUN 21 15 23   CREATININE 0.80 0.77 0.74  CALCIUM 8.7* 8.3* 8.4*  MG  --  1.8  --  PHOS  --  2.9  --    GFR: Estimated Creatinine Clearance: 66 mL/min (by C-G formula based on SCr of 0.74 mg/dL). Liver Function Tests: Recent Labs  Lab 08/15/18 0232  ALBUMIN 2.8*   No results for input(s): LIPASE, AMYLASE in the last 168 hours. No results for input(s): AMMONIA in the last 168 hours. Coagulation Profile: No results for input(s): INR, PROTIME in the last 168 hours.  Cardiac Enzymes: No results for input(s): CKTOTAL, CKMB, CKMBINDEX, TROPONINI in the last 168 hours. BNP (last 3 results) No results for input(s): PROBNP in the last 8760 hours. HbA1C: Recent Labs    08/15/18 0232  HGBA1C 5.1   CBG: No results for input(s): GLUCAP in the last 168 hours. Lipid Profile: No results for input(s): CHOL, HDL, LDLCALC, TRIG, CHOLHDL, LDLDIRECT in the last 72 hours. Thyroid Function Tests: Recent Labs    08/15/18 0232 08/16/18 0240  TSH 0.296*  --   FREET4  --  1.20   Anemia Panel: No results for input(s): VITAMINB12, FOLATE, FERRITIN, TIBC, IRON, RETICCTPCT in the last 72 hours. Urine analysis: No results found for: COLORURINE, APPEARANCEUR, LABSPEC, PHURINE, GLUCOSEU, HGBUR, BILIRUBINUR, KETONESUR, PROTEINUR, UROBILINOGEN, NITRITE, LEUKOCYTESUR Sepsis Labs: (procalcitonin:4,lacticidven:4)  ) Recent Results (from the past 240 hour(s))  SARS Coronavirus 2 (CEPHEID - Performed in [facility] hospital lab), Hosp Order     Status: None   Collection Time: 08/14/18 12:29 AM  Result Value Ref Range Status   SARS Coronavirus 2 NEGATIVE NEGATIVE Final    Comment: (NOTE) If result is NEGATIVE SARS-CoV-2 target nucleic acids are NOT DETECTED. The SARS-CoV-2 RNA is generally detectable in upper and lower  respiratory specimens during the acute phase of infection. The lowest  concentration of SARS-CoV-2 viral copies this assay can detect is 250  copies / mL. A negative result does not preclude SARS-CoV-2 infection  and should not be used as the sole basis for treatment or other  patient management decisions.  A negative result may occur with  improper specimen collection / handling, submission of specimen other  than nasopharyngeal swab, presence of viral mutation(s) within the  areas targeted by this assay, and inadequate number of viral copies  (<250 copies / mL). A negative result must be combined with clinical  observations, patient  history, and epidemiological information. If result is POSITIVE SARS-CoV-2 target nucleic acids are DETECTED. The SARS-CoV-2 RNA is generally detectable in upper and lower  respiratory specimens dur ing the acute phase of infection.  Positive  results are indicative of active infection with SARS-CoV-2.  Clinical  correlation with patient history and other diagnostic information is  necessary to determine patient infection status.  Positive results do  not rule out bacterial infection or co-infection with other viruses. If result is PRESUMPTIVE POSTIVE SARS-CoV-2 nucleic acids MAY BE PRESENT.   A presumptive positive result was obtained on the submitted specimen  and confirmed on repeat testing.  While 2019 novel coronavirus  (SARS-CoV-2) nucleic acids may be present in the submitted sample  additional confirmatory testing may be necessary for epidemiological  and / or clinical management purposes  to differentiate between  SARS-CoV-2 and other Sarbecovirus currently known to infect humans.  If clinically indicated additional testing with an alternate test  methodology [phone]) is advised. The SARS-CoV-2 RNA is generally  detectable in upper and lower respiratory sp ecimens during the acute  phase of infection. The expected result is Negative. Fact Sheet for Patients:  BoilerBrush.com.cy Fact Sheet for Healthcare Providers: https://pope.com/  This test is not yet approved or cleared by the [place] FDA and has been authorized for detection and/or diagnosis of SARS-CoV-2 by FDA under an Emergency Use [employer].  This EUA will remain in effect (meaning this test can be used) for the duration of the COVID-19 declaration under Section 564(b)(1) of the Act, 21 U.S.C. section 360bbb-3(b)(1), unless the authorization is terminated or revoked sooner. Performed at [facility], [address] [place], [place] [address]    Surgical PCR screen     Status: None   Collection Time: 08/14/18  5:48 AM  Result Value Ref Range Status   MRSA, PCR NEGATIVE NEGATIVE Final   Staphylococcus aureus NEGATIVE NEGATIVE Final    Comment: (NOTE) The Xpert SA Assay (FDA approved for NASAL specimens in patients 48 years of age and older), is one component of a comprehensive surveillance program. It is not intended to diagnose infection nor to guide or monitor treatment. Performed at [facility], [address] [place], [place] [address]          Radiology Studies: Dg Chest [facility] 1 View  Result Date: 08/14/2018 CLINICAL DATA:  Shortness of breath status post surgery EXAM: PORTABLE CHEST 1 VIEW COMPARISON:  08/13/2018 FINDINGS: The lung volumes are low. There is postoperative atelectasis at the lung bases. There is mild elevation of the left hemidiaphragm of unknown clinical significance. The patient is status post ORIF of the proximal left humerus. The alignment appears improved. There is no pneumothorax. No large pleural effusion. IMPRESSION: Low lung volumes with postoperative atelectasis. Mild elevation of the left hemidiaphragm. Electronically Signed   By: Katherine Mantle M.D.   On: 08/14/2018 19:56   Dg Shoulder Left  Result Date: 08/14/2018 CLINICAL DATA:  Left humerus fracture EXAM: LEFT SHOULDER - 2+ VIEW COMPARISON:  08/13/2018 FINDINGS: Three low resolution intraoperative spot views of the left shoulder. Total fluoroscopy time was 2 minutes 30 seconds. The images demonstrate surgical plate and multiple screw fixation of comminuted proximal humerus fracture with anatomic alignment. IMPRESSION: Intraoperative fluoroscopic assistance provided during surgical fixation of left humerus fracture Electronically Signed   By: Jasmine Pang M.D.   On: 08/14/2018 19:21   Dg Humerus Left  Result Date: 08/14/2018 CLINICAL DATA:  Humerus fracture EXAM: LEFT HUMERUS - 2+ VIEW COMPARISON:  08/13/2018 FINDINGS: Interval  surgical plate and multiple screw fixation of comminuted proximal humerus fracture with minimal residual displacement. Gas in the soft tissues consistent with recent operative status. Borderline widened appearance of the [facility] joint. IMPRESSION: Surgically fixated proximal humerus fracture with expected postoperative changes Electronically Signed   By: Jasmine Pang M.D.   On: 08/14/2018 20:16   Dg C-arm 1-60 Min  Result Date: 08/14/2018 CLINICAL DATA:  Intertrochanteric fracture EXAM: OPERATIVE left HIP (WITH PELVIS IF PERFORMED) 4 VIEWS TECHNIQUE: Fluoroscopic spot image(s) were submitted for interpretation post-operatively. COMPARISON:  08/13/2018 FINDINGS: Four low resolution intraoperative spot views of the left hip. Total fluoroscopy time was 2 minutes 30 seconds. The images demonstrate intramedullary rod and screw fixation of the left femur for nondisplaced intertrochanteric fracture. IMPRESSION: Intraoperative fluoroscopic assistance provided during surgical fixation of left femur fracture Electronically Signed   By: Jasmine Pang M.D.   On: 08/14/2018 19:20   Dg Hip Operative Unilat With Pelvis Left  Result Date: 08/14/2018 CLINICAL DATA:  Intertrochanteric fracture EXAM: OPERATIVE left HIP (WITH PELVIS IF PERFORMED) 4 VIEWS TECHNIQUE: Fluoroscopic spot image(s) were submitted for interpretation post-operatively. COMPARISON:  08/13/2018 FINDINGS: Four low  resolution intraoperative spot views of the left hip. Total fluoroscopy time was 2 minutes 30 seconds. The images demonstrate intramedullary rod and screw fixation of the left femur for nondisplaced intertrochanteric fracture. IMPRESSION: Intraoperative fluoroscopic assistance provided during surgical fixation of left femur fracture Electronically Signed   By: Jasmine PangKim  Fujinaga M.D.   On: 08/14/2018 19:20   Dg Hip Unilat W Or W/o Pelvis 2-3 Views Left  Result Date: 08/14/2018 CLINICAL DATA:  Postop hip fracture EXAM: DG HIP (WITH OR WITHOUT PELVIS)  2-3V LEFT COMPARISON:  08/13/2018 FINDINGS: Pubic symphysis and rami appear intact. Interval intramedullary rod and distal screw fixation of left intertrochanteric fracture. Gas in the soft tissues consistent with recent surgery. IMPRESSION: Interval surgical fixation of left intertrochanteric fracture with expected postoperative changes. Electronically Signed   By: Jasmine PangKim  Fujinaga M.D.   On: 08/14/2018 20:22        Scheduled Meds: . acetaminophen  650 mg Oral Q6H  . cholecalciferol  4,000 Units Oral Daily  . clopidogrel  75 mg Oral Daily  . enoxaparin (LOVENOX) injection  40 mg Subcutaneous Q24H  . feeding supplement (ENSURE ENLIVE)  237 mL Oral Q1400  . gabapentin  100 mg Oral BID  . multivitamin with minerals  1 tablet Oral Daily  . pantoprazole  40 mg Oral Q1200  . rosuvastatin  10 mg Oral q1800  . senna-docusate  1 tablet Oral BID   Continuous Infusions:    LOS: 2 days    Time spent: 25min    Zannie CovePreetha Parag Dorton, MD [facility] Page via [email or web address], password [email or web address] After 7PM please contact night-coverage  08/16/2018, 3:44 PM

## 2018-08-16 NOTE — TOC Initial Note (Addendum)
Transition of Care [facility]) - Initial/Assessment Note    Patient Details  Name: Cynthia Hobbs MRN: [id number] Date of Birth: 1955/12/09  Transition of Care [facility]) CM/SW Contact:    Kingsley Plan, RN Phone Number: 08/16/2018, 3:51 PM  Clinical Narrative:                 Confirmed face sheet information. Patient lives with her mother and grand daughter, and two daughters live close by.   Ordered wheel chair, 3 in1 and tub shower seat. DME already delivered to room.   PT reported rt lock on wheel chair is loose. Zack with [employer] aware and will change wheel chairs.  Sent benefits check for Apixaban 2.5mg  PO BID for 69month vs Lovenox 40mg  SQ daily. Waiting to hear back  Expected Discharge Plan: Home w Home Health Services Barriers to Discharge: Continued Medical Work up   Patient Goals and CMS Choice Patient states their goals for this hospitalization and ongoing recovery are:: to go home  CMS Medicare.gov Compare Post Acute Care list provided to:: Patient Choice offered to / list presented to : Patient  Expected Discharge Plan and Services Expected Discharge Plan: Home w Home Health Services   Discharge Planning Services: CM Consult Post Acute Care Choice: Home Health, Durable Medical Equipment Living arrangements for the past 2 months: Single Family Home                 DME Arranged: 3-N-1, Tub bench, Lightweight manual wheelchair with seat cushion DME Agency: [employer] Date DME Agency Contacted: 08/16/18 Time DME Agency Contacted: 1000 Representative spoke with at DME Agency: Zack HH Arranged: PT HH Agency: [facility] Date HH Agency Contacted: 08/16/18 Time HH Agency Contacted: 1000 Representative spoke with at [facility] Agency: DAn  Prior Living Arrangements/Services Living arrangements for the past 2 months: Single Family Home Lives with:: Parents Patient language and need for interpreter reviewed:: Yes Do you feel safe going back to the place where  you live?: Yes      Need for Family Participation in Patient Care: Yes (Comment) Care giver support system in place?: Yes (comment)   Criminal Activity/Legal Involvement Pertinent to Current Situation/Hospitalization: No - Comment as needed  Activities of Daily Living Home Assistive Devices/Equipment: None ADL Screening (condition at time of admission) Patient's cognitive ability adequate to safely complete daily activities?: Yes Is the patient deaf or have difficulty hearing?: No Does the patient have difficulty seeing, even when wearing glasses/contacts?: No Does the patient have difficulty concentrating, remembering, or making decisions?: No Patient able to express need for assistance with ADLs?: Yes Does the patient have difficulty dressing or bathing?: Yes Independently performs ADLs?: Yes (appropriate for developmental age) Does the patient have difficulty walking or climbing stairs?: Yes Weakness of Legs: Both Weakness of Arms/Hands: None  Permission Sought/Granted   Permission granted to share information with : Yes, Verbal Permission Granted     Permission granted to share info w AGENCY: [facility] and [facility]         Emotional Assessment Appearance:: Appears stated age Attitude/Demeanor/Rapport: Engaged Affect (typically observed): Accepting Orientation: : Oriented to Self, Oriented to Place, Oriented to  Time, Oriented to Situation   Psych Involvement: No (comment)  Admission diagnosis:  Fall, initial encounter [W19.XXXA] Closed intertrochanteric fracture, left, initial encounter [facility]) [S72.142A] Closed fracture of neck of left humerus, initial encounter [S42.212A] Closed nondisplaced fracture of left calcaneus, unspecified portion of calcaneus, initial encounter [S92.002A] Patient Active Problem List  Diagnosis Date Noted  . PAD (peripheral artery disease) (HCC) 08/14/2018  . Calcaneus fracture, left 08/14/2018  . Closed fracture of left proximal  humerus 08/14/2018  . Nondisplaced intertrochanteric fracture of left femur, initial encounter for closed fracture (HCC) 08/14/2018  . Coronary artery calcification seen on CAT scan 03/06/2018  . Hyperlipidemia 03/06/2018  . Tobacco abuse 03/06/2018  . Claudication in peripheral vascular disease (HCC) 03/06/2018  . Essential hypertension 03/06/2018   PCP:  Merri BrunettePharr, Walter, MD Pharmacy:   [facility] [address] - [place]), [place] - [address] [place] [address] Phone: [phone] Fax: [phone]  [facility] [place] [address] [place] [address] Phone: [phone] Fax: [phone]     Social Determinants of Health (SDOH) Interventions    Readmission Risk Interventions No flowsheet data found.

## 2018-08-16 NOTE — Care Management (Deleted)
    Durable Medical Equipment  (From admission, onward)         Start     Ordered   08/16/18 1013  For home use only DME lightweight manual wheelchair with seat cushion  Once    Comments:  Patient suffers from Left hip fracture, left humerus fracture and left ankle fracturewhich impairs their ability to perform daily activities like ambulating  in the home.  A cane will not resolve  issue with performing activities of daily living. A wheelchair will allow patient to safely perform daily activities. Patient is not able to propel themselves in the home using a standard weight wheelchair due to Left hip fracture, left humerus fracture and left ankle fracture. Patient can self propel in the lightweight wheelchair. Length of need 6 months . Accessories: elevating leg rests (ELRs), wheel locks, extensions and anti-tippers.  Seat and back cushion   08/16/18 1014   08/16/18 1012  For home use only DME Tub bench  Once    Comments:  Tub shower seat   Length of need 6 months   08/16/18 1014   08/16/18 1010  For home use only DME 3 n 1  Once    Comments:  Length of need 6 months   08/16/18 1014

## 2018-08-16 NOTE — Progress Notes (Signed)
Occupational Therapy Treatment Patient Details Name: Cynthia Hobbs MRN: [id number] DOB: [birth year] Today's Date: 08/16/2018    History of present illness Pt is a 63 y.o. female admitted 08/13/18 after falling off table, sustaining L intertrochanteric hip fx, L humerus fx, and non-displaced L calcaneal fx. S/p L femur nailing, L humerus ORIF, and closed tx of L calcaneus fx on 5/27. PMH includes PAD, GERD.   OT comments  Upon therapist arrival, pt requesting to use toilet. Pt completed toilet transfer with min assist and toileting hygiene with supervision. She completed 3 squat pivot transfers throughout session with min assist, ending with transfer to bed (pt requesting to return to bed). Continues to make good progress in prep for discharge home. Will continue to follow acutely.  Follow Up Recommendations  Home health OT;Supervision/Assistance - 24 hour    Equipment Recommendations  Tub/shower seat;Wheelchair (measurements OT);Wheelchair cushion (measurements OT);Other (comment)(drop arm BSC)    Recommendations for Other Services      Precautions / Restrictions Precautions Precautions: Fall Required Braces or Orthoses: Sling Restrictions Weight Bearing Restrictions: Yes LUE Weight Bearing: Non weight bearing LLE Weight Bearing: Non weight bearing       Mobility Bed Mobility Overal bed mobility: Needs Assistance Bed Mobility: Sit to Supine     Supine to sit: Min guard;HOB elevated Sit to supine: Min assist   General bed mobility comments: Assist to support left LE into bed.   Transfers Overall transfer level: Needs assistance   Transfers: Squat Pivot Transfers     Squat pivot transfers: Min assist     General transfer comment: Pt completed 3 squat pivot transfers (recliner<>BSC<>recliner<>bed) with min assist. VCs for right hand placement. Reminders of left UE NWB when scooting back in recliner.     Balance                                            ADL either performed or assessed with clinical judgement   ADL Overall ADL's : Needs assistance/impaired                     Toilet Transfer: Minimal assistance;Squat-pivot;BSC Toilet Transfer Details (indicate cue type and reason): simulated Toileting- Clothing Manipulation and Hygiene: Supervision/safety;Sitting/lateral lean         General ADL Comments: Pt requesting to use toilet upon therapist arrival. Completed transfer from recliner<>BSC<>recliner<>bed, min assist for each transfer.  Noted that pt's DME has been delivered to room.     Vision       Perception     Praxis      Cognition Arousal/Alertness: Awake/alert Behavior During Therapy: WFL for tasks assessed/performed Overall Cognitive Status: Within Functional Limits for tasks assessed                                          Exercises General Exercises - Upper Extremity Shoulder Flexion: AAROM;Left;10 reps;Seated Elbow Flexion: AROM;Left;10 reps;Seated Elbow Extension: AROM;Left;10 reps;Seated Wrist Flexion: AROM;Left;10 reps;Seated Wrist Extension: AROM;Left;10 reps;Seated Digit Composite Flexion: AROM;Left;10 reps;Seated Composite Extension: AROM;Left;10 reps;Seated   Shoulder Instructions Shoulder Instructions Donning/doffing sling/immobilizer: Moderate assistance     General Comments      Pertinent Vitals/ Pain       Pain Assessment: Faces Faces Pain Scale: Hurts little more Pain Location: LLE/LUE  Pain Descriptors / Indicators: Guarding;Discomfort Pain Intervention(s): Repositioned;Monitored during session  Home Living                                          Prior Functioning/Environment              Frequency  Min 3X/week        Progress Toward Goals  OT Goals(current goals can now be found in the care plan section)  Progress towards OT goals: Progressing toward goals  Acute Rehab OT Goals Patient Stated Goal: to go home to  grandbaby OT Goal Formulation: With patient Time For Goal Achievement: 08/29/18 Potential to Achieve Goals: Good ADL Goals Pt Will Perform Upper Body Bathing: sitting;with min assist(independent with directing caregiver) Pt Will Perform Lower Body Bathing: with min guard assist;sitting/lateral leans Pt Will Perform Upper Body Dressing: with min assist;sitting(independent with directing caregiver) Pt Will Perform Lower Body Dressing: with min assist;sitting/lateral leans(independent with directing caregiver) Pt Will Transfer to Toilet: with min guard assist;squat pivot transfer;bedside commode Pt Will Perform Toileting - Clothing Manipulation and hygiene: with min guard assist;sitting/lateral leans Pt Will Perform Tub/Shower Transfer: Shower transfer;with min guard assist;Squat pivot transfer;shower seat Additional ADL Goal #1: Pt will be able to independently complete left UE HEP.  Plan Discharge plan remains appropriate;Frequency remains appropriate    Co-evaluation                 AM-PAC OT "6 Clicks" Daily Activity     Outcome Measure   Help from another person eating meals?: A Little Help from another person taking care of personal grooming?: A Little Help from another person toileting, which includes using toliet, bedpan, or urinal?: A Little Help from another person bathing (including washing, rinsing, drying)?: A Lot Help from another person to put on and taking off regular upper body clothing?: A Lot Help from another person to put on and taking off regular lower body clothing?: A Lot 6 Click Score: 15    End of Session Equipment Utilized During Treatment: Gait belt  OT Visit Diagnosis: Unsteadiness on feet (R26.81);Muscle weakness (generalized) (M62.81);Other abnormalities of gait and mobility (R26.89);Pain Pain - Right/Left: Left Pain - part of body: Shoulder;Arm;Knee;Leg   Activity Tolerance Patient tolerated treatment well   Patient Left in bed;with call  bell/phone within reach;with bed alarm set   Nurse Communication Mobility status        Time: 2947-6546 OT Time Calculation (min): 18 min  Charges: OT General Charges $OT Visit: 1 Visit OT Treatments $Self Care/Home Management : 8-22 mins     Cipriano Mile OTR/L 08/16/2018, 1:07 PM

## 2018-08-16 NOTE — Care Management (Addendum)
    Durable Medical Equipment  (From admission, onward)         Start     Ordered   08/16/18 1019  For home use only DME 3 n 1  Once    Comments:  Length of need 6 months Drop arm 3n 1    08/16/18 1020   08/16/18 1013  For home use only DME lightweight manual wheelchair with seat cushion  Once    Comments:  Patient suffers from Left hip fracture, left humerus fracture and left ankle fracturewhich impairs their ability to perform daily activities like ambulating  in the home.  A cane will not resolve  issue with performing activities of daily living. A wheelchair will allow patient to safely perform daily activities. Patient is not able to propel themselves in the home using a standard weight wheelchair due to Left hip fracture, left humerus fracture and left ankle fracture. Patient can self propel in the lightweight wheelchair. Length of need 6 months . Accessories: elevating leg rests (ELRs), wheel locks, extensions and anti-tippers.  Seat and back cushion   08/16/18 1014   08/16/18 1012  For home use only DME Tub bench  Once    Comments:  Tub shower seat   Length of need 6 months   08/16/18 1014

## 2018-08-16 NOTE — Progress Notes (Signed)
Physical Therapy Treatment Patient Details Name: Cynthia Hobbs MRN: [id number] DOB: 1955/07/23 Today's Date: 08/16/2018    History of Present Illness Pt is a 63 y.o. female admitted 08/13/18 after falling off table, sustaining L intertrochanteric hip fx, L humerus fx, and non-displaced L calcaneal fx. S/p L femur nailing, L humerus ORIF, and closed tx of L calcaneus fx on 5/27. PMH includes PAD, GERD.    PT Comments    Session focused on visual demonstration of wheelchair management and parts including locking/unlocking, removing leg rests, folding up wheelchair, removing arm rests, anti tippers, and positioning for transfers. Noted right lock loose, contacted [employer] to notify for maintenance. Pt declining transfer out of bed, has practiced multiple times today with OT.     Follow Up Recommendations  Home health PT;Supervision/Assistance - 24 hour     Equipment Recommendations  Wheelchair (measurements PT);Other (comment)(tub shower seat, drop arm 3 in 1)    Recommendations for Other Services       Precautions / Restrictions Precautions Precautions: Fall Required Braces or Orthoses: Sling Restrictions Weight Bearing Restrictions: Yes LUE Weight Bearing: Non weight bearing LLE Weight Bearing: Non weight bearing    Mobility  Bed Mobility                  Transfers                    Ambulation/Gait                 Stairs             Wheelchair Mobility    Modified Rankin (Stroke Patients Only)       Balance                                            Cognition Arousal/Alertness: Awake/alert Behavior During Therapy: WFL for tasks assessed/performed Overall Cognitive Status: Within Functional Limits for tasks assessed                                        Exercises      General Comments        Pertinent Vitals/Pain Pain Assessment: Faces Faces Pain Scale: Hurts a little bit Pain Location:  left shoulder Pain Descriptors / Indicators: Guarding;Discomfort;Constant Pain Intervention(s): Monitored during session    Home Living                      Prior Function            PT Goals (current goals can now be found in the care plan section) Acute Rehab PT Goals Patient Stated Goal: to go home to grandbaby Potential to Achieve Goals: Good    Frequency    Min 5X/week      PT Plan Current plan remains appropriate    Co-evaluation              AM-PAC PT "6 Clicks" Mobility   Outcome Measure  Help needed turning from your back to your side while in a flat bed without using bedrails?: A Little Help needed moving from lying on your back to sitting on the side of a flat bed without using bedrails?: A Little Help needed moving to and from a bed to a  chair (including a wheelchair)?: A Lot Help needed standing up from a chair using your arms (e.g., wheelchair or bedside chair)?: A Lot Help needed to walk in hospital room?: Total Help needed climbing 3-5 steps with a railing? : Total 6 Click Score: 12    End of Session   Activity Tolerance: Patient tolerated treatment well Patient left: in bed;with call bell/phone within reach;with bed alarm set   PT Visit Diagnosis: Other abnormalities of gait and mobility (R26.89);Pain Pain - Right/Left: Left Pain - part of body: Shoulder;Arm;Leg;Ankle and joints of foot     Time: 1452-1526 PT Time Calculation (min) (ACUTE ONLY): 34 min  Charges:  $Self Care/Home Management: 23-37                     Laurina Bustlearoline Luismario Coston, PT, DPT Acute Rehabilitation Services Pager [phone] Office [phone]    Vanetta MuldersCarloine H Antanette Richwine 08/16/2018, 5:01 PM

## 2018-08-17 LAB — T3: T3, Total: 87 ng/dL (ref 71–180)

## 2018-08-17 LAB — PARATHYROID HORMONE, INTACT (NO CA): PTH: 18 pg/mL (ref 15–65)

## 2018-08-17 LAB — T3, FREE: T3, Free: 2.1 pg/mL (ref 2.0–4.4)

## 2018-08-17 MED ORDER — OXYCODONE-ACETAMINOPHEN 5-325 MG PO TABS: 1.0000 | ORAL_TABLET | Freq: Four times a day (QID) | ORAL | 0 refills | Status: DC | PRN

## 2018-08-17 MED ORDER — POLYETHYLENE GLYCOL 3350 17 G PO PACK: 17.0000 g | PACK | Freq: Every day | ORAL | 0 refills | Status: DC | PRN

## 2018-08-17 MED ORDER — APIXABAN 2.5 MG PO TABS: 2.5000 mg | ORAL_TABLET | Freq: Two times a day (BID) | ORAL | 0 refills | Status: DC

## 2018-08-17 MED ORDER — METHOCARBAMOL 500 MG PO TABS: 500.0000 mg | ORAL_TABLET | Freq: Four times a day (QID) | ORAL | 0 refills | Status: DC | PRN

## 2018-08-17 NOTE — Evaluation (Signed)
Occupational Therapy Evaluation Patient Details Name: Cynthia Hobbs MRN: [id number] DOB: January 21, 1956 Today's Date: 08/17/2018    History of Present Illness Pt is a 63 y.o. female admitted 08/13/18 after falling off table, sustaining L intertrochanteric hip fx, L humerus fx, and non-displaced L calcaneal fx. S/p L femur nailing, L humerus ORIF, and closed tx of L calcaneus fx on 5/27. PMH includes PAD, GERD.   Clinical Impression   Pt with difficulty generalizing NWB on L UE/LE during mobility and recalling education in L UE exercise and compensatory strategies for ADL taught in previous session. Reinforced all education and practice squat-pivot transfer to [facility] x 3. Continues to be appropriate for HHOT.    Follow Up Recommendations  Home health OT;Supervision/Assistance - 24 hour    Equipment Recommendations  Tub/shower seat;3 in 1 bedside commode;Wheelchair (measurements OT);Wheelchair cushion (measurements OT)    Recommendations for Other Services       Precautions / Restrictions Precautions Precautions: Fall Required Braces or Orthoses: Sling Restrictions Weight Bearing Restrictions: Yes LUE Weight Bearing: Non weight bearing LLE Weight Bearing: Non weight bearing      Mobility Bed Mobility Overal bed mobility: Modified Independent Bed Mobility: Supine to Sit;Sit to Supine     Supine to sit: Modified independent (Device/Increase time) Sit to supine: Modified independent (Device/Increase time)   General bed mobility comments: increased time, no assist  Transfers Overall transfer level: Needs assistance   Transfers: Squat Pivot Transfers     Squat pivot transfers: Min guard     General transfer comment: x 3 to and from [facility], pt with difficulty maintaining NWB on L LE    Balance Overall balance assessment: Needs assistance   Sitting balance-Leahy Scale: Good                                     ADL either performed or assessed with clinical  judgement   ADL Overall ADL's : Needs assistance/impaired     Grooming: Oral care;Sitting;Set up(total assist to put hair up)             Upper Body Dressing Details (indicate cue type and reason): reinforced compensatory strategies for dressing, recommended pt consider wearing dresses or gowns     Toilet Transfer: Min Systems developer Details (indicate cue type and reason): attempted lateral transfer to drop arm commode, unsuccessful so went to squat pivot technique Toileting- Clothing Manipulation and Hygiene: Set up;Sitting/lateral lean         General ADL Comments: instructed in how to use features of drop arm commode and reviewed w/c use without R footrest     Vision         Perception     Praxis      Pertinent Vitals/Pain Pain Assessment: Faces Faces Pain Scale: Hurts even more Pain Location: left shoulder Pain Descriptors / Indicators: Discomfort Pain Intervention(s): Monitored during session;RN gave pain meds during session;Repositioned     Hand Dominance     Extremity/Trunk Assessment Upper Extremity Assessment Upper Extremity Assessment: RUE deficits/detail LUE Deficits / Details: performed AAROM FF and ABD, AROM elbow to hand all x 10 in supine           Communication     Cognition Arousal/Alertness: Awake/alert Behavior During Therapy: Anxious Overall Cognitive Status: Impaired/Different from baseline Area of Impairment: Memory  Memory: Decreased recall of precautions             General Comments       Exercises     Shoulder Instructions      Home Living                                          Prior Functioning/Environment                   OT Problem List:        OT Treatment/Interventions:      OT Goals(Current goals can be found in the care plan section) Acute Rehab OT Goals Patient Stated Goal: to go home to grandbaby OT Goal Formulation:  With patient Time For Goal Achievement: 08/29/18 Potential to Achieve Goals: Good  OT Frequency: Min 3X/week   Barriers to D/C:            Co-evaluation              AM-PAC OT "6 Clicks" Daily Activity     Outcome Measure Help from another person eating meals?: A Little Help from another person taking care of personal grooming?: A Lot Help from another person toileting, which includes using toliet, bedpan, or urinal?: A Little Help from another person bathing (including washing, rinsing, drying)?: A Lot Help from another person to put on and taking off regular upper body clothing?: A Little Help from another person to put on and taking off regular lower body clothing?: A Lot 6 Click Score: 15   End of Session Equipment Utilized During Treatment: Gait belt  Activity Tolerance: Patient tolerated treatment well Patient left: in bed;with call bell/phone within reach;with bed alarm set  OT Visit Diagnosis: Unsteadiness on feet (R26.81);Muscle weakness (generalized) (M62.81);Other abnormalities of gait and mobility (R26.89);Pain                Time: [id number] OT Time Calculation (min): 44 min Charges:  OT General Charges $OT Visit: 1 Visit OT Treatments $Self Care/Home Management : 38-52 mins  Evern BioMayberry, Jazier Mcglamery Lynn 08/17/2018, 9:44 AM  Martie RoundJulie Quin Mcpherson, OTR/L Acute Rehabilitation Services Pager: [phone] Office: [phone]

## 2018-08-17 NOTE — Progress Notes (Signed)
Patient discharged to home with instructions and equipments. 

## 2018-08-17 NOTE — Progress Notes (Signed)
Subjective: 3 Days Post-Op Procedure(s) (LRB): INTRAMEDULLARY (IM) NAIL INTERTROCHANTRIC (Left) Open Reduction Internal Fixation (Orif) Proximal Humerus Fracture (Left) APPLICATION OF SPLINT LEFT LOWER LEG (Left) Patient reports pain as 3 on 0-10 scale.    Objective: Vital signs in last 24 hours: Temp:  [98.2 F (36.8 C)-98.7 F (37.1 C)] 98.5 F (36.9 C) (05/30 0443) Pulse Rate:  [79-101] 79 (05/30 0443) Resp:  [14-17] 14 (05/30 0443) BP: (94-118)/(61-72) 97/61 (05/30 0443) SpO2:  [95 %-98 %] 97 % (05/30 0443)  Intake/Output from previous day: 05/29 0701 - 05/30 0700 In: 660 [P.O.:660] Out: 700 [Urine:700] Intake/Output this shift: Total I/O In: 240 [P.O.:240] Out: 300 [Urine:300]  Recent Labs    08/15/18 0232 08/16/18 0240  HGB 10.6* 9.2*   Recent Labs    08/15/18 0232 08/16/18 0240  WBC 10.7* 10.9*  RBC 3.70* 3.31*  HCT 32.8* 29.6*  PLT 163 150   Recent Labs    08/15/18 0232 08/16/18 0240  NA 138 137  K 4.2 4.2  CL 103 103  CO2 27 27  BUN 15 23  CREATININE 0.77 0.74  GLUCOSE 151* 111*  CALCIUM 8.3* 8.4*   No results for input(s): LABPT, INR in the last 72 hours.  ABD soft Neurovascular intact Sensation intact distally Incision: no drainage   Assessment/Plan: 3 Days Post-Op Procedure(s) (LRB): INTRAMEDULLARY (IM) NAIL INTERTROCHANTRIC (Left) Open Reduction Internal Fixation (Orif) Proximal Humerus Fracture (Left) APPLICATION OF SPLINT LEFT LOWER LEG (Left) Advance diet Up with therapy Discharge home with home health   Patient progressing well   OK to discharge when stable medically   Cynthia Hobbs Cynthia Hobbs 08/17/2018, 1:36 PM

## 2018-08-18 NOTE — Discharge Summary (Signed)
Physician Discharge Summary  Cynthia Hobbs QIO:[id number] DOB: Mar 27, 1955 DOA: 08/13/2018  PCP: Merri Brunette, MD  Admit date: 08/13/2018 Discharge date: 08/17/2018  Time spent: 35 minutes  Recommendations for Outpatient Follow-up:  PCP Dr. Renne Crigler in 1 week Orthopedics Dr. Caryn Bee Haddix in 1 week Continue low-dose apixaban for DVT prophylaxis for 4 weeks  Discharge Diagnoses:  Principal Problem:   Nondisplaced intertrochanteric fracture of left femur, initial encounter for closed fracture [facility]) Left humerus fracture Left calcaneus fracture Active Problems:   Essential hypertension   PAD (peripheral artery disease) (HCC)   Calcaneus fracture, left   Closed fracture of left proximal humerus   Discharge Condition: stable  Diet recommendation: heart healthy  Filed Weights   08/14/18 0032  Weight: 68 kg    History of present illness:   63 year old female with history of longstanding tobacco abuse, PAD and right lower extremity status post angioplasty in 2/202 0 on Plavix was admitted earlier this morning following a mechanical fall off a kitchen table while dusting her chandelier. -In the emergency room she was noted to have a left hip intertrochanteric fracture, left humerus fracture and left ankle fracture  Hospital Course:  Left hip fracture, left humerus fracture and left ankle fracture -Following a mechanical fall -Orthopedics consulted, underwent IM nail of left femur, ORIF of L humerus and closed reduction of left calcaneus fracture 5/27 by Dr. Jena Gauss -Started physical therapy, patient declines rehab, plan for home with home health services -Discharged home on apixaban 2.5 mg twice daily for 4 weeks for DVT prophylaxis  -Follow-up with Dr. Jena Gauss in 1 to 2 weeks  Postop blood loss anemia -Mild drop in hemoglobin noted postop as expected  Essential hypertension -BP stable, not on meds  History of PAD/right lower extremity -Balloon angioplasty 04/2018 -Restarted  Plavix, continue statin  Tobacco abuse/suspected COPD -Counseled   Procedures: Procedures: Procedures: 5/27 1. CPT 27245-Cephalomedullary nailing of left intertrochanteric femur fracture 2. CPT 23615-Open reduction internal fixation of left proximal humerus fracture 3. CPT 28405-Closed treatment of left calcaneus fracture  Consultations:  Orthopedics Dr.Haddix  Discharge Exam: Vitals:   08/16/18 2107 08/17/18 0443  BP: 118/72 97/61  Pulse: (!) 101 79  Resp: 16 14  Temp: 98.7 F (37.1 C) 98.5 F (36.9 C)  SpO2: 95% 97%    General: AAOx3 Cardiovascular: S1S2/RRR Respiratory: CTAB  Discharge Instructions   Discharge Instructions    Diet - low sodium heart healthy   Complete by:  As directed      Allergies as of 08/17/2018   No Known Allergies     Medication List    STOP taking these medications   aspirin 81 MG EC tablet     TAKE these medications   amLODipine 5 MG tablet Commonly known as:  NORVASC Take 5 mg by mouth daily.   apixaban 2.5 MG Tabs tablet Commonly known as:  Eliquis Take 1 tablet (2.5 mg total) by mouth 2 (two) times daily for 28 days.   clopidogrel 75 MG tablet Commonly known as:  PLAVIX Take 1 tablet (75 mg total) by mouth daily with breakfast.   methocarbamol 500 MG tablet Commonly known as:  ROBAXIN Take 1 tablet (500 mg total) by mouth every 6 (six) hours as needed for muscle spasms.   oxyCODONE-acetaminophen 5-325 MG tablet Commonly known as:  PERCOCET/ROXICET Take 1-2 tablets by mouth every 6 (six) hours as needed for moderate pain or severe pain.   pantoprazole 40 MG tablet Commonly known as:  PROTONIX Take 1  tablet (40 mg total) by mouth daily.   polyethylene glycol 17 g packet Commonly known as:  MIRALAX / GLYCOLAX Take 17 g by mouth daily as needed for mild constipation.   rosuvastatin 10 MG tablet Commonly known as:  CRESTOR Take 10 mg by mouth daily.   Vitamin D3 50 MCG (2000 UT) Tabs Take 2,000 Units by  mouth daily.      No Known Allergies Follow-up Information    Haddix, Gillie Manners, MD. Schedule an appointment as soon as possible for a visit in 2 week(s).   Specialty:  Orthopedic Surgery Why:  For repeat x-rays of left hip, left calcaneus, left humerus Contact information: [address] [place] [address] [phone]            The results of significant diagnostics from this hospitalization (including imaging, microbiology, ancillary and laboratory) are listed below for reference.    Significant Diagnostic Studies: Dg Ankle Complete Left  Result Date: 08/13/2018 CLINICAL DATA:  Recent fall with left ankle pain, initial encounter EXAM: LEFT ANKLE COMPLETE - 3+ VIEW COMPARISON:  None. FINDINGS: Soft tissue swelling is noted primarily medially. Some increased density is noted in the midportion of the calcaneus suspicious for an impacted fracture. Calcaneal spurring is noted. No other definitive fracture is seen. IMPRESSION: Changes suspicious for calcaneal fracture. CT may be helpful for further evaluation as clinically indicated. Electronically Signed   By: Alcide Clever M.D.   On: 08/13/2018 23:16   Ct Foot Left Wo Contrast  Result Date: 08/14/2018 CLINICAL DATA:  Changes suggestive of calcaneal fracture on recent plain film EXAM: CT OF THE LEFT FOOT WITHOUT CONTRAST TECHNIQUE: Multidetector CT imaging of the left foot was performed according to the standard protocol. Multiplanar CT image reconstructions were also generated. COMPARISON:  Plain film examination obtained earlier in the same day. FINDINGS: Bones/Joint/Cartilage Comminuted fracture of the calcaneus is noted with impaction at the fracture site. This accounts for the increased density within the calcaneus seen on the recent exam. The talus appears within normal limits. Distal tibia and distal fibula are unremarkable. The remainder of the bony structures of the foot are within normal limits. Ligaments Suboptimally  assessed by CT. Muscles and Tendons Surrounding musculature is within normal limits. Soft tissues Mild soft tissue swelling is noted related to the recent injury. IMPRESSION: Comminuted fracture of the left calcaneus with extension into the articular surface with the talus as well as significant impaction at the fracture site. Electronically Signed   By: Alcide Clever M.D.   On: 08/14/2018 00:30   Dg Chest Port 1 View  Result Date: 08/14/2018 CLINICAL DATA:  Shortness of breath status post surgery EXAM: PORTABLE CHEST 1 VIEW COMPARISON:  08/13/2018 FINDINGS: The lung volumes are low. There is postoperative atelectasis at the lung bases. There is mild elevation of the left hemidiaphragm of unknown clinical significance. The patient is status post ORIF of the proximal left humerus. The alignment appears improved. There is no pneumothorax. No large pleural effusion. IMPRESSION: Low lung volumes with postoperative atelectasis. Mild elevation of the left hemidiaphragm. Electronically Signed   By: Katherine Mantle M.D.   On: 08/14/2018 19:56   Dg Chest Port 1 View  Result Date: 08/14/2018 CLINICAL DATA:  Left shoulder and hip pain following fall, initial encounter EXAM: PORTABLE CHEST 1 VIEW COMPARISON:  02/06/2018 FINDINGS: Cardiac shadows within normal limits. Lungs are well aerated bilaterally. Comminuted left proximal humeral fracture is again noted. The underlying bony thorax is within normal limits.  IMPRESSION: No active disease. Electronically Signed   By: Alcide CleverMark  Lukens M.D.   On: 08/14/2018 00:04   Dg Shoulder Left  Result Date: 08/14/2018 CLINICAL DATA:  Left humerus fracture EXAM: LEFT SHOULDER - 2+ VIEW COMPARISON:  08/13/2018 FINDINGS: Three low resolution intraoperative spot views of the left shoulder. Total fluoroscopy time was 2 minutes 30 seconds. The images demonstrate surgical plate and multiple screw fixation of comminuted proximal humerus fracture with anatomic alignment. IMPRESSION:  Intraoperative fluoroscopic assistance provided during surgical fixation of left humerus fracture Electronically Signed   By: Jasmine PangKim  Fujinaga M.D.   On: 08/14/2018 19:21   Dg Shoulder Left  Result Date: 08/13/2018 CLINICAL DATA:  Recent fall with left shoulder pain EXAM: LEFT SHOULDER - 2+ VIEW COMPARISON:  None. FINDINGS: Comminuted fracture of the proximal left humerus is seen with impaction at the fracture site. Fracture site is primarily at the surgical neck. The underlying bony thorax is within normal limits. Degenerative changes of the acromioclavicular joint are seen. IMPRESSION: Comminuted proximal left humeral fracture. Electronically Signed   By: Alcide CleverMark  Lukens M.D.   On: 08/13/2018 23:17   Dg Humerus Left  Result Date: 08/14/2018 CLINICAL DATA:  Humerus fracture EXAM: LEFT HUMERUS - 2+ VIEW COMPARISON:  08/13/2018 FINDINGS: Interval surgical plate and multiple screw fixation of comminuted proximal humerus fracture with minimal residual displacement. Gas in the soft tissues consistent with recent operative status. Borderline widened appearance of the [facility]. IMPRESSION: Surgically fixated proximal humerus fracture with expected postoperative changes Electronically Signed   By: Jasmine PangKim  Fujinaga M.D.   On: 08/14/2018 20:16   Dg Humerus Left  Result Date: 08/13/2018 CLINICAL DATA:  Recent fall with left shoulder pain, initial encounter EXAM: LEFT HUMERUS - 2+ VIEW COMPARISON:  None. FINDINGS: Comminuted proximal left humeral fracture is noted with impaction at the fracture site. The fracture predominantly involves the surgical neck. The underlying bony thorax is within normal limits. Degenerative changes of the acromioclavicular joint are noted. IMPRESSION: Comminuted proximal left humeral fracture. Electronically Signed   By: Alcide CleverMark  Lukens M.D.   On: 08/13/2018 23:12   Dg C-arm 1-60 Min  Result Date: 08/14/2018 CLINICAL DATA:  Intertrochanteric fracture EXAM: OPERATIVE left HIP (WITH PELVIS IF  PERFORMED) 4 VIEWS TECHNIQUE: Fluoroscopic spot image(s) were submitted for interpretation post-operatively. COMPARISON:  08/13/2018 FINDINGS: Four low resolution intraoperative spot views of the left hip. Total fluoroscopy time was 2 minutes 30 seconds. The images demonstrate intramedullary rod and screw fixation of the left femur for nondisplaced intertrochanteric fracture. IMPRESSION: Intraoperative fluoroscopic assistance provided during surgical fixation of left femur fracture Electronically Signed   By: Jasmine PangKim  Fujinaga M.D.   On: 08/14/2018 19:20   Dg Hip Operative Unilat With Pelvis Left  Result Date: 08/14/2018 CLINICAL DATA:  Intertrochanteric fracture EXAM: OPERATIVE left HIP (WITH PELVIS IF PERFORMED) 4 VIEWS TECHNIQUE: Fluoroscopic spot image(s) were submitted for interpretation post-operatively. COMPARISON:  08/13/2018 FINDINGS: Four low resolution intraoperative spot views of the left hip. Total fluoroscopy time was 2 minutes 30 seconds. The images demonstrate intramedullary rod and screw fixation of the left femur for nondisplaced intertrochanteric fracture. IMPRESSION: Intraoperative fluoroscopic assistance provided during surgical fixation of left femur fracture Electronically Signed   By: Jasmine PangKim  Fujinaga M.D.   On: 08/14/2018 19:20   Dg Hip Unilat W Or W/o Pelvis 2-3 Views Left  Result Date: 08/14/2018 CLINICAL DATA:  Postop hip fracture EXAM: DG HIP (WITH OR WITHOUT PELVIS) 2-3V LEFT COMPARISON:  08/13/2018 FINDINGS: Pubic symphysis and rami appear intact.  Interval intramedullary rod and distal screw fixation of left intertrochanteric fracture. Gas in the soft tissues consistent with recent surgery. IMPRESSION: Interval surgical fixation of left intertrochanteric fracture with expected postoperative changes. Electronically Signed   By: Jasmine Pang M.D.   On: 08/14/2018 20:22   Dg Hip Unilat W Or Wo Pelvis 2-3 Views Left  Result Date: 08/13/2018 CLINICAL DATA:  Recent fall with pelvic  pain, initial encounter EXAM: DG HIP (WITH OR WITHOUT PELVIS) 2-3V LEFT COMPARISON:  None. FINDINGS: Pelvic ring is intact. Undisplaced intratrochanteric left femoral fracture is seen. No soft tissue abnormality is noted. IMPRESSION: Undisplaced left intratrochanteric femoral fracture. Electronically Signed   By: Alcide Clever M.D.   On: 08/13/2018 23:15    Microbiology: Recent Results (from the past 240 hour(s))  SARS Coronavirus 2 (CEPHEID - Performed in [facility] hospital lab), Hosp Order     Status: None   Collection Time: 08/14/18 12:29 AM  Result Value Ref Range Status   SARS Coronavirus 2 NEGATIVE NEGATIVE Final    Comment: (NOTE) If result is NEGATIVE SARS-CoV-2 target nucleic acids are NOT DETECTED. The SARS-CoV-2 RNA is generally detectable in upper and lower  respiratory specimens during the acute phase of infection. The lowest  concentration of SARS-CoV-2 viral copies this assay can detect is 250  copies / mL. A negative result does not preclude SARS-CoV-2 infection  and should not be used as the sole basis for treatment or other  patient management decisions.  A negative result may occur with  improper specimen collection / handling, submission of specimen other  than nasopharyngeal swab, presence of viral mutation(s) within the  areas targeted by this assay, and inadequate number of viral copies  (<250 copies / mL). A negative result must be combined with clinical  observations, patient history, and epidemiological information. If result is POSITIVE SARS-CoV-2 target nucleic acids are DETECTED. The SARS-CoV-2 RNA is generally detectable in upper and lower  respiratory specimens dur ing the acute phase of infection.  Positive  results are indicative of active infection with SARS-CoV-2.  Clinical  correlation with patient history and other diagnostic information is  necessary to determine patient infection status.  Positive results do  not rule out bacterial infection or  co-infection with other viruses. If result is PRESUMPTIVE POSTIVE SARS-CoV-2 nucleic acids MAY BE PRESENT.   A presumptive positive result was obtained on the submitted specimen  and confirmed on repeat testing.  While 2019 novel coronavirus  (SARS-CoV-2) nucleic acids may be present in the submitted sample  additional confirmatory testing may be necessary for epidemiological  and / or clinical management purposes  to differentiate between  SARS-CoV-2 and other Sarbecovirus currently known to infect humans.  If clinically indicated additional testing with an alternate test  methodology [phone]) is advised. The SARS-CoV-2 RNA is generally  detectable in upper and lower respiratory sp ecimens during the acute  phase of infection. The expected result is Negative. Fact Sheet for Patients:  BoilerBrush.com.cy Fact Sheet for Healthcare Providers: https://pope.com/ This test is not yet approved or cleared by the [place] FDA and has been authorized for detection and/or diagnosis of SARS-CoV-2 by FDA under an Emergency Use Authorization (EUA).  This EUA will remain in effect (meaning this test can be used) for the duration of the COVID-19 declaration under Section 564(b)(1) of the Act, 21 U.S.C. section 360bbb-3(b)(1), unless the authorization is terminated or revoked sooner. Performed at [facility], [address], [place], [place] [address]  Surgical PCR screen     Status: None   Collection Time: 08/14/18  5:48 AM  Result Value Ref Range Status   MRSA, PCR NEGATIVE NEGATIVE Final   Staphylococcus aureus NEGATIVE NEGATIVE Final    Comment: (NOTE) The Xpert SA Assay (FDA approved for NASAL specimens in patients 23 years of age and older), is one component of a comprehensive surveillance program. It is not intended to diagnose infection nor to guide or monitor treatment. Performed at [facility], [address],  [place], [place] [address]      Labs: Basic Metabolic Panel: Recent Labs  Lab 08/13/18 2233 08/15/18 0232 08/16/18 0240  NA 140 138 137  K 3.8 4.2 4.2  CL 109 103 103  CO2 GLUCOSE 121* 151* 111*  BUN CREATININE 0.80 0.77 0.74  CALCIUM 8.7* 8.3* 8.4*  MG  --  1.8  --   PHOS  --  2.9  --    Liver Function Tests: Recent Labs  Lab 08/15/18 0232  ALBUMIN 2.8*   No results for input(s): LIPASE, AMYLASE in the last 168 hours. No results for input(s): AMMONIA in the last 168 hours. CBC: Recent Labs  Lab 08/13/18 2233 08/15/18 0232 08/16/18 0240  WBC 11.9* 10.7* 10.9*  HGB 13.3 10.6* 9.2*  HCT 41.5 32.8* 29.6*  MCV 88.7 88.6 89.4  PLT 257 163 150   Cardiac Enzymes: No results for input(s): CKTOTAL, CKMB, CKMBINDEX, TROPONINI in the last 168 hours. BNP: BNP (last 3 results) No results for input(s): BNP in the last 8760 hours.  ProBNP (last 3 results) No results for input(s): PROBNP in the last 8760 hours.  CBG: No results for input(s): GLUCAP in the last 168 hours.     Signed:  Zannie Cove MD.  [facility] 08/18/2018, 1:04 PM

## 2018-08-19 ENCOUNTER — Encounter (HOSPITAL_COMMUNITY): Payer: Self-pay | Admitting: Student

## 2018-08-20 DIAGNOSIS — K219 Gastro-esophageal reflux disease without esophagitis: Secondary | ICD-10-CM | POA: Diagnosis not present

## 2018-08-20 DIAGNOSIS — W08XXXD Fall from other furniture, subsequent encounter: Secondary | ICD-10-CM | POA: Diagnosis not present

## 2018-08-20 DIAGNOSIS — S42292D Other displaced fracture of upper end of left humerus, subsequent encounter for fracture with routine healing: Secondary | ICD-10-CM | POA: Diagnosis not present

## 2018-08-20 DIAGNOSIS — Z7902 Long term (current) use of antithrombotics/antiplatelets: Secondary | ICD-10-CM | POA: Diagnosis not present

## 2018-08-20 DIAGNOSIS — Z7901 Long term (current) use of anticoagulants: Secondary | ICD-10-CM | POA: Diagnosis not present

## 2018-08-20 DIAGNOSIS — I10 Essential (primary) hypertension: Secondary | ICD-10-CM | POA: Diagnosis not present

## 2018-08-20 DIAGNOSIS — S72102D Unspecified trochanteric fracture of left femur, subsequent encounter for closed fracture with routine healing: Secondary | ICD-10-CM | POA: Diagnosis not present

## 2018-08-20 DIAGNOSIS — Z9862 Peripheral vascular angioplasty status: Secondary | ICD-10-CM | POA: Diagnosis not present

## 2018-08-20 DIAGNOSIS — S92062D Displaced intraarticular fracture of left calcaneus, subsequent encounter for fracture with routine healing: Secondary | ICD-10-CM | POA: Diagnosis not present

## 2018-08-20 DIAGNOSIS — F17210 Nicotine dependence, cigarettes, uncomplicated: Secondary | ICD-10-CM | POA: Diagnosis not present

## 2018-08-20 DIAGNOSIS — M19012 Primary osteoarthritis, left shoulder: Secondary | ICD-10-CM | POA: Diagnosis not present

## 2018-08-20 DIAGNOSIS — I739 Peripheral vascular disease, unspecified: Secondary | ICD-10-CM | POA: Diagnosis not present

## 2018-08-23 DIAGNOSIS — K219 Gastro-esophageal reflux disease without esophagitis: Secondary | ICD-10-CM | POA: Diagnosis not present

## 2018-08-23 DIAGNOSIS — Z9862 Peripheral vascular angioplasty status: Secondary | ICD-10-CM | POA: Diagnosis not present

## 2018-08-23 DIAGNOSIS — I739 Peripheral vascular disease, unspecified: Secondary | ICD-10-CM | POA: Diagnosis not present

## 2018-08-23 DIAGNOSIS — S42292D Other displaced fracture of upper end of left humerus, subsequent encounter for fracture with routine healing: Secondary | ICD-10-CM | POA: Diagnosis not present

## 2018-08-23 DIAGNOSIS — M19012 Primary osteoarthritis, left shoulder: Secondary | ICD-10-CM | POA: Diagnosis not present

## 2018-08-23 DIAGNOSIS — Z7901 Long term (current) use of anticoagulants: Secondary | ICD-10-CM | POA: Diagnosis not present

## 2018-08-23 DIAGNOSIS — S92062D Displaced intraarticular fracture of left calcaneus, subsequent encounter for fracture with routine healing: Secondary | ICD-10-CM | POA: Diagnosis not present

## 2018-08-23 DIAGNOSIS — F17210 Nicotine dependence, cigarettes, uncomplicated: Secondary | ICD-10-CM | POA: Diagnosis not present

## 2018-08-23 DIAGNOSIS — I10 Essential (primary) hypertension: Secondary | ICD-10-CM | POA: Diagnosis not present

## 2018-08-23 DIAGNOSIS — W08XXXD Fall from other furniture, subsequent encounter: Secondary | ICD-10-CM | POA: Diagnosis not present

## 2018-08-23 DIAGNOSIS — Z7902 Long term (current) use of antithrombotics/antiplatelets: Secondary | ICD-10-CM | POA: Diagnosis not present

## 2018-08-23 DIAGNOSIS — S72102D Unspecified trochanteric fracture of left femur, subsequent encounter for closed fracture with routine healing: Secondary | ICD-10-CM | POA: Diagnosis not present

## 2018-08-28 DIAGNOSIS — Z9862 Peripheral vascular angioplasty status: Secondary | ICD-10-CM | POA: Diagnosis not present

## 2018-08-28 DIAGNOSIS — S72102D Unspecified trochanteric fracture of left femur, subsequent encounter for closed fracture with routine healing: Secondary | ICD-10-CM | POA: Diagnosis not present

## 2018-08-28 DIAGNOSIS — F17210 Nicotine dependence, cigarettes, uncomplicated: Secondary | ICD-10-CM | POA: Diagnosis not present

## 2018-08-28 DIAGNOSIS — S92062D Displaced intraarticular fracture of left calcaneus, subsequent encounter for fracture with routine healing: Secondary | ICD-10-CM | POA: Diagnosis not present

## 2018-08-28 DIAGNOSIS — I10 Essential (primary) hypertension: Secondary | ICD-10-CM | POA: Diagnosis not present

## 2018-08-28 DIAGNOSIS — K219 Gastro-esophageal reflux disease without esophagitis: Secondary | ICD-10-CM | POA: Diagnosis not present

## 2018-08-28 DIAGNOSIS — W08XXXD Fall from other furniture, subsequent encounter: Secondary | ICD-10-CM | POA: Diagnosis not present

## 2018-08-28 DIAGNOSIS — M19012 Primary osteoarthritis, left shoulder: Secondary | ICD-10-CM | POA: Diagnosis not present

## 2018-08-28 DIAGNOSIS — S42292D Other displaced fracture of upper end of left humerus, subsequent encounter for fracture with routine healing: Secondary | ICD-10-CM | POA: Diagnosis not present

## 2018-08-28 DIAGNOSIS — Z7902 Long term (current) use of antithrombotics/antiplatelets: Secondary | ICD-10-CM | POA: Diagnosis not present

## 2018-08-28 DIAGNOSIS — I739 Peripheral vascular disease, unspecified: Secondary | ICD-10-CM | POA: Diagnosis not present

## 2018-08-28 DIAGNOSIS — Z7901 Long term (current) use of anticoagulants: Secondary | ICD-10-CM | POA: Diagnosis not present

## 2018-08-30 DIAGNOSIS — K219 Gastro-esophageal reflux disease without esophagitis: Secondary | ICD-10-CM | POA: Diagnosis not present

## 2018-08-30 DIAGNOSIS — W08XXXD Fall from other furniture, subsequent encounter: Secondary | ICD-10-CM | POA: Diagnosis not present

## 2018-08-30 DIAGNOSIS — Z7901 Long term (current) use of anticoagulants: Secondary | ICD-10-CM | POA: Diagnosis not present

## 2018-08-30 DIAGNOSIS — Z9862 Peripheral vascular angioplasty status: Secondary | ICD-10-CM | POA: Diagnosis not present

## 2018-08-30 DIAGNOSIS — F17210 Nicotine dependence, cigarettes, uncomplicated: Secondary | ICD-10-CM | POA: Diagnosis not present

## 2018-08-30 DIAGNOSIS — M19012 Primary osteoarthritis, left shoulder: Secondary | ICD-10-CM | POA: Diagnosis not present

## 2018-08-30 DIAGNOSIS — S42292D Other displaced fracture of upper end of left humerus, subsequent encounter for fracture with routine healing: Secondary | ICD-10-CM | POA: Diagnosis not present

## 2018-08-30 DIAGNOSIS — S92062D Displaced intraarticular fracture of left calcaneus, subsequent encounter for fracture with routine healing: Secondary | ICD-10-CM | POA: Diagnosis not present

## 2018-08-30 DIAGNOSIS — I10 Essential (primary) hypertension: Secondary | ICD-10-CM | POA: Diagnosis not present

## 2018-08-30 DIAGNOSIS — Z7902 Long term (current) use of antithrombotics/antiplatelets: Secondary | ICD-10-CM | POA: Diagnosis not present

## 2018-08-30 DIAGNOSIS — I739 Peripheral vascular disease, unspecified: Secondary | ICD-10-CM | POA: Diagnosis not present

## 2018-08-30 DIAGNOSIS — S72102D Unspecified trochanteric fracture of left femur, subsequent encounter for closed fracture with routine healing: Secondary | ICD-10-CM | POA: Diagnosis not present

## 2018-09-02 DIAGNOSIS — Z9862 Peripheral vascular angioplasty status: Secondary | ICD-10-CM | POA: Diagnosis not present

## 2018-09-02 DIAGNOSIS — Z7901 Long term (current) use of anticoagulants: Secondary | ICD-10-CM | POA: Diagnosis not present

## 2018-09-02 DIAGNOSIS — I739 Peripheral vascular disease, unspecified: Secondary | ICD-10-CM | POA: Diagnosis not present

## 2018-09-02 DIAGNOSIS — F17210 Nicotine dependence, cigarettes, uncomplicated: Secondary | ICD-10-CM | POA: Diagnosis not present

## 2018-09-02 DIAGNOSIS — I10 Essential (primary) hypertension: Secondary | ICD-10-CM | POA: Diagnosis not present

## 2018-09-02 DIAGNOSIS — S42292D Other displaced fracture of upper end of left humerus, subsequent encounter for fracture with routine healing: Secondary | ICD-10-CM | POA: Diagnosis not present

## 2018-09-02 DIAGNOSIS — M19012 Primary osteoarthritis, left shoulder: Secondary | ICD-10-CM | POA: Diagnosis not present

## 2018-09-02 DIAGNOSIS — S92062D Displaced intraarticular fracture of left calcaneus, subsequent encounter for fracture with routine healing: Secondary | ICD-10-CM | POA: Diagnosis not present

## 2018-09-02 DIAGNOSIS — W08XXXD Fall from other furniture, subsequent encounter: Secondary | ICD-10-CM | POA: Diagnosis not present

## 2018-09-02 DIAGNOSIS — S72102D Unspecified trochanteric fracture of left femur, subsequent encounter for closed fracture with routine healing: Secondary | ICD-10-CM | POA: Diagnosis not present

## 2018-09-02 DIAGNOSIS — K219 Gastro-esophageal reflux disease without esophagitis: Secondary | ICD-10-CM | POA: Diagnosis not present

## 2018-09-02 DIAGNOSIS — Z7902 Long term (current) use of antithrombotics/antiplatelets: Secondary | ICD-10-CM | POA: Diagnosis not present

## 2018-09-03 DIAGNOSIS — S72145D Nondisplaced intertrochanteric fracture of left femur, subsequent encounter for closed fracture with routine healing: Secondary | ICD-10-CM | POA: Diagnosis not present

## 2018-09-03 DIAGNOSIS — S92002D Unspecified fracture of left calcaneus, subsequent encounter for fracture with routine healing: Secondary | ICD-10-CM | POA: Diagnosis not present

## 2018-09-03 DIAGNOSIS — S42202D Unspecified fracture of upper end of left humerus, subsequent encounter for fracture with routine healing: Secondary | ICD-10-CM | POA: Diagnosis not present

## 2018-09-11 ENCOUNTER — Other Ambulatory Visit: Payer: Self-pay | Admitting: Cardiology

## 2018-09-11 DIAGNOSIS — W08XXXD Fall from other furniture, subsequent encounter: Secondary | ICD-10-CM | POA: Diagnosis not present

## 2018-09-11 DIAGNOSIS — S92062D Displaced intraarticular fracture of left calcaneus, subsequent encounter for fracture with routine healing: Secondary | ICD-10-CM | POA: Diagnosis not present

## 2018-09-11 DIAGNOSIS — Z9862 Peripheral vascular angioplasty status: Secondary | ICD-10-CM | POA: Diagnosis not present

## 2018-09-11 DIAGNOSIS — Z7901 Long term (current) use of anticoagulants: Secondary | ICD-10-CM | POA: Diagnosis not present

## 2018-09-11 DIAGNOSIS — S42292D Other displaced fracture of upper end of left humerus, subsequent encounter for fracture with routine healing: Secondary | ICD-10-CM | POA: Diagnosis not present

## 2018-09-11 DIAGNOSIS — I10 Essential (primary) hypertension: Secondary | ICD-10-CM | POA: Diagnosis not present

## 2018-09-11 DIAGNOSIS — M19012 Primary osteoarthritis, left shoulder: Secondary | ICD-10-CM | POA: Diagnosis not present

## 2018-09-11 DIAGNOSIS — F17210 Nicotine dependence, cigarettes, uncomplicated: Secondary | ICD-10-CM | POA: Diagnosis not present

## 2018-09-11 DIAGNOSIS — Z7902 Long term (current) use of antithrombotics/antiplatelets: Secondary | ICD-10-CM | POA: Diagnosis not present

## 2018-09-11 DIAGNOSIS — K219 Gastro-esophageal reflux disease without esophagitis: Secondary | ICD-10-CM | POA: Diagnosis not present

## 2018-09-11 DIAGNOSIS — I739 Peripheral vascular disease, unspecified: Secondary | ICD-10-CM | POA: Diagnosis not present

## 2018-09-11 DIAGNOSIS — S72102D Unspecified trochanteric fracture of left femur, subsequent encounter for closed fracture with routine healing: Secondary | ICD-10-CM | POA: Diagnosis not present

## 2018-09-16 DIAGNOSIS — S72145A Nondisplaced intertrochanteric fracture of left femur, initial encounter for closed fracture: Secondary | ICD-10-CM | POA: Diagnosis not present

## 2018-09-26 NOTE — Telephone Encounter (Signed)
Pt updated and voiced understanding.  

## 2018-10-01 DIAGNOSIS — S92002D Unspecified fracture of left calcaneus, subsequent encounter for fracture with routine healing: Secondary | ICD-10-CM | POA: Diagnosis not present

## 2018-10-01 DIAGNOSIS — S42202D Unspecified fracture of upper end of left humerus, subsequent encounter for fracture with routine healing: Secondary | ICD-10-CM | POA: Diagnosis not present

## 2018-10-01 DIAGNOSIS — S72145D Nondisplaced intertrochanteric fracture of left femur, subsequent encounter for closed fracture with routine healing: Secondary | ICD-10-CM | POA: Diagnosis not present

## 2018-10-16 DIAGNOSIS — S72145A Nondisplaced intertrochanteric fracture of left femur, initial encounter for closed fracture: Secondary | ICD-10-CM | POA: Diagnosis not present

## 2018-11-05 DIAGNOSIS — S42202D Unspecified fracture of upper end of left humerus, subsequent encounter for fracture with routine healing: Secondary | ICD-10-CM | POA: Diagnosis not present

## 2018-11-05 DIAGNOSIS — S92002D Unspecified fracture of left calcaneus, subsequent encounter for fracture with routine healing: Secondary | ICD-10-CM | POA: Diagnosis not present

## 2018-11-05 DIAGNOSIS — S72145D Nondisplaced intertrochanteric fracture of left femur, subsequent encounter for closed fracture with routine healing: Secondary | ICD-10-CM | POA: Diagnosis not present

## 2018-11-16 DIAGNOSIS — S72145A Nondisplaced intertrochanteric fracture of left femur, initial encounter for closed fracture: Secondary | ICD-10-CM | POA: Diagnosis not present

## 2018-11-27 ENCOUNTER — Inpatient Hospital Stay (HOSPITAL_COMMUNITY): Admission: RE | Admit: 2018-11-27 | Payer: BC Managed Care – PPO | Source: Ambulatory Visit

## 2018-12-17 DIAGNOSIS — S42202D Unspecified fracture of upper end of left humerus, subsequent encounter for fracture with routine healing: Secondary | ICD-10-CM | POA: Diagnosis not present

## 2018-12-17 DIAGNOSIS — S92002D Unspecified fracture of left calcaneus, subsequent encounter for fracture with routine healing: Secondary | ICD-10-CM | POA: Diagnosis not present

## 2018-12-17 DIAGNOSIS — S72145A Nondisplaced intertrochanteric fracture of left femur, initial encounter for closed fracture: Secondary | ICD-10-CM | POA: Diagnosis not present

## 2018-12-17 DIAGNOSIS — S72145D Nondisplaced intertrochanteric fracture of left femur, subsequent encounter for closed fracture with routine healing: Secondary | ICD-10-CM | POA: Diagnosis not present

## 2019-01-01 ENCOUNTER — Telehealth: Payer: Self-pay | Admitting: *Deleted

## 2019-01-01 NOTE — Telephone Encounter (Signed)
Cynthia Hobbs will call us back to schedule her office visit.

## 2019-01-16 DIAGNOSIS — S72145A Nondisplaced intertrochanteric fracture of left femur, initial encounter for closed fracture: Secondary | ICD-10-CM | POA: Diagnosis not present

## 2019-01-22 DIAGNOSIS — I10 Essential (primary) hypertension: Secondary | ICD-10-CM | POA: Diagnosis not present

## 2019-01-22 DIAGNOSIS — E559 Vitamin D deficiency, unspecified: Secondary | ICD-10-CM | POA: Diagnosis not present

## 2019-01-22 DIAGNOSIS — Z0000 Encounter for general adult medical examination without abnormal findings: Secondary | ICD-10-CM | POA: Diagnosis not present

## 2019-01-29 DIAGNOSIS — M79672 Pain in left foot: Secondary | ICD-10-CM | POA: Diagnosis not present

## 2019-01-29 DIAGNOSIS — I2510 Atherosclerotic heart disease of native coronary artery without angina pectoris: Secondary | ICD-10-CM | POA: Diagnosis not present

## 2019-01-29 DIAGNOSIS — Z0000 Encounter for general adult medical examination without abnormal findings: Secondary | ICD-10-CM | POA: Diagnosis not present

## 2019-01-29 DIAGNOSIS — Z23 Encounter for immunization: Secondary | ICD-10-CM | POA: Diagnosis not present

## 2019-01-29 DIAGNOSIS — Z720 Tobacco use: Secondary | ICD-10-CM | POA: Diagnosis not present

## 2019-02-05 ENCOUNTER — Inpatient Hospital Stay (HOSPITAL_COMMUNITY): Admission: RE | Admit: 2019-02-05 | Payer: BC Managed Care – PPO | Source: Ambulatory Visit

## 2019-02-12 DIAGNOSIS — F418 Other specified anxiety disorders: Secondary | ICD-10-CM | POA: Diagnosis not present

## 2019-02-16 DIAGNOSIS — S72145A Nondisplaced intertrochanteric fracture of left femur, initial encounter for closed fracture: Secondary | ICD-10-CM | POA: Diagnosis not present

## 2019-03-18 DIAGNOSIS — S72145A Nondisplaced intertrochanteric fracture of left femur, initial encounter for closed fracture: Secondary | ICD-10-CM | POA: Diagnosis not present

## 2019-04-18 DIAGNOSIS — S72145A Nondisplaced intertrochanteric fracture of left femur, initial encounter for closed fracture: Secondary | ICD-10-CM | POA: Diagnosis not present

## 2019-06-11 ENCOUNTER — Encounter: Payer: Self-pay | Admitting: General Practice

## 2019-07-19 DIAGNOSIS — H1013 Acute atopic conjunctivitis, bilateral: Secondary | ICD-10-CM | POA: Diagnosis not present

## 2019-07-19 DIAGNOSIS — H40033 Anatomical narrow angle, bilateral: Secondary | ICD-10-CM | POA: Diagnosis not present

## 2019-07-23 ENCOUNTER — Other Ambulatory Visit: Payer: Self-pay | Admitting: Internal Medicine

## 2019-07-23 DIAGNOSIS — Z1231 Encounter for screening mammogram for malignant neoplasm of breast: Secondary | ICD-10-CM

## 2019-07-28 ENCOUNTER — Ambulatory Visit
Admission: RE | Admit: 2019-07-28 | Discharge: 2019-07-28 | Disposition: A | Discharge status: Home / Self Care | Payer: BC Managed Care – PPO | Source: Ambulatory Visit | Attending: Internal Medicine | Admitting: Internal Medicine

## 2019-07-28 ENCOUNTER — Other Ambulatory Visit: Payer: Self-pay

## 2019-07-28 DIAGNOSIS — Z1231 Encounter for screening mammogram for malignant neoplasm of breast: Secondary | ICD-10-CM | POA: Diagnosis not present

## 2019-10-18 ENCOUNTER — Encounter (HOSPITAL_BASED_OUTPATIENT_CLINIC_OR_DEPARTMENT_OTHER): Payer: Self-pay

## 2019-10-18 ENCOUNTER — Inpatient Hospital Stay (HOSPITAL_BASED_OUTPATIENT_CLINIC_OR_DEPARTMENT_OTHER)
Admission: EM | Admit: 2019-10-18 | Discharge: 2019-10-21 | DRG: 471 | Disposition: A | Discharge status: Home / Self Care | Payer: BC Managed Care – PPO | Attending: Neurosurgery | Admitting: Neurosurgery

## 2019-10-18 ENCOUNTER — Other Ambulatory Visit: Payer: Self-pay

## 2019-10-18 ENCOUNTER — Emergency Department (HOSPITAL_BASED_OUTPATIENT_CLINIC_OR_DEPARTMENT_OTHER): Payer: BC Managed Care – PPO

## 2019-10-18 DIAGNOSIS — I6782 Cerebral ischemia: Secondary | ICD-10-CM | POA: Diagnosis not present

## 2019-10-18 DIAGNOSIS — Z20822 Contact with and (suspected) exposure to covid-19: Secondary | ICD-10-CM | POA: Diagnosis not present

## 2019-10-18 DIAGNOSIS — M5124 Other intervertebral disc displacement, thoracic region: Secondary | ICD-10-CM | POA: Diagnosis not present

## 2019-10-18 DIAGNOSIS — I739 Peripheral vascular disease, unspecified: Secondary | ICD-10-CM | POA: Diagnosis present

## 2019-10-18 DIAGNOSIS — M50221 Other cervical disc displacement at C4-C5 level: Secondary | ICD-10-CM | POA: Diagnosis present

## 2019-10-18 DIAGNOSIS — I700 Atherosclerosis of aorta: Secondary | ICD-10-CM | POA: Diagnosis not present

## 2019-10-18 DIAGNOSIS — D1809 Hemangioma of other sites: Secondary | ICD-10-CM | POA: Diagnosis not present

## 2019-10-18 DIAGNOSIS — N281 Cyst of kidney, acquired: Secondary | ICD-10-CM | POA: Diagnosis not present

## 2019-10-18 DIAGNOSIS — Z7902 Long term (current) use of antithrombotics/antiplatelets: Secondary | ICD-10-CM

## 2019-10-18 DIAGNOSIS — Z8249 Family history of ischemic heart disease and other diseases of the circulatory system: Secondary | ICD-10-CM

## 2019-10-18 DIAGNOSIS — G959 Disease of spinal cord, unspecified: Secondary | ICD-10-CM | POA: Diagnosis present

## 2019-10-18 DIAGNOSIS — M542 Cervicalgia: Secondary | ICD-10-CM | POA: Diagnosis present

## 2019-10-18 DIAGNOSIS — M4802 Spinal stenosis, cervical region: Principal | ICD-10-CM | POA: Diagnosis present

## 2019-10-18 DIAGNOSIS — G9520 Unspecified cord compression: Secondary | ICD-10-CM | POA: Diagnosis not present

## 2019-10-18 DIAGNOSIS — Z86718 Personal history of other venous thrombosis and embolism: Secondary | ICD-10-CM

## 2019-10-18 DIAGNOSIS — I10 Essential (primary) hypertension: Secondary | ICD-10-CM | POA: Diagnosis not present

## 2019-10-18 DIAGNOSIS — Z419 Encounter for procedure for purposes other than remedying health state, unspecified: Secondary | ICD-10-CM

## 2019-10-18 DIAGNOSIS — M4712 Other spondylosis with myelopathy, cervical region: Secondary | ICD-10-CM | POA: Diagnosis not present

## 2019-10-18 DIAGNOSIS — G8250 Quadriplegia, unspecified: Secondary | ICD-10-CM | POA: Diagnosis not present

## 2019-10-18 DIAGNOSIS — I708 Atherosclerosis of other arteries: Secondary | ICD-10-CM | POA: Diagnosis not present

## 2019-10-18 DIAGNOSIS — K8020 Calculus of gallbladder without cholecystitis without obstruction: Secondary | ICD-10-CM | POA: Diagnosis not present

## 2019-10-18 DIAGNOSIS — E785 Hyperlipidemia, unspecified: Secondary | ICD-10-CM | POA: Diagnosis not present

## 2019-10-18 DIAGNOSIS — M5020 Other cervical disc displacement, unspecified cervical region: Secondary | ICD-10-CM

## 2019-10-18 DIAGNOSIS — Z7901 Long term (current) use of anticoagulants: Secondary | ICD-10-CM

## 2019-10-18 DIAGNOSIS — M4322 Fusion of spine, cervical region: Secondary | ICD-10-CM | POA: Diagnosis not present

## 2019-10-18 DIAGNOSIS — K219 Gastro-esophageal reflux disease without esophagitis: Secondary | ICD-10-CM | POA: Diagnosis present

## 2019-10-18 DIAGNOSIS — Z79899 Other long term (current) drug therapy: Secondary | ICD-10-CM | POA: Diagnosis not present

## 2019-10-18 DIAGNOSIS — M50021 Cervical disc disorder at C4-C5 level with myelopathy: Secondary | ICD-10-CM | POA: Diagnosis not present

## 2019-10-18 DIAGNOSIS — M47812 Spondylosis without myelopathy or radiculopathy, cervical region: Secondary | ICD-10-CM | POA: Diagnosis not present

## 2019-10-18 DIAGNOSIS — Z981 Arthrodesis status: Secondary | ICD-10-CM | POA: Diagnosis not present

## 2019-10-18 DIAGNOSIS — D68318 Other hemorrhagic disorder due to intrinsic circulating anticoagulants, antibodies, or inhibitors: Secondary | ICD-10-CM | POA: Diagnosis not present

## 2019-10-18 DIAGNOSIS — G992 Myelopathy in diseases classified elsewhere: Secondary | ICD-10-CM | POA: Diagnosis not present

## 2019-10-18 DIAGNOSIS — F17210 Nicotine dependence, cigarettes, uncomplicated: Secondary | ICD-10-CM | POA: Diagnosis present

## 2019-10-18 DIAGNOSIS — J432 Centrilobular emphysema: Secondary | ICD-10-CM | POA: Diagnosis not present

## 2019-10-18 DIAGNOSIS — R262 Difficulty in walking, not elsewhere classified: Secondary | ICD-10-CM | POA: Diagnosis not present

## 2019-10-18 DIAGNOSIS — R531 Weakness: Secondary | ICD-10-CM | POA: Diagnosis not present

## 2019-10-18 DIAGNOSIS — G9519 Other vascular myelopathies: Secondary | ICD-10-CM | POA: Diagnosis not present

## 2019-10-18 DIAGNOSIS — S22080A Wedge compression fracture of T11-T12 vertebra, initial encounter for closed fracture: Secondary | ICD-10-CM | POA: Diagnosis not present

## 2019-10-18 HISTORY — DX: Essential (primary) hypertension: I10

## 2019-10-18 MED ORDER — IBUPROFEN 400 MG PO TABS
400.0000 mg | ORAL_TABLET | Freq: Once | ORAL | Status: DC | PRN
  Filled 2019-10-18: qty 1

## 2019-10-18 MED ORDER — MORPHINE SULFATE (PF) 4 MG/ML IV SOLN
INTRAVENOUS | Status: AC
  Administered 2019-10-19: 4 mg via INTRAVENOUS
  Filled 2019-10-18: qty 1

## 2019-10-18 MED ORDER — MORPHINE SULFATE (PF) 4 MG/ML IV SOLN
4.0000 mg | Freq: Once | INTRAVENOUS | Status: AC
  Administered 2019-10-19: 4 mg via INTRAVENOUS

## 2019-10-18 NOTE — ED Notes (Signed)
Pt is a poor historian - unable to give a specific last known well. States she woke with aphasia and shortness of breath. Reports also woke with bilateral shoulder pain and states she cannot move them - abducting both arms at this time without difficulty. Progressively worsening shoulder pain throughout the day. States her feet bilaterally are heavy and she states she is unable to move them.  "I don't know if I can't or they are too heavy." States when she went to bed at 0200 in a reclining chair she was fine - LSW 0200.

## 2019-10-18 NOTE — ED Provider Notes (Signed)
[facility] EMERGENCY DEPARTMENT Provider Note   CSN: [id number] Arrival date & time: 10/18/19  2244   History Chief Complaint  Patient presents with   Weakness    Cynthia Hobbs is a 64 y.o. female.  The history is provided by the patient.  Weakness She has history of hypertension, hyperlipidemia, peripheral vascular disease and is anticoagulated on apixaban and comes in because of weakness since waking up this morning.  She was her normal self when she went to sleep at 2 AM.  When she woke up, she noted difficulty speaking and some shortness of breath.  She knew the words that she wanted to say, but could not get them to come out.  Speech has now returned to baseline but she still feels short of breath.  She states that she cannot use her arms or legs.  She has not been able to stand.  She has not urinated throughout the day.  She is complaining of pain in her upper back which she rates at 8/10.  She denies any recent falls or trauma.  She denies any headache.  She denies chest pain.  Of note, she has not urinated at all today.  Past Medical History:  Diagnosis Date   GERD (gastroesophageal reflux disease)    Hypertension    PAD (peripheral artery disease) (HCC)     Patient Active Problem List   Diagnosis Date Noted   PAD (peripheral artery disease) (HCC) 08/14/2018   Calcaneus fracture, left 08/14/2018   Closed fracture of left proximal humerus 08/14/2018   Nondisplaced intertrochanteric fracture of left femur, initial encounter for closed fracture (HCC) 08/14/2018   Coronary artery calcification seen on CAT scan 03/06/2018   Hyperlipidemia 03/06/2018   Tobacco abuse 03/06/2018   Claudication in peripheral vascular disease (HCC) 03/06/2018   Essential hypertension 03/06/2018    Past Surgical History:  Procedure Laterality Date   ABDOMINAL AORTOGRAM W/LOWER EXTREMITY Bilateral 04/29/2018   Procedure: ABDOMINAL AORTOGRAM W/LOWER EXTREMITY;  Surgeon:  Runell Gess, MD;  Location: [facility] INVASIVE CV LAB;  Service: Cardiovascular;  Laterality: Bilateral;   BACK SURGERY     BREAST EXCISIONAL BIOPSY Right    benign   CAST APPLICATION Left 08/14/2018   Procedure: APPLICATION OF SPLINT LEFT LOWER LEG;  Surgeon: Roby Lofts, MD;  Location: [facility] OR;  Service: Orthopedics;  Laterality: Left;   INTRAMEDULLARY (IM) NAIL INTERTROCHANTERIC Left 08/14/2018   Procedure: INTRAMEDULLARY (IM) NAIL INTERTROCHANTRIC;  Surgeon: Roby Lofts, MD;  Location: [facility] OR;  Service: Orthopedics;  Laterality: Left;   ORIF HUMERUS FRACTURE Left 08/14/2018   Procedure: Open Reduction Internal Fixation (Orif) Proximal Humerus Fracture;  Surgeon: Roby Lofts, MD;  Location: [facility] OR;  Service: Orthopedics;  Laterality: Left;   PERIPHERAL VASCULAR ATHERECTOMY Right 04/29/2018   Procedure: PERIPHERAL VASCULAR ATHERECTOMY;  Surgeon: Runell Gess, MD;  [facility]: [facility] INVASIVE CV LAB;  Service: Cardiovascular;  Laterality: Right;  SFA     OB History   No obstetric history on file.     Family History  Problem Relation Age of Onset   Hypertension Mother    Dementia Mother    Cancer Father     Social History   Tobacco Use   Smoking status: Current Every Day Smoker    Types: Cigarettes   Smokeless tobacco: Never Used  Vaping Use   Vaping Use: Never used  Substance Use Topics   Alcohol use: Yes    Comment: rarely   Drug use: Yes  Types: Marijuana    Comment: occ    Home Medications Prior to Admission medications   Medication Sig Start Date End Date Taking? Authorizing Provider  amLODipine (NORVASC) 5 MG tablet Take 5 mg by mouth daily.    [provider]  apixaban (ELIQUIS) 2.5 MG TABS tablet Take 1 tablet (2.5 mg total) by mouth 2 (two) times daily for 28 days. 08/17/18 09/14/18  Zannie Cove, MD  Cholecalciferol (VITAMIN D3) 50 MCG (2000 UT) TABS Take 2,000 Units by mouth daily.    [provider]  clopidogrel (PLAVIX)  75 MG tablet Take 1 tablet (75 mg total) by mouth daily with breakfast. 04/30/18   Filbert Schilder, NP  methocarbamol (ROBAXIN) 500 MG tablet Take 1 tablet (500 mg total) by mouth every 6 (six) hours as needed for muscle spasms. 08/17/18   Zannie Cove, MD  oxyCODONE-acetaminophen (PERCOCET/ROXICET) 5-325 MG tablet Take 1-2 tablets by mouth every 6 (six) hours as needed for moderate pain or severe pain. 08/17/18   Zannie Cove, MD  pantoprazole (PROTONIX) 40 MG tablet Take 1 tablet by mouth once daily 09/11/18   Runell Gess, MD  polyethylene glycol (MIRALAX / GLYCOLAX) 17 g packet Take 17 g by mouth daily as needed for mild constipation. 08/17/18   Zannie Cove, MD  rosuvastatin (CRESTOR) 10 MG tablet Take 10 mg by mouth daily.    [provider]    Allergies    Patient has no known allergies.  Review of Systems   Review of Systems  Neurological: Positive for weakness.  All other systems reviewed and are negative.   Physical Exam Updated Vital Signs BP (!) 136/90 (BP Location: Right Arm)    Pulse 70    Temp 99.3 F (37.4 C) (Oral)    Resp 20    Ht 5\' 2"  (1.575 m)    Wt 63.5 kg    SpO2 93%    BMI 25.61 kg/m   Physical Exam Vitals and nursing note reviewed.   64 year old female, resting comfortably and in no acute distress. Vital signs are normal. Oxygen saturation is 93%, which is normal. Head is normocephalic and atraumatic. PERRLA, EOMI. Oropharynx is clear. Neck is nontender and supple without adenopathy or JVD.  There are no carotid bruits. Back is nontender and there is no CVA tenderness. Lungs are clear without rales, wheezes, or rhonchi. Chest is nontender. Heart has regular rate and rhythm without murmur. Abdomen is soft, flat, nontender without masses or hepatosplenomegaly and peristalsis is normoactive. Extremities have no cyanosis or edema, full range of motion is present. Skin is warm and dry without rash. Neurologic: Awake and alert and oriented,  speech is normal, cranial nerves are intact.  There is marked weakness of both arms and both legs.  Arm strength is 3/5 and leg strength is 1-2/5.  Bilateral Babinski reflex is present.  ED Results / Procedures / Treatments   Labs (all labs ordered are listed, but only abnormal results are displayed) Labs Reviewed - No data to display  EKG None  Radiology CT Head Wo Contrast  Result Date: 10/19/2019 CLINICAL DATA:  Generalized weakness EXAM: CT HEAD WITHOUT CONTRAST TECHNIQUE: Contiguous axial images were obtained from the base of the skull through the vertex without intravenous contrast. COMPARISON:  None. FINDINGS: Brain: No evidence of acute territorial infarction, hemorrhage, hydrocephalus,extra-axial collection or mass lesion/mass effect. The ventricles are normal in size and contour. Low-attenuation changes in the deep white matter consistent with small vessel ischemia. Vascular:  No hyperdense vessel or unexpected calcification. Skull: The skull is intact. No fracture or focal lesion identified. Sinuses/Orbits: The visualized paranasal sinuses and mastoid air cells are clear. The orbits and globes intact. Other: None IMPRESSION: No acute intracranial abnormality. Findings consistent with mild chronic small vessel ischemia Electronically Signed   By: Jonna Clark M.D.   On: 10/19/2019 00:10    Procedures Procedures  CRITICAL CARE Performed by: Dione Booze Total critical care time: 50 minutes Critical care time was exclusive of separately billable procedures and treating other patients. Critical care was necessary to treat or prevent imminent or life-threatening deterioration. Critical care was time spent personally by me on the following activities: development of treatment plan with patient and/or surrogate as well as nursing, discussions with consultants, evaluation of patient's response to treatment, examination of patient, obtaining history from patient or surrogate, ordering and  performing treatments and interventions, ordering and review of laboratory studies, ordering and review of radiographic studies, pulse oximetry and re-evaluation of patient's condition.  Medications Ordered in ED Medications - No data to display  ED Course  I have reviewed the triage vital signs and the nursing notes.  Pertinent labs & imaging results that were available during my care of the patient were reviewed by me and considered in my medical decision making (see chart for details).  MDM Rules/Calculators/A&P Transient aphasia which may represent a transient ischemic attack.  Quadriparesis of uncertain cause.  She is anticoagulated and I am concerned about possible hemorrhage around her spinal cord.  Blood pressure in both arms is symmetric.  Femoral pulses are strong.  Will check screening labs and CT of head here, but will need to transfer to [facility] for MRIs of head and cervical and thoracic spine.  Old records are reviewed confirming history of peripheral vascular disease including angioplasty and stent in right superficial femoral artery.  CT of head is unremarkable.  CBC and coagulation studies are unremarkable.  I discussed the case with Dr. Lissa Hoard at [facility] emergency and will transfer her there for emergent MRI of head and cervical and thoracic spine.  Final Clinical Impression(s) / ED Diagnoses Final diagnoses:  None    Rx / DC Orders ED Discharge Orders    None       Dione Booze, MD 10/19/19 2242

## 2019-10-18 NOTE — ED Notes (Signed)
Pt's daughter Christen Bame went home - [phone]

## 2019-10-18 NOTE — ED Notes (Signed)
Patient transported to CT 

## 2019-10-18 NOTE — ED Triage Notes (Signed)
Pt woke up this AM with severe generalized weakness. Pt states she is unable to stand up, and unable to grip anything bilaterally. Pt also c/o pain between shoulder blades. Pt has very minimal effort noted to bilateral grip strength on assessment.

## 2019-10-19 ENCOUNTER — Inpatient Hospital Stay (HOSPITAL_COMMUNITY)
Admission: EM | Disposition: A | Discharge status: Home / Self Care | Payer: Self-pay | Source: Home / Self Care | Attending: Neurosurgery

## 2019-10-19 ENCOUNTER — Emergency Department (HOSPITAL_COMMUNITY): Payer: BC Managed Care – PPO | Admitting: Certified Registered Nurse Anesthetist

## 2019-10-19 ENCOUNTER — Emergency Department (HOSPITAL_COMMUNITY): Payer: BC Managed Care – PPO

## 2019-10-19 ENCOUNTER — Encounter (HOSPITAL_COMMUNITY): Payer: Self-pay | Admitting: Student

## 2019-10-19 ENCOUNTER — Emergency Department (HOSPITAL_BASED_OUTPATIENT_CLINIC_OR_DEPARTMENT_OTHER): Payer: BC Managed Care – PPO

## 2019-10-19 DIAGNOSIS — Z79899 Other long term (current) drug therapy: Secondary | ICD-10-CM | POA: Diagnosis not present

## 2019-10-19 DIAGNOSIS — Z86718 Personal history of other venous thrombosis and embolism: Secondary | ICD-10-CM | POA: Diagnosis not present

## 2019-10-19 DIAGNOSIS — G8250 Quadriplegia, unspecified: Secondary | ICD-10-CM | POA: Diagnosis present

## 2019-10-19 DIAGNOSIS — Z7901 Long term (current) use of anticoagulants: Secondary | ICD-10-CM | POA: Diagnosis not present

## 2019-10-19 DIAGNOSIS — Z7902 Long term (current) use of antithrombotics/antiplatelets: Secondary | ICD-10-CM | POA: Diagnosis not present

## 2019-10-19 DIAGNOSIS — F17210 Nicotine dependence, cigarettes, uncomplicated: Secondary | ICD-10-CM | POA: Diagnosis present

## 2019-10-19 DIAGNOSIS — R262 Difficulty in walking, not elsewhere classified: Secondary | ICD-10-CM | POA: Diagnosis present

## 2019-10-19 DIAGNOSIS — M542 Cervicalgia: Secondary | ICD-10-CM | POA: Diagnosis present

## 2019-10-19 DIAGNOSIS — K219 Gastro-esophageal reflux disease without esophagitis: Secondary | ICD-10-CM | POA: Diagnosis present

## 2019-10-19 DIAGNOSIS — M50021 Cervical disc disorder at C4-C5 level with myelopathy: Secondary | ICD-10-CM | POA: Diagnosis not present

## 2019-10-19 DIAGNOSIS — I10 Essential (primary) hypertension: Secondary | ICD-10-CM | POA: Diagnosis not present

## 2019-10-19 DIAGNOSIS — I739 Peripheral vascular disease, unspecified: Secondary | ICD-10-CM | POA: Diagnosis present

## 2019-10-19 DIAGNOSIS — E785 Hyperlipidemia, unspecified: Secondary | ICD-10-CM | POA: Diagnosis not present

## 2019-10-19 DIAGNOSIS — G959 Disease of spinal cord, unspecified: Secondary | ICD-10-CM | POA: Diagnosis present

## 2019-10-19 DIAGNOSIS — M4802 Spinal stenosis, cervical region: Secondary | ICD-10-CM | POA: Diagnosis not present

## 2019-10-19 DIAGNOSIS — Z20822 Contact with and (suspected) exposure to covid-19: Secondary | ICD-10-CM | POA: Diagnosis present

## 2019-10-19 DIAGNOSIS — Z8249 Family history of ischemic heart disease and other diseases of the circulatory system: Secondary | ICD-10-CM | POA: Diagnosis not present

## 2019-10-19 DIAGNOSIS — M4712 Other spondylosis with myelopathy, cervical region: Secondary | ICD-10-CM | POA: Diagnosis present

## 2019-10-19 DIAGNOSIS — M50221 Other cervical disc displacement at C4-C5 level: Secondary | ICD-10-CM | POA: Diagnosis present

## 2019-10-19 HISTORY — PX: ANTERIOR CERVICAL DECOMP/DISCECTOMY FUSION: SHX1161

## 2019-10-19 LAB — PROTIME-INR
INR: 1 (ref 0.8–1.2)
Prothrombin Time: 12.7 seconds (ref 11.4–15.2)

## 2019-10-19 LAB — URINALYSIS, MICROSCOPIC (REFLEX)

## 2019-10-19 LAB — URINALYSIS, ROUTINE W REFLEX MICROSCOPIC
Glucose, UA: NEGATIVE mg/dL
Hgb urine dipstick: NEGATIVE
Ketones, ur: NEGATIVE mg/dL
Leukocytes,Ua: NEGATIVE
Nitrite: NEGATIVE
Protein, ur: 30 mg/dL — AB
Specific Gravity, Urine: >1.03 — ABNORMAL HIGH (ref 1.005–1.030)
pH: 6 (ref 5.0–8.0)

## 2019-10-19 LAB — CBC WITH DIFFERENTIAL/PLATELET
Abs Immature Granulocytes: 0.03 10*3/uL (ref 0.00–0.07)
Basophils Absolute: 0 10*3/uL (ref 0.0–0.1)
Basophils Relative: 1 %
Eosinophils Absolute: 0 10*3/uL (ref 0.0–0.5)
Eosinophils Relative: 1 %
HCT: 45.9 % (ref 36.0–46.0)
Hemoglobin: 13.9 g/dL (ref 12.0–15.0)
Immature Granulocytes: 0 %
Lymphocytes Relative: 21 %
Lymphs Abs: 1.7 10*3/uL (ref 0.7–4.0)
MCH: 28.8 pg (ref 26.0–34.0)
MCHC: 30.3 g/dL (ref 30.0–36.0)
MCV: 95.2 fL (ref 80.0–100.0)
Monocytes Absolute: 0.6 10*3/uL (ref 0.1–1.0)
Monocytes Relative: 8 %
Neutro Abs: 5.5 10*3/uL (ref 1.7–7.7)
Neutrophils Relative %: 69 %
Platelets: 214 10*3/uL (ref 150–400)
RBC: 4.82 MIL/uL (ref 3.87–5.11)
RDW: 12.5 % (ref 11.5–15.5)
WBC: 7.9 10*3/uL (ref 4.0–10.5)
nRBC: 0 % (ref 0.0–0.2)

## 2019-10-19 LAB — COMPREHENSIVE METABOLIC PANEL
ALT: 18 U/L (ref 0–44)
AST: 28 U/L (ref 15–41)
Albumin: 3.7 g/dL (ref 3.5–5.0)
Alkaline Phosphatase: 61 U/L (ref 38–126)
Anion gap: 14 (ref 5–15)
BUN: 16 mg/dL (ref 8–23)
CO2: 28 mmol/L (ref 22–32)
Calcium: 10 mg/dL (ref 8.9–10.3)
Chloride: 97 mmol/L — ABNORMAL LOW (ref 98–111)
Creatinine, Ser: 0.67 mg/dL (ref 0.44–1.00)
GFR calc Af Amer: >60 mL/min (ref >60)
GFR calc non Af Amer: >60 mL/min (ref >60)
Glucose, Bld: 107 mg/dL — ABNORMAL HIGH (ref 70–99)
Potassium: 3.9 mmol/L (ref 3.5–5.1)
Sodium: 139 mmol/L (ref 135–145)
Total Bilirubin: 0.6 mg/dL (ref 0.3–1.2)
Total Protein: 7.4 g/dL (ref 6.5–8.1)

## 2019-10-19 LAB — RAPID URINE DRUG SCREEN, HOSP PERFORMED
Amphetamines: NOT DETECTED
Barbiturates: NOT DETECTED
Benzodiazepines: NOT DETECTED
Cocaine: NOT DETECTED
Opiates: POSITIVE — AB
Tetrahydrocannabinol: NOT DETECTED

## 2019-10-19 LAB — TYPE AND SCREEN
ABO/RH(D): A POS
Antibody Screen: NEGATIVE

## 2019-10-19 LAB — SURGICAL PCR SCREEN
MRSA, PCR: NEGATIVE
Staphylococcus aureus: NEGATIVE

## 2019-10-19 LAB — APTT: aPTT: 21 seconds — ABNORMAL LOW (ref 24–36)

## 2019-10-19 LAB — SARS CORONAVIRUS 2 BY RT PCR (HOSPITAL ORDER, PERFORMED IN ~~LOC~~ HOSPITAL LAB): SARS Coronavirus 2: NEGATIVE

## 2019-10-19 SURGERY — ANTERIOR CERVICAL DECOMPRESSION/DISCECTOMY FUSION 2 LEVEL/HARDWARE REMOVAL
Anesthesia: General | Site: Neck

## 2019-10-19 MED ORDER — CEFAZOLIN SODIUM-DEXTROSE 2-4 GM/100ML-% IV SOLN
2.0000 g | INTRAVENOUS | Status: AC
  Administered 2019-10-19: 2 g via INTRAVENOUS
  Filled 2019-10-19: qty 100

## 2019-10-19 MED ORDER — PHENOL 1.4 % MT LIQD: 1.0000 | OROMUCOSAL | Status: DC | PRN

## 2019-10-19 MED ORDER — ROSUVASTATIN CALCIUM 5 MG PO TABS
10.0000 mg | ORAL_TABLET | Freq: Every day | ORAL | Status: DC
  Administered 2019-10-19 – 2019-10-21 (×3): 10 mg via ORAL
  Filled 2019-10-19 (×3): qty 2

## 2019-10-19 MED ORDER — PROMETHAZINE HCL 25 MG/ML IJ SOLN: 6.2500 mg | INTRAMUSCULAR | Status: DC | PRN

## 2019-10-19 MED ORDER — MIDAZOLAM HCL 2 MG/2ML IJ SOLN
INTRAMUSCULAR | Status: AC
  Filled 2019-10-19: qty 2

## 2019-10-19 MED ORDER — LIDOCAINE 2% (20 MG/ML) 5 ML SYRINGE
INTRAMUSCULAR | Status: AC
  Filled 2019-10-19: qty 5

## 2019-10-19 MED ORDER — FENTANYL CITRATE (PF) 250 MCG/5ML IJ SOLN
INTRAMUSCULAR | Status: AC
  Filled 2019-10-19: qty 5

## 2019-10-19 MED ORDER — SODIUM CHLORIDE 0.9% FLUSH
3.0000 mL | INTRAVENOUS | Status: DC | PRN
  Administered 2019-10-19: 3 mL via INTRAVENOUS

## 2019-10-19 MED ORDER — AMLODIPINE BESYLATE 5 MG PO TABS
5.0000 mg | ORAL_TABLET | Freq: Every day | ORAL | Status: DC
  Administered 2019-10-19 – 2019-10-21 (×3): 5 mg via ORAL
  Filled 2019-10-19 (×3): qty 1

## 2019-10-19 MED ORDER — LACTATED RINGERS IV SOLN
INTRAVENOUS | Status: DC | PRN
Start: 2019-10-19 — End: 2019-10-19

## 2019-10-19 MED ORDER — OXYCODONE HCL 5 MG PO TABS: 5.0000 mg | ORAL_TABLET | Freq: Once | ORAL | Status: DC | PRN

## 2019-10-19 MED ORDER — DEXAMETHASONE SODIUM PHOSPHATE 10 MG/ML IJ SOLN
INTRAMUSCULAR | Status: AC
  Filled 2019-10-19: qty 1

## 2019-10-19 MED ORDER — THROMBIN 5000 UNITS EX SOLR
CUTANEOUS | Status: AC
  Filled 2019-10-19: qty 5000

## 2019-10-19 MED ORDER — FENTANYL CITRATE (PF) 100 MCG/2ML IJ SOLN
50.0000 ug | Freq: Once | INTRAMUSCULAR | Status: AC
  Administered 2019-10-19: 50 ug via INTRAVENOUS
  Filled 2019-10-19: qty 2

## 2019-10-19 MED ORDER — CHLORHEXIDINE GLUCONATE 0.12 % MT SOLN: 15.0000 mL | Freq: Once | OROMUCOSAL | Status: AC

## 2019-10-19 MED ORDER — THROMBIN 20000 UNITS EX SOLR
CUTANEOUS | Status: AC
  Filled 2019-10-19: qty 20000

## 2019-10-19 MED ORDER — DEXAMETHASONE SODIUM PHOSPHATE 10 MG/ML IJ SOLN
INTRAMUSCULAR | Status: DC | PRN
  Administered 2019-10-19: 10 mg via INTRAVENOUS

## 2019-10-19 MED ORDER — ONDANSETRON HCL 4 MG/2ML IJ SOLN: 4.0000 mg | Freq: Four times a day (QID) | INTRAMUSCULAR | Status: DC | PRN

## 2019-10-19 MED ORDER — MIDAZOLAM HCL 5 MG/5ML IJ SOLN
INTRAMUSCULAR | Status: DC | PRN
  Administered 2019-10-19: 2 mg via INTRAVENOUS

## 2019-10-19 MED ORDER — LIDOCAINE 5 % EX PTCH
1.0000 | MEDICATED_PATCH | CUTANEOUS | Status: DC
  Administered 2019-10-19: 1 via TRANSDERMAL
  Filled 2019-10-19: qty 1

## 2019-10-19 MED ORDER — FENTANYL CITRATE (PF) 100 MCG/2ML IJ SOLN
INTRAMUSCULAR | Status: DC | PRN
  Administered 2019-10-19: 150 ug via INTRAVENOUS
  Administered 2019-10-19 (×2): 50 ug via INTRAVENOUS

## 2019-10-19 MED ORDER — SODIUM CHLORIDE 0.9 % IV SOLN
250.0000 mL | INTRAVENOUS | Status: DC
  Administered 2019-10-19: 250 mL via INTRAVENOUS

## 2019-10-19 MED ORDER — LIDOCAINE 2% (20 MG/ML) 5 ML SYRINGE
INTRAMUSCULAR | Status: DC | PRN
  Administered 2019-10-19: 60 mg via INTRAVENOUS

## 2019-10-19 MED ORDER — SUGAMMADEX SODIUM 200 MG/2ML IV SOLN
INTRAVENOUS | Status: DC | PRN
Start: 2019-10-19 — End: 2019-10-19
  Administered 2019-10-19: 100 mg via INTRAVENOUS
  Administered 2019-10-19: 200 mg via INTRAVENOUS

## 2019-10-19 MED ORDER — THROMBIN 5000 UNITS EX SOLR
OROMUCOSAL | Status: DC | PRN
  Administered 2019-10-19 (×2): 5 mL via TOPICAL

## 2019-10-19 MED ORDER — PANTOPRAZOLE SODIUM 40 MG PO TBEC
40.0000 mg | DELAYED_RELEASE_TABLET | Freq: Every day | ORAL | Status: DC
  Administered 2019-10-19 – 2019-10-21 (×3): 40 mg via ORAL
  Filled 2019-10-19 (×3): qty 1

## 2019-10-19 MED ORDER — HYDROMORPHONE HCL 1 MG/ML IJ SOLN
0.2500 mg | INTRAMUSCULAR | Status: DC | PRN
  Administered 2019-10-19 (×4): 0.5 mg via INTRAVENOUS

## 2019-10-19 MED ORDER — VITAMIN D 25 MCG (1000 UNIT) PO TABS
2000.0000 [IU] | ORAL_TABLET | Freq: Every day | ORAL | Status: DC
  Administered 2019-10-19 – 2019-10-21 (×3): 2000 [IU] via ORAL
  Filled 2019-10-19 (×3): qty 2

## 2019-10-19 MED ORDER — CHLORHEXIDINE GLUCONATE 0.12 % MT SOLN
OROMUCOSAL | Status: AC
  Administered 2019-10-19: 15 mL via OROMUCOSAL
  Filled 2019-10-19: qty 15

## 2019-10-19 MED ORDER — FENTANYL CITRATE (PF) 100 MCG/2ML IJ SOLN
INTRAMUSCULAR | Status: AC
  Administered 2019-10-19: 50 ug via INTRAVENOUS
  Filled 2019-10-19: qty 2

## 2019-10-19 MED ORDER — ACETAMINOPHEN 10 MG/ML IV SOLN: 1000.0000 mg | Freq: Once | INTRAVENOUS | Status: DC | PRN

## 2019-10-19 MED ORDER — HYDROMORPHONE HCL 1 MG/ML IJ SOLN
INTRAMUSCULAR | Status: AC
  Filled 2019-10-19: qty 1

## 2019-10-19 MED ORDER — PHENYLEPHRINE HCL-NACL 10-0.9 MG/250ML-% IV SOLN
INTRAVENOUS | Status: DC | PRN
Start: 2019-10-19 — End: 2019-10-19
  Administered 2019-10-19: 20 ug/min via INTRAVENOUS

## 2019-10-19 MED ORDER — HYDROMORPHONE HCL 1 MG/ML IJ SOLN
1.0000 mg | INTRAMUSCULAR | Status: DC | PRN
  Administered 2019-10-19: 1 mg via INTRAVENOUS
  Filled 2019-10-19 (×2): qty 1

## 2019-10-19 MED ORDER — ONDANSETRON HCL 4 MG/2ML IJ SOLN
INTRAMUSCULAR | Status: DC | PRN
  Administered 2019-10-19: 4 mg via INTRAVENOUS

## 2019-10-19 MED ORDER — LACTATED RINGERS IV SOLN: INTRAVENOUS | Status: DC

## 2019-10-19 MED ORDER — VITAMIN D3 25 MCG PO TABS
2000.0000 [IU] | ORAL_TABLET | Freq: Every day | ORAL | Status: DC
  Filled 2019-10-19: qty 2

## 2019-10-19 MED ORDER — ONDANSETRON HCL 4 MG PO TABS: 4.0000 mg | ORAL_TABLET | Freq: Four times a day (QID) | ORAL | Status: DC | PRN

## 2019-10-19 MED ORDER — SODIUM CHLORIDE 0.9% IV SOLUTION: Freq: Once | INTRAVENOUS | Status: AC

## 2019-10-19 MED ORDER — 0.9 % SODIUM CHLORIDE (POUR BTL) OPTIME
TOPICAL | Status: DC | PRN
  Administered 2019-10-19: 1000 mL

## 2019-10-19 MED ORDER — ROCURONIUM BROMIDE 10 MG/ML (PF) SYRINGE
PREFILLED_SYRINGE | INTRAVENOUS | Status: DC | PRN
  Administered 2019-10-19: 60 mg via INTRAVENOUS
  Administered 2019-10-19: 10 mg via INTRAVENOUS

## 2019-10-19 MED ORDER — MORPHINE SULFATE (PF) 4 MG/ML IV SOLN
4.0000 mg | Freq: Once | INTRAVENOUS | Status: AC
  Filled 2019-10-19: qty 1

## 2019-10-19 MED ORDER — MENTHOL 3 MG MT LOZG: 1.0000 | LOZENGE | OROMUCOSAL | Status: DC | PRN

## 2019-10-19 MED ORDER — LACTATED RINGERS IV SOLN: INTRAVENOUS | Status: DC | PRN

## 2019-10-19 MED ORDER — SODIUM CHLORIDE 0.9% FLUSH
3.0000 mL | Freq: Two times a day (BID) | INTRAVENOUS | Status: DC
  Administered 2019-10-19 – 2019-10-21 (×4): 3 mL via INTRAVENOUS

## 2019-10-19 MED ORDER — ONDANSETRON HCL 4 MG/2ML IJ SOLN
INTRAMUSCULAR | Status: AC
  Filled 2019-10-19: qty 2

## 2019-10-19 MED ORDER — THROMBIN 20000 UNITS EX SOLR
CUTANEOUS | Status: DC | PRN
  Administered 2019-10-19: 20 mL via TOPICAL

## 2019-10-19 MED ORDER — ACETAMINOPHEN 325 MG PO TABS: 650.0000 mg | ORAL_TABLET | ORAL | Status: DC | PRN

## 2019-10-19 MED ORDER — IOHEXOL 350 MG/ML SOLN
100.0000 mL | Freq: Once | INTRAVENOUS | Status: AC | PRN
  Administered 2019-10-19: 100 mL via INTRAVENOUS

## 2019-10-19 MED ORDER — SODIUM CHLORIDE 0.9 % IV SOLN
INTRAVENOUS | Status: DC | PRN
  Administered 2019-10-19: 500 mL

## 2019-10-19 MED ORDER — HYDROCODONE-ACETAMINOPHEN 5-325 MG PO TABS
1.0000 | ORAL_TABLET | ORAL | Status: DC | PRN
  Administered 2019-10-20 – 2019-10-21 (×2): 1 via ORAL
  Filled 2019-10-19 (×2): qty 1

## 2019-10-19 MED ORDER — ORAL CARE MOUTH RINSE: 15.0000 mL | Freq: Once | OROMUCOSAL | Status: AC

## 2019-10-19 MED ORDER — METHOCARBAMOL 500 MG PO TABS
500.0000 mg | ORAL_TABLET | Freq: Four times a day (QID) | ORAL | Status: DC | PRN
  Administered 2019-10-20: 500 mg via ORAL
  Filled 2019-10-19: qty 1

## 2019-10-19 MED ORDER — POLYETHYLENE GLYCOL 3350 17 G PO PACK
17.0000 g | PACK | Freq: Every day | ORAL | Status: DC | PRN
  Administered 2019-10-21: 17 g via ORAL
  Filled 2019-10-19: qty 1

## 2019-10-19 MED ORDER — FENTANYL CITRATE (PF) 100 MCG/2ML IJ SOLN: 50.0000 ug | Freq: Once | INTRAMUSCULAR | Status: AC

## 2019-10-19 MED ORDER — PROPOFOL 10 MG/ML IV BOLUS
INTRAVENOUS | Status: AC
  Filled 2019-10-19: qty 20

## 2019-10-19 MED ORDER — PROPOFOL 10 MG/ML IV BOLUS
INTRAVENOUS | Status: DC | PRN
  Administered 2019-10-19: 150 mg via INTRAVENOUS

## 2019-10-19 MED ORDER — ROCURONIUM BROMIDE 10 MG/ML (PF) SYRINGE
PREFILLED_SYRINGE | INTRAVENOUS | Status: AC
  Filled 2019-10-19: qty 10

## 2019-10-19 MED ORDER — OXYCODONE HCL 5 MG/5ML PO SOLN: 5.0000 mg | Freq: Once | ORAL | Status: DC | PRN

## 2019-10-19 MED ORDER — ACETAMINOPHEN 650 MG RE SUPP: 650.0000 mg | RECTAL | Status: DC | PRN

## 2019-10-19 MED ORDER — HYDROCODONE-ACETAMINOPHEN 10-325 MG PO TABS
2.0000 | ORAL_TABLET | ORAL | Status: DC | PRN
  Administered 2019-10-20 – 2019-10-21 (×4): 2 via ORAL
  Filled 2019-10-19 (×5): qty 2

## 2019-10-19 MED ORDER — CEFAZOLIN SODIUM-DEXTROSE 1-4 GM/50ML-% IV SOLN
1.0000 g | Freq: Three times a day (TID) | INTRAVENOUS | Status: AC
  Administered 2019-10-19 (×2): 1 g via INTRAVENOUS
  Filled 2019-10-19 (×2): qty 50

## 2019-10-19 SURGICAL SUPPLY — 60 items
APL SKNCLS STERI-STRIP NONHPOA (GAUZE/BANDAGES/DRESSINGS) ×1
BAG DECANTER FOR FLEXI CONT (MISCELLANEOUS) ×2 IMPLANT
BAND INSRT 18 STRL LF DISP RB (MISCELLANEOUS) ×2
BAND RUBBER #18 3X1/16 STRL (MISCELLANEOUS) ×4 IMPLANT
BENZOIN TINCTURE PRP APPL 2/3 (GAUZE/BANDAGES/DRESSINGS) ×2 IMPLANT
BIT DRILL 13 (BIT) ×2 IMPLANT
BUR MATCHSTICK NEURO 3.0 LAGG (BURR) ×2 IMPLANT
CAGE PEEK 7X14X11 (Cage) ×4 IMPLANT
CANISTER SUCT 3000ML PPV (MISCELLANEOUS) ×2 IMPLANT
CARTRIDGE OIL MAESTRO DRILL (MISCELLANEOUS) ×1 IMPLANT
CLSR STERI-STRIP ANTIMIC 1/2X4 (GAUZE/BANDAGES/DRESSINGS) ×2 IMPLANT
COVER WAND RF STERILE (DRAPES) ×2 IMPLANT
DIFFUSER DRILL AIR PNEUMATIC (MISCELLANEOUS) ×2 IMPLANT
DRAPE C-ARM 42X72 X-RAY (DRAPES) ×4 IMPLANT
DRAPE LAPAROTOMY 100X72 PEDS (DRAPES) ×2 IMPLANT
DRAPE MICROSCOPE LEICA (MISCELLANEOUS) ×2 IMPLANT
DURAPREP 6ML APPLICATOR 50/CS (WOUND CARE) ×2 IMPLANT
ELECT COATED BLADE 2.86 ST (ELECTRODE) ×2 IMPLANT
ELECT REM PT RETURN 9FT ADLT (ELECTROSURGICAL) ×2
ELECTRODE REM PT RTRN 9FT ADLT (ELECTROSURGICAL) ×1 IMPLANT
GAUZE 4X4 16PLY RFD (DISPOSABLE) IMPLANT
GAUZE SPONGE 4X4 12PLY STRL (GAUZE/BANDAGES/DRESSINGS) ×2 IMPLANT
GLOVE BIO SURGEON STRL SZ 6.5 (GLOVE) ×2 IMPLANT
GLOVE BIO SURGEON STRL SZ7 (GLOVE) ×2 IMPLANT
GLOVE BIOGEL PI IND STRL 7.5 (GLOVE) ×1 IMPLANT
GLOVE BIOGEL PI INDICATOR 7.5 (GLOVE) ×1
GLOVE ECLIPSE 9.0 STRL (GLOVE) ×2 IMPLANT
GLOVE EXAM NITRILE XL STR (GLOVE) IMPLANT
GOWN STRL REUS W/ TWL LRG LVL3 (GOWN DISPOSABLE) IMPLANT
GOWN STRL REUS W/ TWL XL LVL3 (GOWN DISPOSABLE) IMPLANT
GOWN STRL REUS W/TWL 2XL LVL3 (GOWN DISPOSABLE) IMPLANT
GOWN STRL REUS W/TWL LRG LVL3 (GOWN DISPOSABLE)
GOWN STRL REUS W/TWL XL LVL3 (GOWN DISPOSABLE)
HALTER HD/CHIN CERV TRACTION D (MISCELLANEOUS) ×2 IMPLANT
HEMOSTAT POWDER KIT SURGIFOAM (HEMOSTASIS) IMPLANT
HEMOSTAT SURGICEL 2X14 (HEMOSTASIS) IMPLANT
KIT BASIN OR (CUSTOM PROCEDURE TRAY) ×2 IMPLANT
KIT TURNOVER KIT B (KITS) ×2 IMPLANT
NEEDLE SPNL 20GX3.5 QUINCKE YW (NEEDLE) ×2 IMPLANT
NS IRRIG 1000ML POUR BTL (IV SOLUTION) ×2 IMPLANT
OIL CARTRIDGE MAESTRO DRILL (MISCELLANEOUS) ×2
PACK LAMINECTOMY NEURO (CUSTOM PROCEDURE TRAY) ×2 IMPLANT
PAD ARMBOARD 7.5X6 YLW CONV (MISCELLANEOUS) ×6 IMPLANT
PLATE ELITE 42MM (Plate) ×2 IMPLANT
SCREW ST 13X4XST VA NS SPNE (Screw) ×6 IMPLANT
SCREW ST VAR 4 ATL (Screw) ×12 IMPLANT
SPACER SPNL 11X14X7XPEEK CVD (Cage) ×2 IMPLANT
SPCR SPNL 11X14X7XPEEK CVD (Cage) ×2 IMPLANT
SPONGE INTESTINAL PEANUT (DISPOSABLE) ×2 IMPLANT
SPONGE SURGIFOAM ABS GEL 100 (HEMOSTASIS) ×2 IMPLANT
SPONGE SURGIFOAM ABS GEL SZ50 (HEMOSTASIS) IMPLANT
STRIP CLOSURE SKIN 1/2X4 (GAUZE/BANDAGES/DRESSINGS) ×2 IMPLANT
SUT VIC AB 3-0 SH 8-18 (SUTURE) ×2 IMPLANT
SUT VIC AB 4-0 RB1 18 (SUTURE) ×2 IMPLANT
TAPE CLOTH 4X10 WHT NS (GAUZE/BANDAGES/DRESSINGS) ×2 IMPLANT
TAPE CLOTH SURG 4X10 WHT LF (GAUZE/BANDAGES/DRESSINGS) ×2 IMPLANT
TOWEL GREEN STERILE (TOWEL DISPOSABLE) ×2 IMPLANT
TOWEL GREEN STERILE FF (TOWEL DISPOSABLE) ×2 IMPLANT
TRAP SPECIMEN MUCUS 40CC (MISCELLANEOUS) ×4 IMPLANT
WATER STERILE IRR 1000ML POUR (IV SOLUTION) ×2 IMPLANT

## 2019-10-19 NOTE — ED Provider Notes (Signed)
02:40: Assumed care from Dr. Preston Fleeting in ED to ED transfer of patient for further evaluation with MRI.   Please see prior provider note for full H&P.   Briefly patient is a 64 year old female with a history of hypertension, hyperlipidemia, & PAD S/p peripheral vascular arthrectomy on Eliquis who presents to the ED with complaints of tingling and weakness to her bilateral upper and lower extremities that started at 9 AM.  Patient states that she went to bed feeling normal at 2 AM, she slept on a pullout couch, she woke up at 9 AM with trouble getting her words out, dyspnea, pain to the area between her shoulder blades, as well as tingling and weakness to her upper and lower extremities.  Her speech difficulty and dyspnea resolved within 1 hour, however her upper back pain which she describes as contractions type pain persisted as well as the neurologic symptoms to her upper and lower extremities.  Given that her speech problems resolved she decided to wait to come to the ER, however given that she still could not walk she decided to come in tonight.  She had not urinated all day, a Foley catheter was inserted- she state that urine came out but she does not know how much (no documentation found from prior ED in this regard).  She did not like she was retaining any urine prior to this insertion.  She is not had any fecal retention or incontinence that she is aware of.  She denies any IV drug use.  She states she occasionally smokes marijuana.  Physical Exam  BP (!) 153/69   Pulse 76   Temp 99.2 F (37.3 C) (Oral)   Resp 20   Ht 5\' 2"  (1.575 m)   Wt 63.5 kg   SpO2 94%   BMI 25.61 kg/m   Physical Exam Vitals and nursing note reviewed.  Constitutional:      General: She is not in acute distress. HENT:     Head: Normocephalic and atraumatic.  Eyes:     Extraocular Movements: Extraocular movements intact.     Pupils: Pupils are equal, round, and reactive to light.  Cardiovascular:     Rate and  Rhythm: Normal rate and regular rhythm.     Comments: She has 2+ symmetric radial and DP pulses bilaterally. Pulmonary:     Effort: Pulmonary effort is normal.     Breath sounds: Normal breath sounds.  Chest:     Chest wall: Tenderness (Posterior central chest wall in the mid thoracic region) present.  Abdominal:     General: There is no distension.     Tenderness: There is no abdominal tenderness. There is no guarding or rebound.  Genitourinary:    Comments: DRE performed, decreased rectal tone noted, unable to [place]. Foley catheter in place. Chaperone present.  Musculoskeletal:     Right lower leg: No edema.     Left lower leg: No edema.  Skin:    General: Skin is warm and dry.  Neurological:     Mental Status: She is alert.     Comments: Alert.  Clear speech.  CN III through XII grossly intact.  Patient reports decreased sensation to bilateral upper and lower extremities, however she is able to identify light touch x4.  She has weakness to the upper and lower extremities that seems symmetric, lower extremities more so than upper.  She is unable to give me good grip strength or strength with plantar dorsiflexion bilaterally.  She is unable to  hold her arms or legs up against gravity for any period of time. She can move the UEs some. Unable to perform finger-to-nose.     ED Course/Procedures     Results for orders placed or performed during the hospital encounter of 10/18/19  SARS Coronavirus 2 by RT PCR (hospital order, performed in [facility] hospital lab) Nasopharyngeal Nasopharyngeal Swab   Specimen: Nasopharyngeal Swab  Result Value Ref Range   SARS Coronavirus 2 NEGATIVE NEGATIVE  CBC with Differential  Result Value Ref Range   WBC 7.9 4.0 - 10.5 K/uL   RBC 4.82 3.87 - 5.11 MIL/uL   Hemoglobin 13.9 12.0 - 15.0 g/dL   HCT 56.2 36 - 46 %   MCV 95.2 80.0 - 100.0 fL   MCH 28.8 26.0 - 34.0 pg   MCHC 30.3 30.0 - 36.0 g/dL   RDW 13.0 86.5 - 78.4 %   Platelets 214 150 - 400  K/uL   nRBC 0.0 0.0 - 0.2 %   Neutrophils Relative % 69 %   Neutro Abs 5.5 1.7 - 7.7 K/uL   Lymphocytes Relative 21 %   Lymphs Abs 1.7 0.7 - 4.0 K/uL   Monocytes Relative 8 %   Monocytes Absolute 0.6 0 - 1 K/uL   Eosinophils Relative 1 %   Eosinophils Absolute 0.0 0 - 0 K/uL   Basophils Relative 1 %   Basophils Absolute 0.0 0 - 0 K/uL   Immature Granulocytes 0 %   Abs Immature Granulocytes 0.03 0.00 - 0.07 K/uL  Protime-INR  Result Value Ref Range   Prothrombin Time 12.7 11.4 - 15.2 seconds   INR 1.0 0.8 - 1.2  APTT  Result Value Ref Range   aPTT 21 (L) 24 - 36 seconds  Urinalysis, Routine w reflex microscopic  Result Value Ref Range   Color, Urine ORANGE (A) YELLOW   APPearance CLOUDY (A) CLEAR   Specific Gravity, Urine >1.030 (H) 1.005 - 1.030   pH 6.0 5.0 - 8.0   Glucose, UA NEGATIVE NEGATIVE mg/dL   Hgb urine dipstick NEGATIVE NEGATIVE   Bilirubin Urine SMALL (A) NEGATIVE   Ketones, ur NEGATIVE NEGATIVE mg/dL   Protein, ur 30 (A) NEGATIVE mg/dL   Nitrite NEGATIVE NEGATIVE   Leukocytes,Ua NEGATIVE NEGATIVE  Comprehensive metabolic panel  Result Value Ref Range   Sodium 139 135 - 145 mmol/L   Potassium 3.9 3.5 - 5.1 mmol/L   Chloride 97 (L) 98 - 111 mmol/L   CO2 28 22 - 32 mmol/L   Glucose, Bld 107 (H) 70 - 99 mg/dL   BUN 16 8 - 23 mg/dL   Creatinine, Ser 6.96 0.44 - 1.00 mg/dL   Calcium 29.5 8.9 - 28.4 mg/dL   Total Protein 7.4 6.5 - 8.1 g/dL   Albumin 3.7 3.5 - 5.0 g/dL   AST 28 15 - 41 U/L   ALT 18 0 - 44 U/L   Alkaline Phosphatase 61 38 - 126 U/L   Total Bilirubin 0.6 0.3 - 1.2 mg/dL   GFR calc non Af Amer >60 >60 mL/min   GFR calc Af Amer >60 >60 mL/min   Anion gap 14 5 - 15  Urinalysis, Microscopic (reflex)  Result Value Ref Range   RBC / HPF 0-5 0 - 5 RBC/hpf   WBC, UA 0-5 0 - 5 WBC/hpf   Bacteria, UA FEW (A) NONE SEEN   Squamous Epithelial / LPF 0-5 0 - 5   Mucus PRESENT    Hyaline Casts, UA PRESENT  Ca Oxalate Crys, UA PRESENT    DG Chest 2  View  Result Date: 10/19/2019 CLINICAL DATA:  Weakness EXAM: CHEST - 2 VIEW COMPARISON:  08/14/2018 FINDINGS: The heart size and mediastinal contours are within normal limits. Both lungs are clear. The visualized skeletal structures are unremarkable. IMPRESSION: No active cardiopulmonary disease. Electronically Signed   By: Alcide Clever M.D.   On: 10/19/2019 01:09   CT Head Wo Contrast  Result Date: 10/19/2019 CLINICAL DATA:  Generalized weakness EXAM: CT HEAD WITHOUT CONTRAST TECHNIQUE: Contiguous axial images were obtained from the base of the skull through the vertex without intravenous contrast. COMPARISON:  None. FINDINGS: Brain: No evidence of acute territorial infarction, hemorrhage, hydrocephalus,extra-axial collection or mass lesion/mass effect. The ventricles are normal in size and contour. Low-attenuation changes in the deep white matter consistent with small vessel ischemia. Vascular: No hyperdense vessel or unexpected calcification. Skull: The skull is intact. No fracture or focal lesion identified. Sinuses/Orbits: The visualized paranasal sinuses and mastoid air cells are clear. The orbits and globes intact. Other: None IMPRESSION: No acute intracranial abnormality. Findings consistent with mild chronic small vessel ischemia Electronically Signed   By: Jonna Clark M.D.   On: 10/19/2019 00:10    Procedures  MDM   Her work-up prior to arrival here has been reviewed: CBC: No significant anemia or leukocytosis. CMP: No significant electrolyte derangement or renal dysfunction. Urinalysis: No UTI APTT: mildly low PT/INR: WNL COVID: Negative CT head: No acute process CXR: No active cardiopulmonary disease.  With patient's thoracic back pain and neurologic symptoms there is some concern for dissection will obtain CT angio to further assess for this.  We will also discuss with neurology regarding MRI imaging.   02:47: CONSULT: Discussed with neurologist Dr. Wilford Corner- aware of patient, in  agreement with MRI brain, C-spine, & T spine wo contrast that is ordered currently.   Patient in MRI, complaining of increased pain in her upper back, 50 mcg of fentanyl ordered, we will also discuss w/ neurosurgery on call.   5:51: CONSULT: Discussed with Doran Durand NP with neurosurgery, team will see this AM.   07:00: Patient care signed out to [facility] PA-C at change of shift pending MRIs, neurosurgery assessment, and disposition.   Findings and plan of care discussed with supervising physician Dr. Nicanor Alcon who is in agreement.          Desmond Lope 10/19/19 0708    Dione Booze, MD 10/21/19 1538

## 2019-10-19 NOTE — ED Notes (Signed)
Patient in Xray

## 2019-10-19 NOTE — Brief Op Note (Signed)
10/18/2019 - 10/19/2019  1:36 PM  PATIENT:  Cynthia Hobbs  64 y.o. female  PRE-OPERATIVE DIAGNOSIS:  Cervical stenosis with myelopathy  POST-OPERATIVE DIAGNOSIS:  Cervical stenosis with myelopathy  PROCEDURE:  Procedure(s): Cervical four-five, Cervical five-six Anterior Cervical Discectomy with Interbody Fusion utilizing Interbody Cages, local harvested autograft and anterior plate instrumentation. (N/A)  SURGEON:  Surgeon(s) and Role:    * Julio Sicks, MD - Primary  PHYSICIAN ASSISTANT:   ASSISTANTSMarland Mcalpine   ANESTHESIA:   general  EBL:  250 mL   BLOOD ADMINISTERED:none  DRAINS: none   LOCAL MEDICATIONS USED:  NONE  SPECIMEN:  No Specimen  DISPOSITION OF SPECIMEN:  N/A  COUNTS:  YES  TOURNIQUET:  * No tourniquets in log *  DICTATION: .Dragon Dictation  PLAN OF CARE: Admit to inpatient   PATIENT DISPOSITION:  PACU - hemodynamically stable.   Delay start of Pharmacological VTE agent (>24hrs) due to surgical blood loss or risk of bleeding: yes

## 2019-10-19 NOTE — Anesthesia Preprocedure Evaluation (Addendum)
Anesthesia Evaluation  Patient identified by MRN, date of birth, ID bandGeneral Assessment Comment:Patient agitated and in pain  Reviewed: Allergy & Precautions, NPO status , Patient's Chart, lab work & pertinent test results, Unable to perform ROS - Chart review onlyPreop documentation limited or incomplete due to emergent nature of procedure.  Airway Mallampati: II  TM Distance: >3 FB Neck ROM: Full    Dental  (+) Missing   Pulmonary Current Smoker and Patient abstained from smoking.,    Pulmonary exam normal breath sounds clear to auscultation       Cardiovascular hypertension, Pt. on medications + CAD, + Peripheral Vascular Disease and + DVT  Normal cardiovascular exam Rhythm:Regular Rate:Normal  ECG: SR, rate 65   Neuro/Psych Severe cervical myelopathy with incomplete spinal cord injury secondary to large central disc herniation at C4-5 and significant stenosis at C5-6 negative psych ROS   GI/Hepatic GERD  ,(+)     substance abuse  ,   Endo/Other  negative endocrine ROS  Renal/GU negative Renal ROS     Musculoskeletal  (+) narcotic dependent  Abdominal   Peds  Hematology  (+) Blood dyscrasia, , HLD   Anesthesia Other Findings Cervical stenosis with myelopathy  Reproductive/Obstetrics                           Anesthesia Physical Anesthesia Plan  ASA: IV and emergent  Anesthesia Plan: General   Post-op Pain Management:    Induction: Intravenous  PONV Risk Score and Plan: 3 and Midazolam, Dexamethasone, Ondansetron and Treatment may vary due to age or medical condition  Airway Management Planned: Oral ETT and Video Laryngoscope Planned  Additional Equipment:   Intra-op Plan:   Post-operative Plan: Extubation in OR  Informed Consent: I have reviewed the patients History and Physical, chart, labs and discussed the procedure including the risks, benefits and alternatives for the  proposed anesthesia with the patient or authorized representative who has indicated his/her understanding and acceptance.     Dental advisory given  Plan Discussed with: CRNA  Anesthesia Plan Comments:       Anesthesia Quick Evaluation

## 2019-10-19 NOTE — ED Notes (Signed)
[facility] notified [name]) - patient ready for transport to [facility] ED

## 2019-10-19 NOTE — ED Notes (Signed)
Pt is being transported to [facility] in the OR at this time.

## 2019-10-19 NOTE — ED Notes (Addendum)
Pt c/o pain between her shoulder blade area.States previous pain medications did not relieve her pain.  Dr. Preston Fleeting updated and orders received.

## 2019-10-19 NOTE — Anesthesia Procedure Notes (Signed)
Procedure Name: Intubation Date/Time: 10/19/2019 11:37 AM Performed by: Nils Pyle, CRNA Pre-anesthesia Checklist: Patient identified, Emergency Drugs available, Suction available and Patient being monitored Patient Re-evaluated:Patient Re-evaluated prior to induction Oxygen Delivery Method: Circle System Utilized Preoxygenation: Pre-oxygenation with 100% oxygen Induction Type: IV induction Ventilation: Mask ventilation without difficulty and Oral airway inserted - appropriate to patient size Laryngoscope Size: Glidescope and 3 Grade View: Grade I Tube type: Oral Tube size: 7.0 mm Number of attempts: 1 Airway Equipment and Method: Stylet and Oral airway Placement Confirmation: ETT inserted through vocal cords under direct vision,  positive ETCO2 and breath sounds checked- equal and bilateral Secured at: 21 cm Tube secured with: Tape Dental Injury: Teeth and Oropharynx as per pre-operative assessment

## 2019-10-19 NOTE — Transfer of Care (Signed)
Immediate Anesthesia Transfer of Care Note  Patient: ALBANA SAPERSTEIN  Procedure(s) Performed: Cervical four-five, Cervical five-six Anterior Cervical Discectomy with Interbody Fusion utilizing Interbody Cages, local harvested autograft and anterior plate instrumentation. (N/A Neck)  Patient Location: PACU  Anesthesia Type:General  Level of Consciousness: awake, alert  and oriented  Airway & Oxygen Therapy: Patient Spontanous Breathing and Patient connected to nasal cannula oxygen  Post-op Assessment: Report given to RN and Post -op Vital signs reviewed and stable  Post vital signs: Reviewed and stable  Last Vitals:  Vitals Value Taken Time  BP 105/57 10/19/19 1355  Temp    Pulse 68 10/19/19 1355  Resp 9 10/19/19 1355  SpO2 90 % 10/19/19 1355  Vitals shown include unvalidated device data.  Last Pain:  Vitals:   10/19/19 1116  TempSrc:   PainSc: 5       Patients Stated Pain Goal: 3 (10/19/19 1116)  Complications: No complications documented.

## 2019-10-19 NOTE — Plan of Care (Signed)
  Problem: Education: Goal: Knowledge of the prescribed therapeutic regimen will improve Outcome: Progressing   Problem: Activity: Goal: Ability to tolerate increased activity will improve Outcome: Progressing   Problem: Clinical Measurements: Goal: Complications related to the disease process, condition or treatment will be avoided or minimized Outcome: Progressing   Problem: Respiratory: Goal: Will regain and/or maintain adequate ventilation Outcome: Progressing   Problem: Skin Integrity: Goal: Demonstration of wound healing without infection will improve Outcome: Progressing   Problem: Clinical Measurements: Goal: Ability to maintain clinical measurements within normal limits will improve Outcome: Progressing Goal: Will remain free from infection Outcome: Progressing Goal: Diagnostic test results will improve Outcome: Progressing Goal: Respiratory complications will improve Outcome: Progressing Goal: Cardiovascular complication will be avoided Outcome: Progressing   Problem: Elimination: Goal: Will not experience complications related to bowel motility Outcome: Progressing Goal: Will not experience complications related to urinary retention Outcome: Progressing   Problem: Pain Managment: Goal: General experience of comfort will improve Outcome: Progressing   Problem: Safety: Goal: Ability to remain free from injury will improve Outcome: Progressing   Problem: Skin Integrity: Goal: Risk for impaired skin integrity will decrease Outcome: Progressing

## 2019-10-19 NOTE — ED Provider Notes (Signed)
[facility]  Refer to previous provider notes for full history and physical examination.  Briefly patient is a 64 year old female transferred from [facility] awaiting MRI for sudden onset weakness, inability to ambulate, urinary retention.  Symptoms concerning for central process, or possible spinal stenosis or abscess.  Foley catheter was placed.  Neurosurgery to see.  She is pending MRI, will plan to call with results.  MDM  DG Chest 2 View  Result Date: 10/19/2019 CLINICAL DATA:  Weakness EXAM: CHEST - 2 VIEW COMPARISON:  08/14/2018 FINDINGS: The heart size and mediastinal contours are within normal limits. Both lungs are clear. The visualized skeletal structures are unremarkable. IMPRESSION: No active cardiopulmonary disease. Electronically Signed   By: Alcide CleverMark  Lukens M.D.   On: 10/19/2019 01:09   CT Head Wo Contrast  Result Date: 10/19/2019 CLINICAL DATA:  Generalized weakness EXAM: CT HEAD WITHOUT CONTRAST TECHNIQUE: Contiguous axial images were obtained from the base of the skull through the vertex without intravenous contrast. COMPARISON:  None. FINDINGS: Brain: No evidence of acute territorial infarction, hemorrhage, hydrocephalus,extra-axial collection or mass lesion/mass effect. The ventricles are normal in size and contour. Low-attenuation changes in the deep white matter consistent with small vessel ischemia. Vascular: No hyperdense vessel or unexpected calcification. Skull: The skull is intact. No fracture or focal lesion identified. Sinuses/Orbits: The visualized paranasal sinuses and mastoid air cells are clear. The orbits and globes intact. Other: None IMPRESSION: No acute intracranial abnormality. Findings consistent with mild chronic small vessel ischemia Electronically Signed   By: Jonna ClarkBindu  Avutu M.D.   On: 10/19/2019 00:10   MR BRAIN WO CONTRAST  Result Date: 10/19/2019 CLINICAL DATA:  Initial evaluation for acute generalized weakness,  inability to stand with loss of grip bilaterally. EXAM: MRI HEAD WITHOUT CONTRAST MRI CERVICAL SPINE WITHOUT CONTRAST MRI THORACIC SPINE WITHOUT CONTRAST TECHNIQUE: Multiplanar, multiecho pulse sequences of the brain and surrounding structures, and cervical spine, to include the craniocervical junction and cervicothoracic junction, and thoracic spine were obtained without intravenous contrast. COMPARISON:  None available. FINDINGS: MRI HEAD FINDINGS Brain: Examination moderately degraded by motion artifact. Cerebral volume within normal limits for age. Patchy T2/FLAIR hyperintensity seen involving the periventricular, deep, and subcortical white matter both cerebral hemispheres, nonspecific, but overall moderate in appearance. No abnormal foci of restricted diffusion to suggest acute or subacute ischemia. Gray-white matter differentiation maintained. No encephalomalacia to suggest chronic cortical infarction. No foci of susceptibility artifact to suggest acute or chronic intracranial hemorrhage. No mass lesion, midline shift or mass effect. No hydrocephalus or extra-axial fluid collection. Pituitary gland suprasellar region normal. Midline structures intact. Vascular: Major intracranial vascular flow voids are grossly maintained at the skull base. Skull and upper cervical spine: Craniocervical junction normal. Bone marrow signal intensity within normal limits. No scalp soft tissue abnormality. Sinuses/Orbits: Globes and orbital soft tissues grossly within normal limits. Paranasal sinuses are largely clear. Trace left mastoid effusion noted, of doubtful significance. Other: None. MRI CERVICAL SPINE FINDINGS Alignment: Examination markedly degraded by motion artifact. Straightening of the normal cervical lordosis. Trace anterolisthesis of C7 on T1. Vertebrae: Vertebral body height maintained without evidence for acute or chronic fracture. Bone marrow signal intensity within normal limits. No worrisome osseous lesions.  No abnormal marrow edema. Cord: Abnormal cord signal intensity seen within the cervical spinal cord extending from C3 through C6-7 (series 16, image 7). Cord is somewhat swollen in appearance, suggesting a edema rather than myelomalacia. Finding favored to be secondary to disc herniation and  cord compression at the C4-5 level. Otherwise, signal intensity within the cord is grossly within normal limits on this motion degraded exam. Posterior Fossa, vertebral arteries, paraspinal tissues: Craniocervical junction normal. Paraspinous and prevertebral soft tissues normal. Normal flow voids seen within the vertebral arteries bilaterally. Disc levels: C2-C3: Negative interspace. Left-sided facet degeneration. No spinal stenosis. Mild left C3 foraminal narrowing. No significant right foraminal encroachment. C3-C4: Central disc protrusion indents the ventral thecal sac. Superimposed left-sided facet degeneration with ligament flavum hypertrophy. Resultant mild to moderate spinal stenosis with mild cord flattening. Moderate to severe left C4 foraminal stenosis. No significant right foraminal narrowing. C4-C5: Large central disc protrusion with associated superior and inferior migration of disc material (series 18, image 13). Protruding disc impinges upon the cervical spinal cord with secondary cord compression and cord signal changes. Thecal sac measures 3 mm in AP diameter at its most narrow point. Superimposed right-sided uncovertebral hypertrophy with suspected mild right C5 foraminal stenosis. No significant left foraminal narrowing. C5-C6: Chronic intervertebral disc space narrowing with diffuse degenerative disc osteophyte. Broad posterior component flattens and effaces the ventral thecal sac. Moderate to severe spinal stenosis. Thecal sac measures approximately 6 mm in AP diameter. Moderate to severe left worse than right C6 foraminal narrowing. C6-C7: Diffuse disc bulge with bilateral uncovertebral hypertrophy. Disc  bulge eccentric to the right. Flattening of the ventral thecal sac with resultant mild-to-moderate spinal stenosis. Severe right with moderate left C7 foraminal narrowing. C7-T1: Trace anterolisthesis. Minimal disc bulge. Moderate bilateral facet hypertrophy. No spinal stenosis. Probable mild bilateral C8 foraminal narrowing. MRI THORACIC SPINE FINDINGS Alignment: Straightening of the normal midthoracic kyphosis. Trace anterolisthesis of T2 on T3 and T3 on T4. Vertebrae: Compression deformity involving the inferior endplate of T12 with mild 30% height loss without bony retropulsion. Minimal residual marrow edema, suggesting a late subacute to chronic compression fracture. Additional chronic compression deformities involving the superior endplates of T4, T9, and L1 noted. No acute compression fracture. Underlying bone marrow signal intensity within normal limits. Multiple scattered benign hemangiomata noted. No worrisome osseous lesions. No other abnormal marrow edema. Cord: Signal intensity within the thoracic spinal cord is grossly within normal limits. Posterior Fossa, vertebral arteries, paraspinal tissues: Paraspinous soft tissues within normal limits. Partially visualized lungs are grossly clear. 12 mm simple cyst noted within the liver. Cholelithiasis noted. Few scattered T2 hyperintense simple renal cyst noted. There is a 12 mm T2 hypointense exophytic lesion extending from the posterior left kidney, indeterminate (series 25, image 52). Disc levels: T8-9: Small left paracentral disc protrusion mildly indents the left ventral thecal sac (series 25, image 35). Minimal flattening of the ventral cord without significant stenosis. No other significant disc pathology seen within the thoracic spine. Mild diffuse prominence of the dorsal epidural fat noted throughout the midthoracic spine. No other significant stenosis. IMPRESSION: MRI HEAD IMPRESSION: 1. Motion degraded exam. 2. No acute intracranial abnormality. 3.  Scattered T2/FLAIR hyperintensity involving the supratentorial cerebral white matter, nonspecific, but most commonly related to chronic microvascular ischemic disease. Overall, appearance is moderate in nature. MRI CERVICAL SPINE IMPRESSION: 1. Motion degraded exam. 2. Large central disc protrusion at C4-5 with resultant severe spinal stenosis and cord compression. Cord signal changes extending from C3 through C6-7 concerning for edema. Emergent neuro surgical consultation recommended. 3. Additional cervical spondylosis at C3-4, C5-6, and C6-7. Additional moderate to severe spinal stenosis at C5-6, with mild to moderate canal narrowing at C3-4 and C6-7. Moderate to severe bilateral foraminal narrowing at C4, C6, and C7 as above.  MRI THORACIC SPINE IMPRESSION: 1. No acute abnormality within the thoracic spine. Normal MRI appearance of the thoracic spinal cord. 2. Late subacute to chronic compression deformity involving the inferior endplate of T12 with mild 30% height loss without bony retropulsion. Additional mild chronic compression deformities of T4, T9, and L1. 3. Left paracentral disc protrusion at T8-9 without significant stenosis. 4. 12 mm T2 hypointense exophytic lesion extending from the posterior left kidney, indeterminate. Further assessment with dedicated renal mass protocol CT and/or MRI recommended for further evaluation. This could be performed on a nonemergent outpatient basis. 5. Cholelithiasis. Critical Value/emergent results were called by telephone at the time of interpretation on 10/19/2019 at 7:43 am to provider Michela Pitcher , who verbally acknowledged these results. Electronically Signed   By: Rise Mu M.D.   On: 10/19/2019 07:48   MR Cervical Spine Wo Contrast  Result Date: 10/19/2019 CLINICAL DATA:  Initial evaluation for acute generalized weakness, inability to stand with loss of grip bilaterally. EXAM: MRI HEAD WITHOUT CONTRAST MRI CERVICAL SPINE WITHOUT CONTRAST MRI THORACIC  SPINE WITHOUT CONTRAST TECHNIQUE: Multiplanar, multiecho pulse sequences of the brain and surrounding structures, and cervical spine, to include the craniocervical junction and cervicothoracic junction, and thoracic spine were obtained without intravenous contrast. COMPARISON:  None available. FINDINGS: MRI HEAD FINDINGS Brain: Examination moderately degraded by motion artifact. Cerebral volume within normal limits for age. Patchy T2/FLAIR hyperintensity seen involving the periventricular, deep, and subcortical white matter both cerebral hemispheres, nonspecific, but overall moderate in appearance. No abnormal foci of restricted diffusion to suggest acute or subacute ischemia. Gray-white matter differentiation maintained. No encephalomalacia to suggest chronic cortical infarction. No foci of susceptibility artifact to suggest acute or chronic intracranial hemorrhage. No mass lesion, midline shift or mass effect. No hydrocephalus or extra-axial fluid collection. Pituitary gland suprasellar region normal. Midline structures intact. Vascular: Major intracranial vascular flow voids are grossly maintained at the skull base. Skull and upper cervical spine: Craniocervical junction normal. Bone marrow signal intensity within normal limits. No scalp soft tissue abnormality. Sinuses/Orbits: Globes and orbital soft tissues grossly within normal limits. Paranasal sinuses are largely clear. Trace left mastoid effusion noted, of doubtful significance. Other: None. MRI CERVICAL SPINE FINDINGS Alignment: Examination markedly degraded by motion artifact. Straightening of the normal cervical lordosis. Trace anterolisthesis of C7 on T1. Vertebrae: Vertebral body height maintained without evidence for acute or chronic fracture. Bone marrow signal intensity within normal limits. No worrisome osseous lesions. No abnormal marrow edema. Cord: Abnormal cord signal intensity seen within the cervical spinal cord extending from C3 through C6-7  (series 16, image 7). Cord is somewhat swollen in appearance, suggesting a edema rather than myelomalacia. Finding favored to be secondary to disc herniation and cord compression at the C4-5 level. Otherwise, signal intensity within the cord is grossly within normal limits on this motion degraded exam. Posterior Fossa, vertebral arteries, paraspinal tissues: Craniocervical junction normal. Paraspinous and prevertebral soft tissues normal. Normal flow voids seen within the vertebral arteries bilaterally. Disc levels: C2-C3: Negative interspace. Left-sided facet degeneration. No spinal stenosis. Mild left C3 foraminal narrowing. No significant right foraminal encroachment. C3-C4: Central disc protrusion indents the ventral thecal sac. Superimposed left-sided facet degeneration with ligament flavum hypertrophy. Resultant mild to moderate spinal stenosis with mild cord flattening. Moderate to severe left C4 foraminal stenosis. No significant right foraminal narrowing. C4-C5: Large central disc protrusion with associated superior and inferior migration of disc material (series 18, image 13). Protruding disc impinges upon the cervical spinal cord with secondary cord  compression and cord signal changes. Thecal sac measures 3 mm in AP diameter at its most narrow point. Superimposed right-sided uncovertebral hypertrophy with suspected mild right C5 foraminal stenosis. No significant left foraminal narrowing. C5-C6: Chronic intervertebral disc space narrowing with diffuse degenerative disc osteophyte. Broad posterior component flattens and effaces the ventral thecal sac. Moderate to severe spinal stenosis. Thecal sac measures approximately 6 mm in AP diameter. Moderate to severe left worse than right C6 foraminal narrowing. C6-C7: Diffuse disc bulge with bilateral uncovertebral hypertrophy. Disc bulge eccentric to the right. Flattening of the ventral thecal sac with resultant mild-to-moderate spinal stenosis. Severe right  with moderate left C7 foraminal narrowing. C7-T1: Trace anterolisthesis. Minimal disc bulge. Moderate bilateral facet hypertrophy. No spinal stenosis. Probable mild bilateral C8 foraminal narrowing. MRI THORACIC SPINE FINDINGS Alignment: Straightening of the normal midthoracic kyphosis. Trace anterolisthesis of T2 on T3 and T3 on T4. Vertebrae: Compression deformity involving the inferior endplate of T12 with mild 30% height loss without bony retropulsion. Minimal residual marrow edema, suggesting a late subacute to chronic compression fracture. Additional chronic compression deformities involving the superior endplates of T4, T9, and L1 noted. No acute compression fracture. Underlying bone marrow signal intensity within normal limits. Multiple scattered benign hemangiomata noted. No worrisome osseous lesions. No other abnormal marrow edema. Cord: Signal intensity within the thoracic spinal cord is grossly within normal limits. Posterior Fossa, vertebral arteries, paraspinal tissues: Paraspinous soft tissues within normal limits. Partially visualized lungs are grossly clear. 12 mm simple cyst noted within the liver. Cholelithiasis noted. Few scattered T2 hyperintense simple renal cyst noted. There is a 12 mm T2 hypointense exophytic lesion extending from the posterior left kidney, indeterminate (series 25, image 52). Disc levels: T8-9: Small left paracentral disc protrusion mildly indents the left ventral thecal sac (series 25, image 35). Minimal flattening of the ventral cord without significant stenosis. No other significant disc pathology seen within the thoracic spine. Mild diffuse prominence of the dorsal epidural fat noted throughout the midthoracic spine. No other significant stenosis. IMPRESSION: MRI HEAD IMPRESSION: 1. Motion degraded exam. 2. No acute intracranial abnormality. 3. Scattered T2/FLAIR hyperintensity involving the supratentorial cerebral white matter, nonspecific, but most commonly related to  chronic microvascular ischemic disease. Overall, appearance is moderate in nature. MRI CERVICAL SPINE IMPRESSION: 1. Motion degraded exam. 2. Large central disc protrusion at C4-5 with resultant severe spinal stenosis and cord compression. Cord signal changes extending from C3 through C6-7 concerning for edema. Emergent neuro surgical consultation recommended. 3. Additional cervical spondylosis at C3-4, C5-6, and C6-7. Additional moderate to severe spinal stenosis at C5-6, with mild to moderate canal narrowing at C3-4 and C6-7. Moderate to severe bilateral foraminal narrowing at C4, C6, and C7 as above. MRI THORACIC SPINE IMPRESSION: 1. No acute abnormality within the thoracic spine. Normal MRI appearance of the thoracic spinal cord. 2. Late subacute to chronic compression deformity involving the inferior endplate of T12 with mild 30% height loss without bony retropulsion. Additional mild chronic compression deformities of T4, T9, and L1. 3. Left paracentral disc protrusion at T8-9 without significant stenosis. 4. 12 mm T2 hypointense exophytic lesion extending from the posterior left kidney, indeterminate. Further assessment with dedicated renal mass protocol CT and/or MRI recommended for further evaluation. This could be performed on a nonemergent outpatient basis. 5. Cholelithiasis. Critical Value/emergent results were called by telephone at the time of interpretation on 10/19/2019 at 7:43 am to provider Michela Pitcher , who verbally acknowledged these results. Electronically Signed   By: Rise Mu  M.D.   On: 10/19/2019 07:48   MR THORACIC SPINE WO CONTRAST  Result Date: 10/19/2019 CLINICAL DATA:  Initial evaluation for acute generalized weakness, inability to stand with loss of grip bilaterally. EXAM: MRI HEAD WITHOUT CONTRAST MRI CERVICAL SPINE WITHOUT CONTRAST MRI THORACIC SPINE WITHOUT CONTRAST TECHNIQUE: Multiplanar, multiecho pulse sequences of the brain and surrounding structures, and cervical  spine, to include the craniocervical junction and cervicothoracic junction, and thoracic spine were obtained without intravenous contrast. COMPARISON:  None available. FINDINGS: MRI HEAD FINDINGS Brain: Examination moderately degraded by motion artifact. Cerebral volume within normal limits for age. Patchy T2/FLAIR hyperintensity seen involving the periventricular, deep, and subcortical white matter both cerebral hemispheres, nonspecific, but overall moderate in appearance. No abnormal foci of restricted diffusion to suggest acute or subacute ischemia. Gray-white matter differentiation maintained. No encephalomalacia to suggest chronic cortical infarction. No foci of susceptibility artifact to suggest acute or chronic intracranial hemorrhage. No mass lesion, midline shift or mass effect. No hydrocephalus or extra-axial fluid collection. Pituitary gland suprasellar region normal. Midline structures intact. Vascular: Major intracranial vascular flow voids are grossly maintained at the skull base. Skull and upper cervical spine: Craniocervical junction normal. Bone marrow signal intensity within normal limits. No scalp soft tissue abnormality. Sinuses/Orbits: Globes and orbital soft tissues grossly within normal limits. Paranasal sinuses are largely clear. Trace left mastoid effusion noted, of doubtful significance. Other: None. MRI CERVICAL SPINE FINDINGS Alignment: Examination markedly degraded by motion artifact. Straightening of the normal cervical lordosis. Trace anterolisthesis of C7 on T1. Vertebrae: Vertebral body height maintained without evidence for acute or chronic fracture. Bone marrow signal intensity within normal limits. No worrisome osseous lesions. No abnormal marrow edema. Cord: Abnormal cord signal intensity seen within the cervical spinal cord extending from C3 through C6-7 (series 16, image 7). Cord is somewhat swollen in appearance, suggesting a edema rather than myelomalacia. Finding favored to  be secondary to disc herniation and cord compression at the C4-5 level. Otherwise, signal intensity within the cord is grossly within normal limits on this motion degraded exam. Posterior Fossa, vertebral arteries, paraspinal tissues: Craniocervical junction normal. Paraspinous and prevertebral soft tissues normal. Normal flow voids seen within the vertebral arteries bilaterally. Disc levels: C2-C3: Negative interspace. Left-sided facet degeneration. No spinal stenosis. Mild left C3 foraminal narrowing. No significant right foraminal encroachment. C3-C4: Central disc protrusion indents the ventral thecal sac. Superimposed left-sided facet degeneration with ligament flavum hypertrophy. Resultant mild to moderate spinal stenosis with mild cord flattening. Moderate to severe left C4 foraminal stenosis. No significant right foraminal narrowing. C4-C5: Large central disc protrusion with associated superior and inferior migration of disc material (series 18, image 13). Protruding disc impinges upon the cervical spinal cord with secondary cord compression and cord signal changes. Thecal sac measures 3 mm in AP diameter at its most narrow point. Superimposed right-sided uncovertebral hypertrophy with suspected mild right C5 foraminal stenosis. No significant left foraminal narrowing. C5-C6: Chronic intervertebral disc space narrowing with diffuse degenerative disc osteophyte. Broad posterior component flattens and effaces the ventral thecal sac. Moderate to severe spinal stenosis. Thecal sac measures approximately 6 mm in AP diameter. Moderate to severe left worse than right C6 foraminal narrowing. C6-C7: Diffuse disc bulge with bilateral uncovertebral hypertrophy. Disc bulge eccentric to the right. Flattening of the ventral thecal sac with resultant mild-to-moderate spinal stenosis. Severe right with moderate left C7 foraminal narrowing. C7-T1: Trace anterolisthesis. Minimal disc bulge. Moderate bilateral facet  hypertrophy. No spinal stenosis. Probable mild bilateral C8 foraminal narrowing. MRI THORACIC SPINE  FINDINGS Alignment: Straightening of the normal midthoracic kyphosis. Trace anterolisthesis of T2 on T3 and T3 on T4. Vertebrae: Compression deformity involving the inferior endplate of T12 with mild 30% height loss without bony retropulsion. Minimal residual marrow edema, suggesting a late subacute to chronic compression fracture. Additional chronic compression deformities involving the superior endplates of T4, T9, and L1 noted. No acute compression fracture. Underlying bone marrow signal intensity within normal limits. Multiple scattered benign hemangiomata noted. No worrisome osseous lesions. No other abnormal marrow edema. Cord: Signal intensity within the thoracic spinal cord is grossly within normal limits. Posterior Fossa, vertebral arteries, paraspinal tissues: Paraspinous soft tissues within normal limits. Partially visualized lungs are grossly clear. 12 mm simple cyst noted within the liver. Cholelithiasis noted. Few scattered T2 hyperintense simple renal cyst noted. There is a 12 mm T2 hypointense exophytic lesion extending from the posterior left kidney, indeterminate (series 25, image 52). Disc levels: T8-9: Small left paracentral disc protrusion mildly indents the left ventral thecal sac (series 25, image 35). Minimal flattening of the ventral cord without significant stenosis. No other significant disc pathology seen within the thoracic spine. Mild diffuse prominence of the dorsal epidural fat noted throughout the midthoracic spine. No other significant stenosis. IMPRESSION: MRI HEAD IMPRESSION: 1. Motion degraded exam. 2. No acute intracranial abnormality. 3. Scattered T2/FLAIR hyperintensity involving the supratentorial cerebral white matter, nonspecific, but most commonly related to chronic microvascular ischemic disease. Overall, appearance is moderate in nature. MRI CERVICAL SPINE IMPRESSION: 1.  Motion degraded exam. 2. Large central disc protrusion at C4-5 with resultant severe spinal stenosis and cord compression. Cord signal changes extending from C3 through C6-7 concerning for edema. Emergent neuro surgical consultation recommended. 3. Additional cervical spondylosis at C3-4, C5-6, and C6-7. Additional moderate to severe spinal stenosis at C5-6, with mild to moderate canal narrowing at C3-4 and C6-7. Moderate to severe bilateral foraminal narrowing at C4, C6, and C7 as above. MRI THORACIC SPINE IMPRESSION: 1. No acute abnormality within the thoracic spine. Normal MRI appearance of the thoracic spinal cord. 2. Late subacute to chronic compression deformity involving the inferior endplate of T12 with mild 30% height loss without bony retropulsion. Additional mild chronic compression deformities of T4, T9, and L1. 3. Left paracentral disc protrusion at T8-9 without significant stenosis. 4. 12 mm T2 hypointense exophytic lesion extending from the posterior left kidney, indeterminate. Further assessment with dedicated renal mass protocol CT and/or MRI recommended for further evaluation. This could be performed on a nonemergent outpatient basis. 5. Cholelithiasis. Critical Value/emergent results were called by telephone at the time of interpretation on 10/19/2019 at 7:43 am to provider Michela Pitcher , who verbally acknowledged these results. Electronically Signed   By: Rise Mu M.D.   On: 10/19/2019 07:48   CT Angio Chest/Abd/Pel for Dissection W and/or W/WO  Result Date: 10/19/2019 CLINICAL DATA:  Chest and back pain. Clinical suspicion for aortic dissection. EXAM: CT ANGIOGRAPHY CHEST, ABDOMEN AND PELVIS TECHNIQUE: Non-contrast CT of the chest was initially obtained. Multidetector CT imaging through the chest, abdomen and pelvis was performed using the standard protocol during bolus administration of intravenous contrast. Multiplanar reconstructed images and MIPs were obtained and reviewed to  evaluate the vascular anatomy. CONTRAST:  OMNIPAQUE IOHEXOL 350 MG/ML SOLN COMPARISON:  02/06/2018 FINDINGS: CTA CHEST FINDINGS Cardiovascular: Thoracic aorta is normal in caliber. No dissection. Minimal atherosclerosis along the descending portion. Arch branch vessels are widely patent. Heart is normal in size. No pericardial effusion. Mild three-vessel coronary artery calcifications. Pulmonary arteries  are well opacified. No evidence of a pulmonary embolism. Mediastinum/Nodes: No enlarged mediastinal, hilar, or axillary lymph nodes. Thyroid gland, trachea, and esophagus demonstrate no significant findings. Lungs/Pleura: Mild centrilobular emphysema. Minor atelectasis in the posterior lung bases. Lungs otherwise clear. No pleural effusion or pneumothorax. Musculoskeletal: Mild compression fracture of T12. This is new compared to radiographs from 05/12/2008. It is not visualized on the CT lung cancer screening exam from 02/06/2018. There is a minor depression of the upper endplate of T4 which is chronic. No other fractures. No osteoblastic or osteolytic lesions. Review of the MIP images confirms the above findings. CTA ABDOMEN AND PELVIS FINDINGS VASCULAR Aorta: Atherosclerotic plaque mostly along the infrarenal abdominal aorta. No significant stenosis. No dissection and no aneurysm. Celiac: Patent without evidence of aneurysm, dissection, vasculitis or significant stenosis. SMA: Patent without evidence of aneurysm, dissection, vasculitis or significant stenosis. Renals: Atherosclerotic plaque noted just beyond the origins of both renal arteries, both vessels narrowed by 50%. IMA: Atherosclerotic plaque at the origin. Vessel these mildly narrowed, but patent. Inflow: Atherosclerotic plaque along the common, internal and external iliac arteries. No significant stenosis. Veins: No obvious venous abnormality within the limitations of this arterial phase study. Review of the MIP images confirms the above findings.  NON-VASCULAR Hepatobiliary: Stable low-density lesion at the dome adjacent to the intrahepatic inferior vena cava, 11 mm in size, consistent with a cyst. Liver normal in size and overall attenuation. No other masses or lesions. Dependent gallstones. No evidence of acute cholecystitis. No bile duct dilation. Pancreas: Unremarkable. No pancreatic ductal dilatation or surrounding inflammatory changes. Spleen: Normal in size without focal abnormality. Adrenals/Urinary Tract: Kidneys normal in size, orientation and position. Four low-density left renal masses, largest from the anterior midpole, 3 cm, all consistent with cysts. No other masses, no stones and no hydronephrosis. Ureters normal in course and in caliber. Bladder decompressed with a Foley catheter. Stomach/Bowel: Stomach is within normal limits. Appendix appears normal. No evidence of bowel wall thickening, distention, or inflammatory changes. Lymphatic: No enlarged lymph nodes. Reproductive: Uterus and bilateral adnexa are unremarkable. Other: No abdominal wall hernia or abnormality. No abdominopelvic ascites. Musculoskeletal: Mild depression of the upper endplate of L3 with associated Schmorl's node, which appears chronic. Compression fracture of T12 as detailed under the chest section. No other fractures. No osteoblastic or osteolytic lesions. Previous left proximal femur fracture reduced with a compression screw and intramedullary rod. Review of the MIP images confirms the above findings. IMPRESSION: CTA FINDINGS 1. No thoracoabdominal aortic dissection.  No aneurysm. 2. Minor atherosclerotic plaque along the descending thoracic aorta with more notable plaque along the abdominal aorta. No significant stenosis. 3. Mild, proximally 50%, atherosclerotic narrowing of the renal arteries. No other branch vessel narrowing. NON CTA FINDINGS 1. No acute findings within the chest, abdomen or pelvis. 2. Mild centrilobular emphysema. Mild three-vessel coronary artery  calcifications. 3. Left renal cysts.  Gallstones. Electronically Signed   By: Amie Portland M.D.   On: 10/19/2019 04:29     7:45AM Spoke with Dr. Phill Myron with radiology.  Patient has a rather large disc protrusion at C4-5 with long segment edema from C3-C7.  Will consult neurosurgery to inform.  8:00AM CONSULT: Spoke with Hildred Priest with neurosurgery, they are aware of MRI results.    Bennye Alm 10/19/19 [id number]    Palumbo, April, MD 10/22/19 [id number]

## 2019-10-19 NOTE — H&P (Signed)
Cynthia Hobbs is an 64 y.o. female.   Chief Complaint: Weakness HPI: 64 year old female with severe acute onset bilateral upper and lower extremity weakness.  Patient states that she was sleeping in a chair awkwardly 2 nights ago when she awakened with significant neck pain and numbness in both arms and legs.  She has had profound weakness in both upper and lower extremities.  She reports no history of trauma.  She has no prior history similar to this.  The patient symptoms have have remained.  She has been evaluated today in the emergency department including MRI scans of her entire neural axis.  MRI scanning of her cervical spine demonstrates evidence of very large C4-5 disc herniation with severe spinal canal compromise with marked cord compression and severe signal abnormality throughout the cord from C3-C7.  She has underlying spondylosis with significant multifactorial stenosis at C5-6 and mild to moderate stenosis at 3 4 and 6 7.  MRI scanning of her brain and thoracic spine is negative.  She complains of inability to use her hands.  She is unable to walk.  She is unable to void.  Past Medical History:  Diagnosis Date  . GERD (gastroesophageal reflux disease)   . Hypertension   . PAD (peripheral artery disease) (HCC)     Past Surgical History:  Procedure Laterality Date  . ABDOMINAL AORTOGRAM W/LOWER EXTREMITY Bilateral 04/29/2018   Procedure: ABDOMINAL AORTOGRAM W/LOWER EXTREMITY;  Surgeon: Runell Gess, MD;  Location: [facility] INVASIVE CV LAB;  Service: Cardiovascular;  Laterality: Bilateral;  . BACK SURGERY    . BREAST EXCISIONAL BIOPSY Right    benign  . CAST APPLICATION Left 08/14/2018   Procedure: APPLICATION OF SPLINT LEFT LOWER LEG;  Surgeon: Roby Lofts, MD;  Location: [facility] OR;  Service: Orthopedics;  Laterality: Left;  . INTRAMEDULLARY (IM) NAIL INTERTROCHANTERIC Left 08/14/2018   Procedure: INTRAMEDULLARY (IM) NAIL INTERTROCHANTRIC;  Surgeon: Roby Lofts, MD;  Location: [facility] OR;   Service: Orthopedics;  Laterality: Left;  . ORIF HUMERUS FRACTURE Left 08/14/2018   Procedure: Open Reduction Internal Fixation (Orif) Proximal Humerus Fracture;  Surgeon: Roby Lofts, MD;  Location: [facility] OR;  Service: Orthopedics;  Laterality: Left;  . PERIPHERAL VASCULAR ATHERECTOMY Right 04/29/2018   Procedure: PERIPHERAL VASCULAR ATHERECTOMY;  Surgeon: Runell Gess, MD;  Location: [facility] INVASIVE CV LAB;  Service: Cardiovascular;  Laterality: Right;  SFA    Family History  Problem Relation Age of Onset  . Hypertension Mother   . Dementia Mother   . Cancer Father    Social History:  reports that she has been smoking cigarettes. She has never used smokeless tobacco. She reports current alcohol use. She reports current drug use. Drug: Marijuana.  Allergies: No Known Allergies  (Not in a hospital admission)   Results for orders placed or performed during the hospital encounter of 10/18/19 (from the past 48 hour(s))  CBC with Differential     Status: None   Collection Time: 10/19/19 12:23 AM  Result Value Ref Range   WBC 7.9 4.0 - 10.5 K/uL   RBC 4.82 3.87 - 5.11 MIL/uL   Hemoglobin 13.9 12.0 - 15.0 g/dL   HCT 16.1 36 - 46 %   MCV 95.2 80.0 - 100.0 fL   MCH 28.8 26.0 - 34.0 pg   MCHC 30.3 30.0 - 36.0 g/dL   RDW 09.6 04.5 - 40.9 %   Platelets 214 150 - 400 K/uL   nRBC 0.0 0.0 - 0.2 %   Neutrophils  Relative % 69 %   Neutro Abs 5.5 1.7 - 7.7 K/uL   Lymphocytes Relative 21 %   Lymphs Abs 1.7 0.7 - 4.0 K/uL   Monocytes Relative 8 %   Monocytes Absolute 0.6 0 - 1 K/uL   Eosinophils Relative 1 %   Eosinophils Absolute 0.0 0 - 0 K/uL   Basophils Relative 1 %   Basophils Absolute 0.0 0 - 0 K/uL   Immature Granulocytes 0 %   Abs Immature Granulocytes 0.03 0.00 - 0.07 K/uL    Comment: Performed at [facility], [address] [place], [place] [address]  Protime-INR     Status: None   Collection Time: 10/19/19 12:23 AM  Result Value Ref Range   Prothrombin Time 12.7  11.4 - 15.2 seconds   INR 1.0 0.8 - 1.2    Comment: (NOTE) INR goal varies based on device and disease states. Performed at [facility], [address] [place], [place] [address]   APTT     Status: Abnormal   Collection Time: 10/19/19 12:23 AM  Result Value Ref Range   aPTT 21 (L) 24 - 36 seconds    Comment: Performed at [facility], [address] [place], [place] [address]  Urinalysis, Routine w reflex microscopic     Status: Abnormal   Collection Time: 10/19/19  1:00 AM  Result Value Ref Range   Color, Urine ORANGE (A) YELLOW    Comment: BIOCHEMICALS MAY BE AFFECTED BY COLOR   APPearance CLOUDY (A) CLEAR   Specific Gravity, Urine >1.030 (H) 1.005 - 1.030   pH 6.0 5.0 - 8.0   Glucose, UA NEGATIVE NEGATIVE mg/dL   Hgb urine dipstick NEGATIVE NEGATIVE   Bilirubin Urine SMALL (A) NEGATIVE   Ketones, ur NEGATIVE NEGATIVE mg/dL   Protein, ur 30 (A) NEGATIVE mg/dL   Nitrite NEGATIVE NEGATIVE   Leukocytes,Ua NEGATIVE NEGATIVE    Comment: Performed at [facility], [address] [place], [place] [address]  SARS Coronavirus 2 by RT PCR (hospital order, performed in [facility] hospital lab) Nasopharyngeal Nasopharyngeal Swab     Status: None   Collection Time: 10/19/19  1:00 AM   Specimen: Nasopharyngeal Swab  Result Value Ref Range   SARS Coronavirus 2 NEGATIVE NEGATIVE    Comment: (NOTE) SARS-CoV-2 target nucleic acids are NOT DETECTED.  The SARS-CoV-2 RNA is generally detectable in upper and lower respiratory specimens during the acute phase of infection. The lowest concentration of SARS-CoV-2 viral copies this assay can detect is 250 copies / mL. A negative result does not preclude SARS-CoV-2 infection and should not be used as the sole basis for treatment or other patient management decisions.  A negative result may occur with improper specimen collection / handling, submission of specimen other than nasopharyngeal swab, presence of  viral mutation(s) within the areas targeted by this assay, and inadequate number of viral copies (<250 copies / mL). A negative result must be combined with clinical observations, patient history, and epidemiological information.  Fact Sheet for Patients:   BoilerBrush.com.cy  Fact Sheet for Healthcare Providers: https://pope.com/  This test is not yet approved or  cleared by the [place] FDA and has been authorized for detection and/or diagnosis of SARS-CoV-2 by FDA under an Emergency Use Authorization (EUA).  This EUA will remain in effect (meaning this test can be used) for the duration of the COVID-19 declaration under Section 564(b)(1) of the Act, 21 U.S.C.  section 360bbb-3(b)(1), unless the authorization is terminated or revoked sooner.  Performed at [facility], [address] [place], [place] [address]   Comprehensive metabolic panel     Status: Abnormal   Collection Time: 10/19/19  1:00 AM  Result Value Ref Range   Sodium 139 135 - 145 mmol/L   Potassium 3.9 3.5 - 5.1 mmol/L   Chloride 97 (L) 98 - 111 mmol/L   CO2 28 22 - 32 mmol/L   Glucose, Bld 107 (H) 70 - 99 mg/dL    Comment: Glucose reference range applies only to samples taken after fasting for at least 8 hours.   BUN 16 8 - 23 mg/dL   Creatinine, Ser 1.91 0.44 - 1.00 mg/dL   Calcium 47.8 8.9 - 29.5 mg/dL   Total Protein 7.4 6.5 - 8.1 g/dL   Albumin 3.7 3.5 - 5.0 g/dL   AST 28 15 - 41 U/L   ALT 18 0 - 44 U/L   Alkaline Phosphatase 61 38 - 126 U/L   Total Bilirubin 0.6 0.3 - 1.2 mg/dL   GFR calc non Af Amer >60 >60 mL/min   GFR calc Af Amer >60 >60 mL/min   Anion gap 14 5 - 15    Comment: Performed at [facility], [address] [place], [place] [address]  Urinalysis, Microscopic (reflex)     Status: Abnormal   Collection Time: 10/19/19  1:00 AM  Result Value Ref Range   RBC / HPF 0-5 0 - 5 RBC/hpf   WBC, UA 0-5 0 - 5 WBC/hpf    Bacteria, UA FEW (A) NONE SEEN   Squamous Epithelial / LPF 0-5 0 - 5   Mucus PRESENT    Hyaline Casts, UA PRESENT    Ca Oxalate Crys, UA PRESENT     Comment: Performed at [facility], [address] [place], [place] [address]  Rapid urine drug screen (hospital performed)     Status: Abnormal   Collection Time: 10/19/19  4:51 AM  Result Value Ref Range   Opiates POSITIVE (A) NONE DETECTED   Cocaine NONE DETECTED NONE DETECTED   Benzodiazepines NONE DETECTED NONE DETECTED   Amphetamines NONE DETECTED NONE DETECTED   Tetrahydrocannabinol NONE DETECTED NONE DETECTED   Barbiturates NONE DETECTED NONE DETECTED    Comment: (NOTE) DRUG SCREEN FOR MEDICAL PURPOSES ONLY.  IF CONFIRMATION IS NEEDED FOR ANY PURPOSE, NOTIFY LAB WITHIN 5 DAYS.  LOWEST DETECTABLE LIMITS FOR URINE DRUG SCREEN Drug Class                     Cutoff (ng/mL) Amphetamine and metabolites    1000 Barbiturate and metabolites    200 Benzodiazepine                 200 Tricyclics and metabolites     300 Opiates and metabolites        300 Cocaine and metabolites        300 THC                            50 Performed at [facility], [address], [place], [place] [address]    DG Chest 2 View  Result Date: 10/19/2019 CLINICAL DATA:  Weakness EXAM: CHEST - 2 VIEW COMPARISON:  08/14/2018 FINDINGS: The heart size and mediastinal contours are within normal limits. Both lungs are clear. The visualized skeletal  structures are unremarkable. IMPRESSION: No active cardiopulmonary disease. Electronically Signed   By: Alcide Clever M.D.   On: 10/19/2019 01:09   CT Head Wo Contrast  Result Date: 10/19/2019 CLINICAL DATA:  Generalized weakness EXAM: CT HEAD WITHOUT CONTRAST TECHNIQUE: Contiguous axial images were obtained from the base of the skull through the vertex without intravenous contrast. COMPARISON:  None. FINDINGS: Brain: No evidence of acute territorial infarction, hemorrhage, hydrocephalus,extra-axial  collection or mass lesion/mass effect. The ventricles are normal in size and contour. Low-attenuation changes in the deep white matter consistent with small vessel ischemia. Vascular: No hyperdense vessel or unexpected calcification. Skull: The skull is intact. No fracture or focal lesion identified. Sinuses/Orbits: The visualized paranasal sinuses and mastoid air cells are clear. The orbits and globes intact. Other: None IMPRESSION: No acute intracranial abnormality. Findings consistent with mild chronic small vessel ischemia Electronically Signed   By: Jonna Clark M.D.   On: 10/19/2019 00:10   MR BRAIN WO CONTRAST  Result Date: 10/19/2019 CLINICAL DATA:  Initial evaluation for acute generalized weakness, inability to stand with loss of grip bilaterally. EXAM: MRI HEAD WITHOUT CONTRAST MRI CERVICAL SPINE WITHOUT CONTRAST MRI THORACIC SPINE WITHOUT CONTRAST TECHNIQUE: Multiplanar, multiecho pulse sequences of the brain and surrounding structures, and cervical spine, to include the craniocervical junction and cervicothoracic junction, and thoracic spine were obtained without intravenous contrast. COMPARISON:  None available. FINDINGS: MRI HEAD FINDINGS Brain: Examination moderately degraded by motion artifact. Cerebral volume within normal limits for age. Patchy T2/FLAIR hyperintensity seen involving the periventricular, deep, and subcortical white matter both cerebral hemispheres, nonspecific, but overall moderate in appearance. No abnormal foci of restricted diffusion to suggest acute or subacute ischemia. Gray-white matter differentiation maintained. No encephalomalacia to suggest chronic cortical infarction. No foci of susceptibility artifact to suggest acute or chronic intracranial hemorrhage. No mass lesion, midline shift or mass effect. No hydrocephalus or extra-axial fluid collection. Pituitary gland suprasellar region normal. Midline structures intact. Vascular: Major intracranial vascular flow voids are  grossly maintained at the skull base. Skull and upper cervical spine: Craniocervical junction normal. Bone marrow signal intensity within normal limits. No scalp soft tissue abnormality. Sinuses/Orbits: Globes and orbital soft tissues grossly within normal limits. Paranasal sinuses are largely clear. Trace left mastoid effusion noted, of doubtful significance. Other: None. MRI CERVICAL SPINE FINDINGS Alignment: Examination markedly degraded by motion artifact. Straightening of the normal cervical lordosis. Trace anterolisthesis of C7 on T1. Vertebrae: Vertebral body height maintained without evidence for acute or chronic fracture. Bone marrow signal intensity within normal limits. No worrisome osseous lesions. No abnormal marrow edema. Cord: Abnormal cord signal intensity seen within the cervical spinal cord extending from C3 through C6-7 (series 16, image 7). Cord is somewhat swollen in appearance, suggesting a edema rather than myelomalacia. Finding favored to be secondary to disc herniation and cord compression at the C4-5 level. Otherwise, signal intensity within the cord is grossly within normal limits on this motion degraded exam. Posterior Fossa, vertebral arteries, paraspinal tissues: Craniocervical junction normal. Paraspinous and prevertebral soft tissues normal. Normal flow voids seen within the vertebral arteries bilaterally. Disc levels: C2-C3: Negative interspace. Left-sided facet degeneration. No spinal stenosis. Mild left C3 foraminal narrowing. No significant right foraminal encroachment. C3-C4: Central disc protrusion indents the ventral thecal sac. Superimposed left-sided facet degeneration with ligament flavum hypertrophy. Resultant mild to moderate spinal stenosis with mild cord flattening. Moderate to severe left C4 foraminal stenosis. No significant right foraminal narrowing. C4-C5: Large central disc protrusion with associated superior and inferior  migration of disc material (series 18, image  13). Protruding disc impinges upon the cervical spinal cord with secondary cord compression and cord signal changes. Thecal sac measures 3 mm in AP diameter at its most narrow point. Superimposed right-sided uncovertebral hypertrophy with suspected mild right C5 foraminal stenosis. No significant left foraminal narrowing. C5-C6: Chronic intervertebral disc space narrowing with diffuse degenerative disc osteophyte. Broad posterior component flattens and effaces the ventral thecal sac. Moderate to severe spinal stenosis. Thecal sac measures approximately 6 mm in AP diameter. Moderate to severe left worse than right C6 foraminal narrowing. C6-C7: Diffuse disc bulge with bilateral uncovertebral hypertrophy. Disc bulge eccentric to the right. Flattening of the ventral thecal sac with resultant mild-to-moderate spinal stenosis. Severe right with moderate left C7 foraminal narrowing. C7-T1: Trace anterolisthesis. Minimal disc bulge. Moderate bilateral facet hypertrophy. No spinal stenosis. Probable mild bilateral C8 foraminal narrowing. MRI THORACIC SPINE FINDINGS Alignment: Straightening of the normal midthoracic kyphosis. Trace anterolisthesis of T2 on T3 and T3 on T4. Vertebrae: Compression deformity involving the inferior endplate of T12 with mild 30% height loss without bony retropulsion. Minimal residual marrow edema, suggesting a late subacute to chronic compression fracture. Additional chronic compression deformities involving the superior endplates of T4, T9, and L1 noted. No acute compression fracture. Underlying bone marrow signal intensity within normal limits. Multiple scattered benign hemangiomata noted. No worrisome osseous lesions. No other abnormal marrow edema. Cord: Signal intensity within the thoracic spinal cord is grossly within normal limits. Posterior Fossa, vertebral arteries, paraspinal tissues: Paraspinous soft tissues within normal limits. Partially visualized lungs are grossly clear. 12 mm  simple cyst noted within the liver. Cholelithiasis noted. Few scattered T2 hyperintense simple renal cyst noted. There is a 12 mm T2 hypointense exophytic lesion extending from the posterior left kidney, indeterminate (series 25, image 52). Disc levels: T8-9: Small left paracentral disc protrusion mildly indents the left ventral thecal sac (series 25, image 35). Minimal flattening of the ventral cord without significant stenosis. No other significant disc pathology seen within the thoracic spine. Mild diffuse prominence of the dorsal epidural fat noted throughout the midthoracic spine. No other significant stenosis. IMPRESSION: MRI HEAD IMPRESSION: 1. Motion degraded exam. 2. No acute intracranial abnormality. 3. Scattered T2/FLAIR hyperintensity involving the supratentorial cerebral white matter, nonspecific, but most commonly related to chronic microvascular ischemic disease. Overall, appearance is moderate in nature. MRI CERVICAL SPINE IMPRESSION: 1. Motion degraded exam. 2. Large central disc protrusion at C4-5 with resultant severe spinal stenosis and cord compression. Cord signal changes extending from C3 through C6-7 concerning for edema. Emergent neuro surgical consultation recommended. 3. Additional cervical spondylosis at C3-4, C5-6, and C6-7. Additional moderate to severe spinal stenosis at C5-6, with mild to moderate canal narrowing at C3-4 and C6-7. Moderate to severe bilateral foraminal narrowing at C4, C6, and C7 as above. MRI THORACIC SPINE IMPRESSION: 1. No acute abnormality within the thoracic spine. Normal MRI appearance of the thoracic spinal cord. 2. Late subacute to chronic compression deformity involving the inferior endplate of T12 with mild 30% height loss without bony retropulsion. Additional mild chronic compression deformities of T4, T9, and L1. 3. Left paracentral disc protrusion at T8-9 without significant stenosis. 4. 12 mm T2 hypointense exophytic lesion extending from the posterior  left kidney, indeterminate. Further assessment with dedicated renal mass protocol CT and/or MRI recommended for further evaluation. This could be performed on a nonemergent outpatient basis. 5. Cholelithiasis. Critical Value/emergent results were called by telephone at the time of interpretation on 10/19/2019 at  7:43 am to provider Michela Pitcher , who verbally acknowledged these results. Electronically Signed   By: Rise Mu M.D.   On: 10/19/2019 07:48   MR Cervical Spine Wo Contrast  Result Date: 10/19/2019 CLINICAL DATA:  Initial evaluation for acute generalized weakness, inability to stand with loss of grip bilaterally. EXAM: MRI HEAD WITHOUT CONTRAST MRI CERVICAL SPINE WITHOUT CONTRAST MRI THORACIC SPINE WITHOUT CONTRAST TECHNIQUE: Multiplanar, multiecho pulse sequences of the brain and surrounding structures, and cervical spine, to include the craniocervical junction and cervicothoracic junction, and thoracic spine were obtained without intravenous contrast. COMPARISON:  None available. FINDINGS: MRI HEAD FINDINGS Brain: Examination moderately degraded by motion artifact. Cerebral volume within normal limits for age. Patchy T2/FLAIR hyperintensity seen involving the periventricular, deep, and subcortical white matter both cerebral hemispheres, nonspecific, but overall moderate in appearance. No abnormal foci of restricted diffusion to suggest acute or subacute ischemia. Gray-white matter differentiation maintained. No encephalomalacia to suggest chronic cortical infarction. No foci of susceptibility artifact to suggest acute or chronic intracranial hemorrhage. No mass lesion, midline shift or mass effect. No hydrocephalus or extra-axial fluid collection. Pituitary gland suprasellar region normal. Midline structures intact. Vascular: Major intracranial vascular flow voids are grossly maintained at the skull base. Skull and upper cervical spine: Craniocervical junction normal. Bone marrow signal  intensity within normal limits. No scalp soft tissue abnormality. Sinuses/Orbits: Globes and orbital soft tissues grossly within normal limits. Paranasal sinuses are largely clear. Trace left mastoid effusion noted, of doubtful significance. Other: None. MRI CERVICAL SPINE FINDINGS Alignment: Examination markedly degraded by motion artifact. Straightening of the normal cervical lordosis. Trace anterolisthesis of C7 on T1. Vertebrae: Vertebral body height maintained without evidence for acute or chronic fracture. Bone marrow signal intensity within normal limits. No worrisome osseous lesions. No abnormal marrow edema. Cord: Abnormal cord signal intensity seen within the cervical spinal cord extending from C3 through C6-7 (series 16, image 7). Cord is somewhat swollen in appearance, suggesting a edema rather than myelomalacia. Finding favored to be secondary to disc herniation and cord compression at the C4-5 level. Otherwise, signal intensity within the cord is grossly within normal limits on this motion degraded exam. Posterior Fossa, vertebral arteries, paraspinal tissues: Craniocervical junction normal. Paraspinous and prevertebral soft tissues normal. Normal flow voids seen within the vertebral arteries bilaterally. Disc levels: C2-C3: Negative interspace. Left-sided facet degeneration. No spinal stenosis. Mild left C3 foraminal narrowing. No significant right foraminal encroachment. C3-C4: Central disc protrusion indents the ventral thecal sac. Superimposed left-sided facet degeneration with ligament flavum hypertrophy. Resultant mild to moderate spinal stenosis with mild cord flattening. Moderate to severe left C4 foraminal stenosis. No significant right foraminal narrowing. C4-C5: Large central disc protrusion with associated superior and inferior migration of disc material (series 18, image 13). Protruding disc impinges upon the cervical spinal cord with secondary cord compression and cord signal changes.  Thecal sac measures 3 mm in AP diameter at its most narrow point. Superimposed right-sided uncovertebral hypertrophy with suspected mild right C5 foraminal stenosis. No significant left foraminal narrowing. C5-C6: Chronic intervertebral disc space narrowing with diffuse degenerative disc osteophyte. Broad posterior component flattens and effaces the ventral thecal sac. Moderate to severe spinal stenosis. Thecal sac measures approximately 6 mm in AP diameter. Moderate to severe left worse than right C6 foraminal narrowing. C6-C7: Diffuse disc bulge with bilateral uncovertebral hypertrophy. Disc bulge eccentric to the right. Flattening of the ventral thecal sac with resultant mild-to-moderate spinal stenosis. Severe right with moderate left C7 foraminal narrowing. C7-T1: Trace anterolisthesis.  Minimal disc bulge. Moderate bilateral facet hypertrophy. No spinal stenosis. Probable mild bilateral C8 foraminal narrowing. MRI THORACIC SPINE FINDINGS Alignment: Straightening of the normal midthoracic kyphosis. Trace anterolisthesis of T2 on T3 and T3 on T4. Vertebrae: Compression deformity involving the inferior endplate of T12 with mild 30% height loss without bony retropulsion. Minimal residual marrow edema, suggesting a late subacute to chronic compression fracture. Additional chronic compression deformities involving the superior endplates of T4, T9, and L1 noted. No acute compression fracture. Underlying bone marrow signal intensity within normal limits. Multiple scattered benign hemangiomata noted. No worrisome osseous lesions. No other abnormal marrow edema. Cord: Signal intensity within the thoracic spinal cord is grossly within normal limits. Posterior Fossa, vertebral arteries, paraspinal tissues: Paraspinous soft tissues within normal limits. Partially visualized lungs are grossly clear. 12 mm simple cyst noted within the liver. Cholelithiasis noted. Few scattered T2 hyperintense simple renal cyst noted. There is  a 12 mm T2 hypointense exophytic lesion extending from the posterior left kidney, indeterminate (series 25, image 52). Disc levels: T8-9: Small left paracentral disc protrusion mildly indents the left ventral thecal sac (series 25, image 35). Minimal flattening of the ventral cord without significant stenosis. No other significant disc pathology seen within the thoracic spine. Mild diffuse prominence of the dorsal epidural fat noted throughout the midthoracic spine. No other significant stenosis. IMPRESSION: MRI HEAD IMPRESSION: 1. Motion degraded exam. 2. No acute intracranial abnormality. 3. Scattered T2/FLAIR hyperintensity involving the supratentorial cerebral white matter, nonspecific, but most commonly related to chronic microvascular ischemic disease. Overall, appearance is moderate in nature. MRI CERVICAL SPINE IMPRESSION: 1. Motion degraded exam. 2. Large central disc protrusion at C4-5 with resultant severe spinal stenosis and cord compression. Cord signal changes extending from C3 through C6-7 concerning for edema. Emergent neuro surgical consultation recommended. 3. Additional cervical spondylosis at C3-4, C5-6, and C6-7. Additional moderate to severe spinal stenosis at C5-6, with mild to moderate canal narrowing at C3-4 and C6-7. Moderate to severe bilateral foraminal narrowing at C4, C6, and C7 as above. MRI THORACIC SPINE IMPRESSION: 1. No acute abnormality within the thoracic spine. Normal MRI appearance of the thoracic spinal cord. 2. Late subacute to chronic compression deformity involving the inferior endplate of T12 with mild 30% height loss without bony retropulsion. Additional mild chronic compression deformities of T4, T9, and L1. 3. Left paracentral disc protrusion at T8-9 without significant stenosis. 4. 12 mm T2 hypointense exophytic lesion extending from the posterior left kidney, indeterminate. Further assessment with dedicated renal mass protocol CT and/or MRI recommended for further  evaluation. This could be performed on a nonemergent outpatient basis. 5. Cholelithiasis. Critical Value/emergent results were called by telephone at the time of interpretation on 10/19/2019 at 7:43 am to provider Michela Pitcher , who verbally acknowledged these results. Electronically Signed   By: Rise Mu M.D.   On: 10/19/2019 07:48   MR THORACIC SPINE WO CONTRAST  Result Date: 10/19/2019 CLINICAL DATA:  Initial evaluation for acute generalized weakness, inability to stand with loss of grip bilaterally. EXAM: MRI HEAD WITHOUT CONTRAST MRI CERVICAL SPINE WITHOUT CONTRAST MRI THORACIC SPINE WITHOUT CONTRAST TECHNIQUE: Multiplanar, multiecho pulse sequences of the brain and surrounding structures, and cervical spine, to include the craniocervical junction and cervicothoracic junction, and thoracic spine were obtained without intravenous contrast. COMPARISON:  None available. FINDINGS: MRI HEAD FINDINGS Brain: Examination moderately degraded by motion artifact. Cerebral volume within normal limits for age. Patchy T2/FLAIR hyperintensity seen involving the periventricular, deep, and subcortical white matter both cerebral  hemispheres, nonspecific, but overall moderate in appearance. No abnormal foci of restricted diffusion to suggest acute or subacute ischemia. Gray-white matter differentiation maintained. No encephalomalacia to suggest chronic cortical infarction. No foci of susceptibility artifact to suggest acute or chronic intracranial hemorrhage. No mass lesion, midline shift or mass effect. No hydrocephalus or extra-axial fluid collection. Pituitary gland suprasellar region normal. Midline structures intact. Vascular: Major intracranial vascular flow voids are grossly maintained at the skull base. Skull and upper cervical spine: Craniocervical junction normal. Bone marrow signal intensity within normal limits. No scalp soft tissue abnormality. Sinuses/Orbits: Globes and orbital soft tissues grossly within  normal limits. Paranasal sinuses are largely clear. Trace left mastoid effusion noted, of doubtful significance. Other: None. MRI CERVICAL SPINE FINDINGS Alignment: Examination markedly degraded by motion artifact. Straightening of the normal cervical lordosis. Trace anterolisthesis of C7 on T1. Vertebrae: Vertebral body height maintained without evidence for acute or chronic fracture. Bone marrow signal intensity within normal limits. No worrisome osseous lesions. No abnormal marrow edema. Cord: Abnormal cord signal intensity seen within the cervical spinal cord extending from C3 through C6-7 (series 16, image 7). Cord is somewhat swollen in appearance, suggesting a edema rather than myelomalacia. Finding favored to be secondary to disc herniation and cord compression at the C4-5 level. Otherwise, signal intensity within the cord is grossly within normal limits on this motion degraded exam. Posterior Fossa, vertebral arteries, paraspinal tissues: Craniocervical junction normal. Paraspinous and prevertebral soft tissues normal. Normal flow voids seen within the vertebral arteries bilaterally. Disc levels: C2-C3: Negative interspace. Left-sided facet degeneration. No spinal stenosis. Mild left C3 foraminal narrowing. No significant right foraminal encroachment. C3-C4: Central disc protrusion indents the ventral thecal sac. Superimposed left-sided facet degeneration with ligament flavum hypertrophy. Resultant mild to moderate spinal stenosis with mild cord flattening. Moderate to severe left C4 foraminal stenosis. No significant right foraminal narrowing. C4-C5: Large central disc protrusion with associated superior and inferior migration of disc material (series 18, image 13). Protruding disc impinges upon the cervical spinal cord with secondary cord compression and cord signal changes. Thecal sac measures 3 mm in AP diameter at its most narrow point. Superimposed right-sided uncovertebral hypertrophy with suspected  mild right C5 foraminal stenosis. No significant left foraminal narrowing. C5-C6: Chronic intervertebral disc space narrowing with diffuse degenerative disc osteophyte. Broad posterior component flattens and effaces the ventral thecal sac. Moderate to severe spinal stenosis. Thecal sac measures approximately 6 mm in AP diameter. Moderate to severe left worse than right C6 foraminal narrowing. C6-C7: Diffuse disc bulge with bilateral uncovertebral hypertrophy. Disc bulge eccentric to the right. Flattening of the ventral thecal sac with resultant mild-to-moderate spinal stenosis. Severe right with moderate left C7 foraminal narrowing. C7-T1: Trace anterolisthesis. Minimal disc bulge. Moderate bilateral facet hypertrophy. No spinal stenosis. Probable mild bilateral C8 foraminal narrowing. MRI THORACIC SPINE FINDINGS Alignment: Straightening of the normal midthoracic kyphosis. Trace anterolisthesis of T2 on T3 and T3 on T4. Vertebrae: Compression deformity involving the inferior endplate of T12 with mild 30% height loss without bony retropulsion. Minimal residual marrow edema, suggesting a late subacute to chronic compression fracture. Additional chronic compression deformities involving the superior endplates of T4, T9, and L1 noted. No acute compression fracture. Underlying bone marrow signal intensity within normal limits. Multiple scattered benign hemangiomata noted. No worrisome osseous lesions. No other abnormal marrow edema. Cord: Signal intensity within the thoracic spinal cord is grossly within normal limits. Posterior Fossa, vertebral arteries, paraspinal tissues: Paraspinous soft tissues within normal limits. Partially visualized lungs are  grossly clear. 12 mm simple cyst noted within the liver. Cholelithiasis noted. Few scattered T2 hyperintense simple renal cyst noted. There is a 12 mm T2 hypointense exophytic lesion extending from the posterior left kidney, indeterminate (series 25, image 52). Disc levels:  T8-9: Small left paracentral disc protrusion mildly indents the left ventral thecal sac (series 25, image 35). Minimal flattening of the ventral cord without significant stenosis. No other significant disc pathology seen within the thoracic spine. Mild diffuse prominence of the dorsal epidural fat noted throughout the midthoracic spine. No other significant stenosis. IMPRESSION: MRI HEAD IMPRESSION: 1. Motion degraded exam. 2. No acute intracranial abnormality. 3. Scattered T2/FLAIR hyperintensity involving the supratentorial cerebral white matter, nonspecific, but most commonly related to chronic microvascular ischemic disease. Overall, appearance is moderate in nature. MRI CERVICAL SPINE IMPRESSION: 1. Motion degraded exam. 2. Large central disc protrusion at C4-5 with resultant severe spinal stenosis and cord compression. Cord signal changes extending from C3 through C6-7 concerning for edema. Emergent neuro surgical consultation recommended. 3. Additional cervical spondylosis at C3-4, C5-6, and C6-7. Additional moderate to severe spinal stenosis at C5-6, with mild to moderate canal narrowing at C3-4 and C6-7. Moderate to severe bilateral foraminal narrowing at C4, C6, and C7 as above. MRI THORACIC SPINE IMPRESSION: 1. No acute abnormality within the thoracic spine. Normal MRI appearance of the thoracic spinal cord. 2. Late subacute to chronic compression deformity involving the inferior endplate of T12 with mild 30% height loss without bony retropulsion. Additional mild chronic compression deformities of T4, T9, and L1. 3. Left paracentral disc protrusion at T8-9 without significant stenosis. 4. 12 mm T2 hypointense exophytic lesion extending from the posterior left kidney, indeterminate. Further assessment with dedicated renal mass protocol CT and/or MRI recommended for further evaluation. This could be performed on a nonemergent outpatient basis. 5. Cholelithiasis. Critical Value/emergent results were called  by telephone at the time of interpretation on 10/19/2019 at 7:43 am to provider Michela PitcherMina Fawze , who verbally acknowledged these results. Electronically Signed   By: Rise MuBenjamin  McClintock M.D.   On: 10/19/2019 07:48   CT Angio Chest/Abd/Pel for Dissection W and/or W/WO  Result Date: 10/19/2019 CLINICAL DATA:  Chest and back pain. Clinical suspicion for aortic dissection. EXAM: CT ANGIOGRAPHY CHEST, ABDOMEN AND PELVIS TECHNIQUE: Non-contrast CT of the chest was initially obtained. Multidetector CT imaging through the chest, abdomen and pelvis was performed using the standard protocol during bolus administration of intravenous contrast. Multiplanar reconstructed images and MIPs were obtained and reviewed to evaluate the vascular anatomy. CONTRAST:  100mL OMNIPAQUE IOHEXOL 350 MG/ML SOLN COMPARISON:  02/06/2018 FINDINGS: CTA CHEST FINDINGS Cardiovascular: Thoracic aorta is normal in caliber. No dissection. Minimal atherosclerosis along the descending portion. Arch branch vessels are widely patent. Heart is normal in size. No pericardial effusion. Mild three-vessel coronary artery calcifications. Pulmonary arteries are well opacified. No evidence of a pulmonary embolism. Mediastinum/Nodes: No enlarged mediastinal, hilar, or axillary lymph nodes. Thyroid gland, trachea, and esophagus demonstrate no significant findings. Lungs/Pleura: Mild centrilobular emphysema. Minor atelectasis in the posterior lung bases. Lungs otherwise clear. No pleural effusion or pneumothorax. Musculoskeletal: Mild compression fracture of T12. This is new compared to radiographs from 05/12/2008. It is not visualized on the CT lung cancer screening exam from 02/06/2018. There is a minor depression of the upper endplate of T4 which is chronic. No other fractures. No osteoblastic or osteolytic lesions. Review of the MIP images confirms the above findings. CTA ABDOMEN AND PELVIS FINDINGS VASCULAR Aorta: Atherosclerotic plaque mostly along the  infrarenal  abdominal aorta. No significant stenosis. No dissection and no aneurysm. Celiac: Patent without evidence of aneurysm, dissection, vasculitis or significant stenosis. SMA: Patent without evidence of aneurysm, dissection, vasculitis or significant stenosis. Renals: Atherosclerotic plaque noted just beyond the origins of both renal arteries, both vessels narrowed by 50%. IMA: Atherosclerotic plaque at the origin. Vessel these mildly narrowed, but patent. Inflow: Atherosclerotic plaque along the common, internal and external iliac arteries. No significant stenosis. Veins: No obvious venous abnormality within the limitations of this arterial phase study. Review of the MIP images confirms the above findings. NON-VASCULAR Hepatobiliary: Stable low-density lesion at the dome adjacent to the intrahepatic inferior vena cava, 11 mm in size, consistent with a cyst. Liver normal in size and overall attenuation. No other masses or lesions. Dependent gallstones. No evidence of acute cholecystitis. No bile duct dilation. Pancreas: Unremarkable. No pancreatic ductal dilatation or surrounding inflammatory changes. Spleen: Normal in size without focal abnormality. Adrenals/Urinary Tract: Kidneys normal in size, orientation and position. Four low-density left renal masses, largest from the anterior midpole, 3 cm, all consistent with cysts. No other masses, no stones and no hydronephrosis. Ureters normal in course and in caliber. Bladder decompressed with a Foley catheter. Stomach/Bowel: Stomach is within normal limits. Appendix appears normal. No evidence of bowel wall thickening, distention, or inflammatory changes. Lymphatic: No enlarged lymph nodes. Reproductive: Uterus and bilateral adnexa are unremarkable. Other: No abdominal wall hernia or abnormality. No abdominopelvic ascites. Musculoskeletal: Mild depression of the upper endplate of L3 with associated Schmorl's node, which appears chronic. Compression fracture of T12 as  detailed under the chest section. No other fractures. No osteoblastic or osteolytic lesions. Previous left proximal femur fracture reduced with a compression screw and intramedullary rod. Review of the MIP images confirms the above findings. IMPRESSION: CTA FINDINGS 1. No thoracoabdominal aortic dissection.  No aneurysm. 2. Minor atherosclerotic plaque along the descending thoracic aorta with more notable plaque along the abdominal aorta. No significant stenosis. 3. Mild, proximally 50%, atherosclerotic narrowing of the renal arteries. No other branch vessel narrowing. NON CTA FINDINGS 1. No acute findings within the chest, abdomen or pelvis. 2. Mild centrilobular emphysema. Mild three-vessel coronary artery calcifications. 3. Left renal cysts.  Gallstones. Electronically Signed   By: Amie Portland M.D.   On: 10/19/2019 04:29    Pertinent items noted in HPI and remainder of comprehensive ROS otherwise negative.  Blood pressure (!) 135/85, pulse 57, temperature 99.2 F (37.3 C), temperature source Oral, resp. rate 15, height 5\' 2"  (1.575 m), weight 63.5 kg, SpO2 98 %.  Patient is awake and alert.  She is oriented and appropriate.  Her speech is fluent.  Her judgment and insight appear to be intact.  Cranial nerve function normal bilateral.  Motor examination of the extremities reveals 4+/5 deltoid and biceps muscle group strength.  She has 3/5 right wrist extensor and 2/5 left wrist extensors she has 3/5 right triceps muscle group function and 1/5 left triceps muscle group function.  She has absent grips and intrinsics function bilaterally.  She has just barely antigravity strength in her right lower extremity and does not have antigravity strength but has some muscular contraction and movement in her left lower extremity.  She has dense sensory loss from C5 distally.  She is hyperreflexic.  She has Hoffmann's responses in both hands.  Examination head ears eyes nose and throat is unremarkable her chest and  abdomen are benign. Assessment/Plan Severe cervical myelopathy with incomplete spinal cord injury secondary to large  central disc herniation at C4-5 and significant stenosis at C5-6.  I discussed situation with the patient.  I recommended that we move forward with emergent C4-5 and C5-6 anterior cervical discectomy with interbody fusion utilizing interbody cages, local harvested autograft, and anterior plate instrumentation.  I discussed the risks and benefits involved with surgery including not limited to risk of anesthesia, bleeding, infection, CSF leak, nerve root injury, spinal cord injury, fusion failure, stage III, dysphagia, dysphonia, continued pain, and nonbenefit.  Patient does have history of a deep venous thrombosis which is being treated with Eliquis.  She reports that she has not taken her Eliquis over the last 2days.  We will monitor for signs of significant bleeding intraoperatively but unfortunately I think the surgery has to get done either way.  Didier Brandenburg A Autry Droege 10/19/2019, 10:02 AM

## 2019-10-19 NOTE — ED Notes (Signed)
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

## 2019-10-19 NOTE — ED Notes (Signed)
Informed consent was filled out per Doctors order & pt gave verbal consent to this RN to write her name d/t her inability to hold a pen. This RN had Shyneice RN witness her verbal consent as well, so that there is no misconception of who wrote her name on the procedural consent form.

## 2019-10-19 NOTE — Op Note (Signed)
Date of procedure: 10/19/2019  Date of dictation: Same  Service: Neurosurgery  Preoperative diagnosis: Cervical stenosis with myelopathy  Postoperative diagnosis: Same  Procedure Name: C4-5, C5-6 anterior cervical discectomy with interbody fusion utilizing interbody cage, locally harvested autograft, and anterior plate instrumentation  Surgeon:Annlee Glandon A.Tkai Serfass, M.D.  Asst. Surgeon: Doran Durand, NP  Anesthesia: General  Indication: 64 year old female awake and approximately 36 hours ago with severe neck pain with bilateral upper extremity numbness and weakness.  Work-up is demonstrated evidence of critical cervical stenosis with marked cervical cord signal abnormality secondary to a large central disc herniation at C4-5 and contributed to by significant multifactorial stenosis at C5-6.  Patient presents now for emergent two-level anterior cervical decompression and fusion in hopes of improving her symptoms.  Operative note: After induction of anesthesia, patient positioned supine with neck slightly extended and held placed halter traction.  Patient's anterior cervical region prepped draped sterilely.  Incision made overlying C5.  Dissection performed on the right.  Retractor placed.  Fluoroscopy used.  Levels confirmed.  The space was then incised at both levels.  Discectomy then performed using various instruments down to level the posterior annulus.  Microscope then brought to the field used throughout the remainder of the discectomy.  Remaining aspects of annulus and osteophytes removed using high-speed drill.  The posterior logical ligament at the C4-5 level was completely disrupted.  There was a large amount of central disc which was dissected free and removed in several large fragments.  The underlying thecal sac was then identified.  A wide central decompression was then performed undercutting the bodies of C4 and C5.  Decompression then proceeded into each neural foramina.  Wide anterior  foraminotomies were performed on the course exiting C5 nerve roots bilaterally.  At this point a very thorough decompression had been achieved without any evidence of injury to thecal sac or nerve roots or spinal cord.  Procedures then repeated at C5-6 this time with the findings being mostly spondylitic compression.  Again no complications of this level.  Wound is then irrigated with antibiotic solution.  Gelfoam was placed topically then removed.  [employer] anatomic peek cages were packed with locally harvested autograft.  Each cage was then impacted into place and recessed slightly from the anterior cortical margins of the vertebral bodies.  Atlantis anterior cervical plate was then placed over the C4, C5 and C6 levels.  This then attached under fluoroscopic guidance using 13 mm variable angle screws to each at all 3 levels.  All screws given final tightening found to be solidly within the bone.  Locking screws engaged all levels.  Final images reveal good position of the cages and the hardware at the proper operative levels with normal alignment of the spine.  Wound is inspected for hemostasis which is found to be good.  Wound is then closed in layers with Vicryl sutures.  Steri-Strips and sterile dressing were applied.  Patient tolerated the procedure well and she returns to the recovery room postop.

## 2019-10-19 NOTE — ED Notes (Signed)
Rn notified that pt could not stay still in MRI d/t pain. RN notified ED PA. RN given fentanyl to pt in MRI.

## 2019-10-19 NOTE — Anesthesia Postprocedure Evaluation (Signed)
Anesthesia Post Note  Patient: Cynthia Hobbs  Procedure(s) Performed: Cervical four-five, Cervical five-six Anterior Cervical Discectomy with Interbody Fusion utilizing Interbody Cages, local harvested autograft and anterior plate instrumentation. (N/A Neck)     Patient location during evaluation: PACU Anesthesia Type: General Level of consciousness: awake and alert Pain management: pain level controlled Vital Signs Assessment: post-procedure vital signs reviewed and stable Respiratory status: spontaneous breathing, nonlabored ventilation, respiratory function stable and patient connected to nasal cannula oxygen Cardiovascular status: blood pressure returned to baseline and stable Postop Assessment: no apparent nausea or vomiting Anesthetic complications: no   No complications documented.  Last Vitals:  Vitals:   10/19/19 1455 10/19/19 1500  BP: 108/69   Pulse: 60   Resp: 15   Temp: 36.7 C 36.6 C  SpO2: 96%     Last Pain:  Vitals:   10/19/19 1455  TempSrc:   PainSc: Asleep                 Chaniya Genter P Basma Buchner

## 2019-10-19 NOTE — Progress Notes (Signed)
Orthopedic Tech Progress Note Patient Details:  Cynthia Hobbs 1956/02/05 [id number]  Ortho Devices Type of Ortho Device: Soft collar Ortho Device/Splint Interventions: Application, Ordered   Post Interventions Patient Tolerated: Well Instructions Provided: Adjustment of device, Care of device   Chantea Surace P Harle Stanford 10/19/2019, 2:47 PM

## 2019-10-20 ENCOUNTER — Encounter (HOSPITAL_COMMUNITY): Payer: Self-pay | Admitting: Neurosurgery

## 2019-10-20 LAB — URINE CULTURE: Culture: NO GROWTH

## 2019-10-20 MED ORDER — BACLOFEN 10 MG PO TABS
10.0000 mg | ORAL_TABLET | Freq: Three times a day (TID) | ORAL | Status: DC
  Administered 2019-10-20 – 2019-10-21 (×3): 10 mg via ORAL
  Filled 2019-10-20 (×3): qty 1

## 2019-10-20 MED ORDER — DIAZEPAM 5 MG PO TABS
10.0000 mg | ORAL_TABLET | Freq: Once | ORAL | Status: AC
  Administered 2019-10-20: 10 mg via ORAL
  Filled 2019-10-20: qty 2

## 2019-10-20 MED FILL — Thrombin For Soln 5000 Unit: CUTANEOUS | Qty: 5000 | Status: AC

## 2019-10-20 NOTE — Progress Notes (Signed)
IP rehab admissions:  Received consult order for CIR.  Noted PT/OT recommending outpatient therapy for follow up.  Patient did well on evaluations and has been set up with outpatient therapy for follow up.  She will not need a CIR admission.  Call me for questions.  [phone]

## 2019-10-20 NOTE — TOC Initial Note (Signed)
Transition of Care [facility]) - Initial/Assessment Note    Patient Details  Name: Cynthia Hobbs MRN: [id number] Date of Birth: 1955/07/27  Transition of Care [facility]) CM/SW Contact:    Curlene Labrum, RN Phone Number: 10/20/2019, 3:05 PM  Clinical Narrative:                 Case management met with the patient regarding transitions of care S/P repair of cervical disc herniation C4-5, 5-6.  The patient complains of pain and bilateral leg cramps - called and spoke with Angelito to give patient needed pain medication.  The patient states that she lives with family and has transportation to receive outpatient services for PT/OT.  Referral placed for outpatient PT/OT at [facility].  Will continue to follow for discharge needs.  Expected Discharge Plan: OP Rehab Barriers to Discharge: Continued Medical Work up   Patient Goals and CMS Choice Patient states their goals for this hospitalization and ongoing recovery are:: Plans to discharge home with family and outpatient rehab CMS Medicare.gov Compare Post Acute Care list provided to:: Patient Choice offered to / list presented to : Patient  Expected Discharge Plan and Services Expected Discharge Plan: OP Rehab   Discharge Planning Services: CM Consult   Living arrangements for the past 2 months: Single Family Home                                      Prior Living Arrangements/Services Living arrangements for the past 2 months: Single Family Home Lives with:: Relatives Patient language and need for interpreter reviewed:: Yes Do you feel safe going back to the place where you live?: Yes      Need for Family Participation in Patient Care: Yes (Comment) Care giver support system in place?: Yes (comment)   Criminal Activity/Legal Involvement Pertinent to Current Situation/Hospitalization: No - Comment as needed  Activities of Daily Living Home Assistive Devices/Equipment: None ADL Screening (condition at time of  admission) Patient's cognitive ability adequate to safely complete daily activities?: Yes Is the patient deaf or have difficulty hearing?: No Does the patient have difficulty seeing, even when wearing glasses/contacts?: No Does the patient have difficulty concentrating, remembering, or making decisions?: No Patient able to express need for assistance with ADLs?: Yes Does the patient have difficulty dressing or bathing?: Yes Independently performs ADLs?: No Communication: Independent Dressing (OT): Needs assistance Is this a change from baseline?: Change from baseline, expected to last <3days Grooming: Independent Feeding: Independent Bathing: Needs assistance Is this a change from baseline?: Change from baseline, expected to last <3 days Toileting: Needs assistance Is this a change from baseline?: Change from baseline, expected to last <3 days In/Out Bed: Needs assistance Is this a change from baseline?: Change from baseline, expected to last <3 days Walks in Home: Needs assistance Is this a change from baseline?: Change from baseline, expected to last <3 days Does the patient have difficulty walking or climbing stairs?: Yes Weakness of Legs: Both Weakness of Arms/Hands: Both  Permission Sought/Granted Permission sought to share information with : Case Manager Permission granted to share information with : Yes, Verbal Permission Granted        Permission granted to share info w Relationship: family     Emotional Assessment Appearance:: Appears stated age Attitude/Demeanor/Rapport: Apprehensive, Complaining Affect (typically observed): Restless Orientation: : Oriented to Self, Oriented to Place, Oriented to  Time, Oriented to Situation Alcohol /  Substance Use: Tobacco Use Psych Involvement: No (comment)  Admission diagnosis:  Quadriparesis ([place]) [G82.50] Chronic anticoagulation [Z79.01] Cervical cord compression with myelopathy ([place]) [G95.20] Cervical myelopathy ([place])  [G95.9] Patient Active Problem List   Diagnosis Date Noted  . Cervical myelopathy ([place]) 10/19/2019  . PAD (peripheral artery disease) ([place]) 08/14/2018  . Calcaneus fracture, left 08/14/2018  . Closed fracture of left proximal humerus 08/14/2018  . Nondisplaced intertrochanteric fracture of left femur, initial encounter for closed fracture ([place]) 08/14/2018  . Coronary artery calcification seen on CAT scan 03/06/2018  . Hyperlipidemia 03/06/2018  . Tobacco abuse 03/06/2018  . Claudication in peripheral vascular disease ([place]) 03/06/2018  . Essential hypertension 03/06/2018   PCP:  Deland Pretty, MD Pharmacy:   [facility] [address] [place]), [place] [address] [place] ([place] [address] Phone: [phone] Fax: [phone]  [facility] [place], [place] - [address] [place] [address] Phone: [phone] Fax: [phone]     Social Determinants of Health (SDOH) Interventions    Readmission Risk Interventions Readmission Risk Prevention Plan 10/20/2019  Post Dischage Appt Complete  Medication Screening Complete  Transportation Screening Complete  Some recent data might be hidden

## 2019-10-20 NOTE — Plan of Care (Signed)
  Problem: Clinical Measurements: Goal: Ability to maintain clinical measurements within normal limits will improve Outcome: Progressing Goal: Will remain free from infection Outcome: Progressing   Problem: Elimination: Goal: Will not experience complications related to bowel motility Outcome: Progressing   Problem: Pain Managment: Goal: General experience of comfort will improve Outcome: Progressing   Problem: Safety: Goal: Ability to remain free from injury will improve Outcome: Progressing   

## 2019-10-20 NOTE — Evaluation (Signed)
Occupational Therapy Evaluation Patient Details Name: Cynthia Hobbs MRN: [id number] DOB: 09/03/1955 Today's Date: 10/20/2019    History of Present Illness Pt is a 64 year old woman admitted with severe neck pain and weakness. MRI+ for severe disc herniation with cord compression, stenosis and spondylosis. Underwent C 4-5, 5-6 ACDF on day of admission. PMH: smoker, CAD, PVD, DVT, HTN, narcotic dependence.   Clinical Impression   Pt was independent prior to admission. Presents with B UE weakness and incoordination and impaired balance. She reports intact sensation. Pt requires min assist for ambulation (hand held assist) and up to moderate assist for ADL. Will follow acutely, recommending OPOT    Follow Up Recommendations  Outpatient OT    Equipment Recommendations  None recommended by OT    Recommendations for Other Services       Precautions / Restrictions Precautions Precautions: Fall;Cervical Precaution Booklet Issued: No Precaution Comments: verbally educated in cervical precautions Required Braces or Orthoses: Cervical Brace Cervical Brace: Soft collar;At all times Restrictions Weight Bearing Restrictions: No      Mobility Bed Mobility Overal bed mobility: Needs Assistance Bed Mobility: Rolling;Sidelying to Sit Rolling: Min guard Sidelying to sit: Min guard       General bed mobility comments: educated in log roll technique  Transfers Overall transfer level: Needs assistance   Transfers: Sit to/from Stand Sit to Stand: Min assist;+2 safety/equipment         General transfer comment: assist to rise and steady    Balance Overall balance assessment: Needs assistance   Sitting balance-Leahy Scale: Fair       Standing balance-Leahy Scale: Poor Standing balance comment: reliant on one hand assist to steady                           ADL either performed or assessed with clinical judgement   ADL Overall ADL's : Needs  assistance/impaired Eating/Feeding: Minimal assistance;Sitting Eating/Feeding Details (indicate cue type and reason): educated in use and provided foam build ups for utensils Grooming: Wash/dry hands;Wash/dry face;Sitting;Set up   Upper Body Bathing: Minimal assistance;Sitting   Lower Body Bathing: Moderate assistance;Sit to/from stand   Upper Body Dressing : Minimal assistance;Sitting   Lower Body Dressing: Moderate assistance;Sit to/from stand   Toilet Transfer: Minimal assistance;Ambulation;Comfort height toilet;Grab bars           Functional mobility during ADLs: Minimal assistance       Vision Baseline Vision/History: Wears glasses Wears Glasses: Reading only Patient Visual Report: No change from baseline       Perception     Praxis      Pertinent Vitals/Pain Pain Assessment: 0-10 Pain Score: 7  Pain Location: shoulders, LEs Pain Descriptors / Indicators: Aching;Spasm Pain Intervention(s): Limited activity within patient's tolerance     Hand Dominance Right   Extremity/Trunk Assessment Upper Extremity Assessment Upper Extremity Assessment: Generalized weakness;RUE deficits/detail;LUE deficits/detail RUE Coordination: decreased fine motor;decreased gross motor LUE Coordination: decreased fine motor;decreased gross motor   Lower Extremity Assessment Lower Extremity Assessment: Defer to PT evaluation   Cervical / Trunk Assessment Cervical / Trunk Assessment: Other exceptions Cervical / Trunk Exceptions: s/p ACDF   Communication Communication Communication: No difficulties   Cognition Arousal/Alertness: Awake/alert Behavior During Therapy: Impulsive Overall Cognitive Status: Within Functional Limits for tasks assessed  General Comments       Exercises     Shoulder Instructions      Home Living Family/patient expects to be discharged to:: Private residence Living Arrangements: Parent (mom has  dementia) Available Help at Discharge: Family;Available 24 hours/day Type of Home: Mobile home Home Access: Ramped entrance     Home Layout: One level     Bathroom Shower/Tub: Chief Strategy Officer: Standard     Home Equipment: Cane - single point;Walker - 2 wheels;Walker - standard;Wheelchair - manual;Bedside commode          Prior Functioning/Environment Level of Independence: Independent                 OT Problem List: Decreased strength;Impaired balance (sitting and/or standing);Decreased coordination;Decreased knowledge of use of DME or AE;Pain;Impaired UE functional use      OT Treatment/Interventions: Self-care/ADL training;Neuromuscular education;DME and/or AE instruction;Therapeutic activities;Patient/family education;Balance training    OT Goals(Current goals can be found in the care plan section) Acute Rehab OT Goals Patient Stated Goal: return home OT Goal Formulation: With patient Time For Goal Achievement: 11/03/19 Potential to Achieve Goals: Good ADL Goals Pt Will Perform Eating: with modified independence;sitting;with adaptive utensils Pt Will Perform Grooming: with supervision;standing Pt Will Perform Upper Body Bathing: with min assist;sitting Pt Will Perform Lower Body Bathing: with min assist;sit to/from stand Pt Will Perform Upper Body Dressing: with set-up;sitting Pt Will Perform Lower Body Dressing: with min assist;sit to/from stand Pt Will Transfer to Toilet: with supervision;ambulating Pt Will Perform Toileting - Clothing Manipulation and hygiene: with supervision;sit to/from stand Additional ADL Goal #1: Pt will generalize cervical precautions during ADL and mobility.  OT Frequency: Min 2X/week   Barriers to D/C:            Co-evaluation              AM-PAC OT "6 Clicks" Daily Activity     Outcome Measure Help from another person eating meals?: A Little Help from another person taking care of personal grooming?:  A Lot Help from another person toileting, which includes using toliet, bedpan, or urinal?: A Little Help from another person bathing (including washing, rinsing, drying)?: A Lot Help from another person to put on and taking off regular upper body clothing?: A Little Help from another person to put on and taking off regular lower body clothing?: A Lot 6 Click Score: 15   End of Session Equipment Utilized During Treatment: Gait belt;Cervical collar Nurse Communication: Patient requests pain meds  Activity Tolerance: Patient tolerated treatment well Patient left: in chair;with call bell/phone within reach;with chair alarm set  OT Visit Diagnosis: Other abnormalities of gait and mobility (R26.89);Unsteadiness on feet (R26.81);Pain;Muscle weakness (generalized) (M62.81)                Time: 1696-7893 OT Time Calculation (min): 34 min Charges:  OT General Charges $OT Visit: 1 Visit OT Evaluation $OT Eval Moderate Complexity: 1 Mod  Martie Round, OTR/L Acute Rehabilitation Services Pager: [phone] Office: [phone]  Evern Bio 10/20/2019, 10:07 AM

## 2019-10-20 NOTE — Progress Notes (Signed)
Providing Compassionate, Quality Care - Together   Subjective: Patient reports improvement in strength, but significant muscle spasms/cramps in her lower extremities. She is able to lift her legs off the bed and ambulate. She still has significant numbness and tingling in her hands. Strength is improving in her upper extremities. She still has difficulty with the dexterity of her hands. She is still not able to use her cell phone or hold utensils without modifications.  Objective: Vital signs in last 24 hours: Temp:  [97.8 F (36.6 C)-99.6 F (37.6 C)] 99.6 F (37.6 C) (08/02 0316) Pulse Rate:  [58-81] 81 (08/02 0316) Resp:  [14-19] 18 (08/02 0316) BP: (98-115)/(57-87) 113/64 (08/02 0316) SpO2:  [90 %-100 %] 90 % (08/02 0316)  Intake/Output from previous day: 08/01 0701 - 08/02 0700 In: 1395.7 [I.V.:1257.5; IV Piggyback:138.2] Out: 1300 [Urine:1050; Blood:250] Intake/Output this shift: No intake/output data recorded.  Alert and oriented x 4 PERRLA CN II-XII grossly intact MAE, Strength and sensation improving Incision is covered with gauze dressing and Steri Strips; Dressing is clean, dry, and intact Patient is donning soft collar  Lab Results: Recent Labs    10/19/19 0023  WBC 7.9  HGB 13.9  HCT 45.9  PLT 214   BMET Recent Labs    10/19/19 0100  NA 139  K 3.9  CL 97*  CO2 28  GLUCOSE 107*  BUN 16  CREATININE 0.67  CALCIUM 10.0    Studies/Results: DG Chest 2 View  Result Date: 10/19/2019 CLINICAL DATA:  Weakness EXAM: CHEST - 2 VIEW COMPARISON:  08/14/2018 FINDINGS: The heart size and mediastinal contours are within normal limits. Both lungs are clear. The visualized skeletal structures are unremarkable. IMPRESSION: No active cardiopulmonary disease. Electronically Signed   By: Alcide Clever M.D.   On: 10/19/2019 01:09   DG Cervical Spine 2-3 Views  Result Date: 10/19/2019 CLINICAL DATA:  Elective surgery.  ACDF. EXAM: CERVICAL SPINE - 2-3 VIEW; DG C-ARM  1-60 MIN COMPARISON:  Preoperative MRI earlier today. FINDINGS: Single lateral fluoroscopic spot view obtained in the operating room. Anterior fusion with interbody spacers at C4-C5 and C5-C6. Fluoroscopy time 5 seconds. Dose 0.56 mGy. IMPRESSION: Intraoperative fluoroscopy after C4-C5 and C5-C6 anterior fusion. Electronically Signed   By: Narda Rutherford M.D.   On: 10/19/2019 15:36   CT Head Wo Contrast  Result Date: 10/19/2019 CLINICAL DATA:  Generalized weakness EXAM: CT HEAD WITHOUT CONTRAST TECHNIQUE: Contiguous axial images were obtained from the base of the skull through the vertex without intravenous contrast. COMPARISON:  None. FINDINGS: Brain: No evidence of acute territorial infarction, hemorrhage, hydrocephalus,extra-axial collection or mass lesion/mass effect. The ventricles are normal in size and contour. Low-attenuation changes in the deep white matter consistent with small vessel ischemia. Vascular: No hyperdense vessel or unexpected calcification. Skull: The skull is intact. No fracture or focal lesion identified. Sinuses/Orbits: The visualized paranasal sinuses and mastoid air cells are clear. The orbits and globes intact. Other: None IMPRESSION: No acute intracranial abnormality. Findings consistent with mild chronic small vessel ischemia Electronically Signed   By: Jonna Clark M.D.   On: 10/19/2019 00:10   MR BRAIN WO CONTRAST  Result Date: 10/19/2019 CLINICAL DATA:  Initial evaluation for acute generalized weakness, inability to stand with loss of grip bilaterally. EXAM: MRI HEAD WITHOUT CONTRAST MRI CERVICAL SPINE WITHOUT CONTRAST MRI THORACIC SPINE WITHOUT CONTRAST TECHNIQUE: Multiplanar, multiecho pulse sequences of the brain and surrounding structures, and cervical spine, to include the craniocervical junction and cervicothoracic junction, and thoracic spine  were obtained without intravenous contrast. COMPARISON:  None available. FINDINGS: MRI HEAD FINDINGS Brain: Examination  moderately degraded by motion artifact. Cerebral volume within normal limits for age. Patchy T2/FLAIR hyperintensity seen involving the periventricular, deep, and subcortical white matter both cerebral hemispheres, nonspecific, but overall moderate in appearance. No abnormal foci of restricted diffusion to suggest acute or subacute ischemia. Gray-white matter differentiation maintained. No encephalomalacia to suggest chronic cortical infarction. No foci of susceptibility artifact to suggest acute or chronic intracranial hemorrhage. No mass lesion, midline shift or mass effect. No hydrocephalus or extra-axial fluid collection. Pituitary gland suprasellar region normal. Midline structures intact. Vascular: Major intracranial vascular flow voids are grossly maintained at the skull base. Skull and upper cervical spine: Craniocervical junction normal. Bone marrow signal intensity within normal limits. No scalp soft tissue abnormality. Sinuses/Orbits: Globes and orbital soft tissues grossly within normal limits. Paranasal sinuses are largely clear. Trace left mastoid effusion noted, of doubtful significance. Other: None. MRI CERVICAL SPINE FINDINGS Alignment: Examination markedly degraded by motion artifact. Straightening of the normal cervical lordosis. Trace anterolisthesis of C7 on T1. Vertebrae: Vertebral body height maintained without evidence for acute or chronic fracture. Bone marrow signal intensity within normal limits. No worrisome osseous lesions. No abnormal marrow edema. Cord: Abnormal cord signal intensity seen within the cervical spinal cord extending from C3 through C6-7 (series 16, image 7). Cord is somewhat swollen in appearance, suggesting a edema rather than myelomalacia. Finding favored to be secondary to disc herniation and cord compression at the C4-5 level. Otherwise, signal intensity within the cord is grossly within normal limits on this motion degraded exam. Posterior Fossa, vertebral arteries,  paraspinal tissues: Craniocervical junction normal. Paraspinous and prevertebral soft tissues normal. Normal flow voids seen within the vertebral arteries bilaterally. Disc levels: C2-C3: Negative interspace. Left-sided facet degeneration. No spinal stenosis. Mild left C3 foraminal narrowing. No significant right foraminal encroachment. C3-C4: Central disc protrusion indents the ventral thecal sac. Superimposed left-sided facet degeneration with ligament flavum hypertrophy. Resultant mild to moderate spinal stenosis with mild cord flattening. Moderate to severe left C4 foraminal stenosis. No significant right foraminal narrowing. C4-C5: Large central disc protrusion with associated superior and inferior migration of disc material (series 18, image 13). Protruding disc impinges upon the cervical spinal cord with secondary cord compression and cord signal changes. Thecal sac measures 3 mm in AP diameter at its most narrow point. Superimposed right-sided uncovertebral hypertrophy with suspected mild right C5 foraminal stenosis. No significant left foraminal narrowing. C5-C6: Chronic intervertebral disc space narrowing with diffuse degenerative disc osteophyte. Broad posterior component flattens and effaces the ventral thecal sac. Moderate to severe spinal stenosis. Thecal sac measures approximately 6 mm in AP diameter. Moderate to severe left worse than right C6 foraminal narrowing. C6-C7: Diffuse disc bulge with bilateral uncovertebral hypertrophy. Disc bulge eccentric to the right. Flattening of the ventral thecal sac with resultant mild-to-moderate spinal stenosis. Severe right with moderate left C7 foraminal narrowing. C7-T1: Trace anterolisthesis. Minimal disc bulge. Moderate bilateral facet hypertrophy. No spinal stenosis. Probable mild bilateral C8 foraminal narrowing. MRI THORACIC SPINE FINDINGS Alignment: Straightening of the normal midthoracic kyphosis. Trace anterolisthesis of T2 on T3 and T3 on T4.  Vertebrae: Compression deformity involving the inferior endplate of T12 with mild 30% height loss without bony retropulsion. Minimal residual marrow edema, suggesting a late subacute to chronic compression fracture. Additional chronic compression deformities involving the superior endplates of T4, T9, and L1 noted. No acute compression fracture. Underlying bone marrow signal intensity within normal limits. Multiple  scattered benign hemangiomata noted. No worrisome osseous lesions. No other abnormal marrow edema. Cord: Signal intensity within the thoracic spinal cord is grossly within normal limits. Posterior Fossa, vertebral arteries, paraspinal tissues: Paraspinous soft tissues within normal limits. Partially visualized lungs are grossly clear. 12 mm simple cyst noted within the liver. Cholelithiasis noted. Few scattered T2 hyperintense simple renal cyst noted. There is a 12 mm T2 hypointense exophytic lesion extending from the posterior left kidney, indeterminate (series 25, image 52). Disc levels: T8-9: Small left paracentral disc protrusion mildly indents the left ventral thecal sac (series 25, image 35). Minimal flattening of the ventral cord without significant stenosis. No other significant disc pathology seen within the thoracic spine. Mild diffuse prominence of the dorsal epidural fat noted throughout the midthoracic spine. No other significant stenosis. IMPRESSION: MRI HEAD IMPRESSION: 1. Motion degraded exam. 2. No acute intracranial abnormality. 3. Scattered T2/FLAIR hyperintensity involving the supratentorial cerebral white matter, nonspecific, but most commonly related to chronic microvascular ischemic disease. Overall, appearance is moderate in nature. MRI CERVICAL SPINE IMPRESSION: 1. Motion degraded exam. 2. Large central disc protrusion at C4-5 with resultant severe spinal stenosis and cord compression. Cord signal changes extending from C3 through C6-7 concerning for edema. Emergent neuro surgical  consultation recommended. 3. Additional cervical spondylosis at C3-4, C5-6, and C6-7. Additional moderate to severe spinal stenosis at C5-6, with mild to moderate canal narrowing at C3-4 and C6-7. Moderate to severe bilateral foraminal narrowing at C4, C6, and C7 as above. MRI THORACIC SPINE IMPRESSION: 1. No acute abnormality within the thoracic spine. Normal MRI appearance of the thoracic spinal cord. 2. Late subacute to chronic compression deformity involving the inferior endplate of T12 with mild 30% height loss without bony retropulsion. Additional mild chronic compression deformities of T4, T9, and L1. 3. Left paracentral disc protrusion at T8-9 without significant stenosis. 4. 12 mm T2 hypointense exophytic lesion extending from the posterior left kidney, indeterminate. Further assessment with dedicated renal mass protocol CT and/or MRI recommended for further evaluation. This could be performed on a nonemergent outpatient basis. 5. Cholelithiasis. Critical Value/emergent results were called by telephone at the time of interpretation on 10/19/2019 at 7:43 am to provider Michela Pitcher , who verbally acknowledged these results. Electronically Signed   By: Rise Mu M.D.   On: 10/19/2019 07:48   MR Cervical Spine Wo Contrast  Result Date: 10/19/2019 CLINICAL DATA:  Initial evaluation for acute generalized weakness, inability to stand with loss of grip bilaterally. EXAM: MRI HEAD WITHOUT CONTRAST MRI CERVICAL SPINE WITHOUT CONTRAST MRI THORACIC SPINE WITHOUT CONTRAST TECHNIQUE: Multiplanar, multiecho pulse sequences of the brain and surrounding structures, and cervical spine, to include the craniocervical junction and cervicothoracic junction, and thoracic spine were obtained without intravenous contrast. COMPARISON:  None available. FINDINGS: MRI HEAD FINDINGS Brain: Examination moderately degraded by motion artifact. Cerebral volume within normal limits for age. Patchy T2/FLAIR hyperintensity seen  involving the periventricular, deep, and subcortical white matter both cerebral hemispheres, nonspecific, but overall moderate in appearance. No abnormal foci of restricted diffusion to suggest acute or subacute ischemia. Gray-white matter differentiation maintained. No encephalomalacia to suggest chronic cortical infarction. No foci of susceptibility artifact to suggest acute or chronic intracranial hemorrhage. No mass lesion, midline shift or mass effect. No hydrocephalus or extra-axial fluid collection. Pituitary gland suprasellar region normal. Midline structures intact. Vascular: Major intracranial vascular flow voids are grossly maintained at the skull base. Skull and upper cervical spine: Craniocervical junction normal. Bone marrow signal intensity within normal limits. No  scalp soft tissue abnormality. Sinuses/Orbits: Globes and orbital soft tissues grossly within normal limits. Paranasal sinuses are largely clear. Trace left mastoid effusion noted, of doubtful significance. Other: None. MRI CERVICAL SPINE FINDINGS Alignment: Examination markedly degraded by motion artifact. Straightening of the normal cervical lordosis. Trace anterolisthesis of C7 on T1. Vertebrae: Vertebral body height maintained without evidence for acute or chronic fracture. Bone marrow signal intensity within normal limits. No worrisome osseous lesions. No abnormal marrow edema. Cord: Abnormal cord signal intensity seen within the cervical spinal cord extending from C3 through C6-7 (series 16, image 7). Cord is somewhat swollen in appearance, suggesting a edema rather than myelomalacia. Finding favored to be secondary to disc herniation and cord compression at the C4-5 level. Otherwise, signal intensity within the cord is grossly within normal limits on this motion degraded exam. Posterior Fossa, vertebral arteries, paraspinal tissues: Craniocervical junction normal. Paraspinous and prevertebral soft tissues normal. Normal flow voids  seen within the vertebral arteries bilaterally. Disc levels: C2-C3: Negative interspace. Left-sided facet degeneration. No spinal stenosis. Mild left C3 foraminal narrowing. No significant right foraminal encroachment. C3-C4: Central disc protrusion indents the ventral thecal sac. Superimposed left-sided facet degeneration with ligament flavum hypertrophy. Resultant mild to moderate spinal stenosis with mild cord flattening. Moderate to severe left C4 foraminal stenosis. No significant right foraminal narrowing. C4-C5: Large central disc protrusion with associated superior and inferior migration of disc material (series 18, image 13). Protruding disc impinges upon the cervical spinal cord with secondary cord compression and cord signal changes. Thecal sac measures 3 mm in AP diameter at its most narrow point. Superimposed right-sided uncovertebral hypertrophy with suspected mild right C5 foraminal stenosis. No significant left foraminal narrowing. C5-C6: Chronic intervertebral disc space narrowing with diffuse degenerative disc osteophyte. Broad posterior component flattens and effaces the ventral thecal sac. Moderate to severe spinal stenosis. Thecal sac measures approximately 6 mm in AP diameter. Moderate to severe left worse than right C6 foraminal narrowing. C6-C7: Diffuse disc bulge with bilateral uncovertebral hypertrophy. Disc bulge eccentric to the right. Flattening of the ventral thecal sac with resultant mild-to-moderate spinal stenosis. Severe right with moderate left C7 foraminal narrowing. C7-T1: Trace anterolisthesis. Minimal disc bulge. Moderate bilateral facet hypertrophy. No spinal stenosis. Probable mild bilateral C8 foraminal narrowing. MRI THORACIC SPINE FINDINGS Alignment: Straightening of the normal midthoracic kyphosis. Trace anterolisthesis of T2 on T3 and T3 on T4. Vertebrae: Compression deformity involving the inferior endplate of T12 with mild 30% height loss without bony retropulsion.  Minimal residual marrow edema, suggesting a late subacute to chronic compression fracture. Additional chronic compression deformities involving the superior endplates of T4, T9, and L1 noted. No acute compression fracture. Underlying bone marrow signal intensity within normal limits. Multiple scattered benign hemangiomata noted. No worrisome osseous lesions. No other abnormal marrow edema. Cord: Signal intensity within the thoracic spinal cord is grossly within normal limits. Posterior Fossa, vertebral arteries, paraspinal tissues: Paraspinous soft tissues within normal limits. Partially visualized lungs are grossly clear. 12 mm simple cyst noted within the liver. Cholelithiasis noted. Few scattered T2 hyperintense simple renal cyst noted. There is a 12 mm T2 hypointense exophytic lesion extending from the posterior left kidney, indeterminate (series 25, image 52). Disc levels: T8-9: Small left paracentral disc protrusion mildly indents the left ventral thecal sac (series 25, image 35). Minimal flattening of the ventral cord without significant stenosis. No other significant disc pathology seen within the thoracic spine. Mild diffuse prominence of the dorsal epidural fat noted throughout the midthoracic spine. No other  significant stenosis. IMPRESSION: MRI HEAD IMPRESSION: 1. Motion degraded exam. 2. No acute intracranial abnormality. 3. Scattered T2/FLAIR hyperintensity involving the supratentorial cerebral white matter, nonspecific, but most commonly related to chronic microvascular ischemic disease. Overall, appearance is moderate in nature. MRI CERVICAL SPINE IMPRESSION: 1. Motion degraded exam. 2. Large central disc protrusion at C4-5 with resultant severe spinal stenosis and cord compression. Cord signal changes extending from C3 through C6-7 concerning for edema. Emergent neuro surgical consultation recommended. 3. Additional cervical spondylosis at C3-4, C5-6, and C6-7. Additional moderate to severe spinal  stenosis at C5-6, with mild to moderate canal narrowing at C3-4 and C6-7. Moderate to severe bilateral foraminal narrowing at C4, C6, and C7 as above. MRI THORACIC SPINE IMPRESSION: 1. No acute abnormality within the thoracic spine. Normal MRI appearance of the thoracic spinal cord. 2. Late subacute to chronic compression deformity involving the inferior endplate of T12 with mild 30% height loss without bony retropulsion. Additional mild chronic compression deformities of T4, T9, and L1. 3. Left paracentral disc protrusion at T8-9 without significant stenosis. 4. 12 mm T2 hypointense exophytic lesion extending from the posterior left kidney, indeterminate. Further assessment with dedicated renal mass protocol CT and/or MRI recommended for further evaluation. This could be performed on a nonemergent outpatient basis. 5. Cholelithiasis. Critical Value/emergent results were called by telephone at the time of interpretation on 10/19/2019 at 7:43 am to provider Michela Pitcher , who verbally acknowledged these results. Electronically Signed   By: Rise Mu M.D.   On: 10/19/2019 07:48   MR THORACIC SPINE WO CONTRAST  Result Date: 10/19/2019 CLINICAL DATA:  Initial evaluation for acute generalized weakness, inability to stand with loss of grip bilaterally. EXAM: MRI HEAD WITHOUT CONTRAST MRI CERVICAL SPINE WITHOUT CONTRAST MRI THORACIC SPINE WITHOUT CONTRAST TECHNIQUE: Multiplanar, multiecho pulse sequences of the brain and surrounding structures, and cervical spine, to include the craniocervical junction and cervicothoracic junction, and thoracic spine were obtained without intravenous contrast. COMPARISON:  None available. FINDINGS: MRI HEAD FINDINGS Brain: Examination moderately degraded by motion artifact. Cerebral volume within normal limits for age. Patchy T2/FLAIR hyperintensity seen involving the periventricular, deep, and subcortical white matter both cerebral hemispheres, nonspecific, but overall moderate  in appearance. No abnormal foci of restricted diffusion to suggest acute or subacute ischemia. Gray-white matter differentiation maintained. No encephalomalacia to suggest chronic cortical infarction. No foci of susceptibility artifact to suggest acute or chronic intracranial hemorrhage. No mass lesion, midline shift or mass effect. No hydrocephalus or extra-axial fluid collection. Pituitary gland suprasellar region normal. Midline structures intact. Vascular: Major intracranial vascular flow voids are grossly maintained at the skull base. Skull and upper cervical spine: Craniocervical junction normal. Bone marrow signal intensity within normal limits. No scalp soft tissue abnormality. Sinuses/Orbits: Globes and orbital soft tissues grossly within normal limits. Paranasal sinuses are largely clear. Trace left mastoid effusion noted, of doubtful significance. Other: None. MRI CERVICAL SPINE FINDINGS Alignment: Examination markedly degraded by motion artifact. Straightening of the normal cervical lordosis. Trace anterolisthesis of C7 on T1. Vertebrae: Vertebral body height maintained without evidence for acute or chronic fracture. Bone marrow signal intensity within normal limits. No worrisome osseous lesions. No abnormal marrow edema. Cord: Abnormal cord signal intensity seen within the cervical spinal cord extending from C3 through C6-7 (series 16, image 7). Cord is somewhat swollen in appearance, suggesting a edema rather than myelomalacia. Finding favored to be secondary to disc herniation and cord compression at the C4-5 level. Otherwise, signal intensity within the cord is grossly within  normal limits on this motion degraded exam. Posterior Fossa, vertebral arteries, paraspinal tissues: Craniocervical junction normal. Paraspinous and prevertebral soft tissues normal. Normal flow voids seen within the vertebral arteries bilaterally. Disc levels: C2-C3: Negative interspace. Left-sided facet degeneration. No spinal  stenosis. Mild left C3 foraminal narrowing. No significant right foraminal encroachment. C3-C4: Central disc protrusion indents the ventral thecal sac. Superimposed left-sided facet degeneration with ligament flavum hypertrophy. Resultant mild to moderate spinal stenosis with mild cord flattening. Moderate to severe left C4 foraminal stenosis. No significant right foraminal narrowing. C4-C5: Large central disc protrusion with associated superior and inferior migration of disc material (series 18, image 13). Protruding disc impinges upon the cervical spinal cord with secondary cord compression and cord signal changes. Thecal sac measures 3 mm in AP diameter at its most narrow point. Superimposed right-sided uncovertebral hypertrophy with suspected mild right C5 foraminal stenosis. No significant left foraminal narrowing. C5-C6: Chronic intervertebral disc space narrowing with diffuse degenerative disc osteophyte. Broad posterior component flattens and effaces the ventral thecal sac. Moderate to severe spinal stenosis. Thecal sac measures approximately 6 mm in AP diameter. Moderate to severe left worse than right C6 foraminal narrowing. C6-C7: Diffuse disc bulge with bilateral uncovertebral hypertrophy. Disc bulge eccentric to the right. Flattening of the ventral thecal sac with resultant mild-to-moderate spinal stenosis. Severe right with moderate left C7 foraminal narrowing. C7-T1: Trace anterolisthesis. Minimal disc bulge. Moderate bilateral facet hypertrophy. No spinal stenosis. Probable mild bilateral C8 foraminal narrowing. MRI THORACIC SPINE FINDINGS Alignment: Straightening of the normal midthoracic kyphosis. Trace anterolisthesis of T2 on T3 and T3 on T4. Vertebrae: Compression deformity involving the inferior endplate of T12 with mild 30% height loss without bony retropulsion. Minimal residual marrow edema, suggesting a late subacute to chronic compression fracture. Additional chronic compression deformities  involving the superior endplates of T4, T9, and L1 noted. No acute compression fracture. Underlying bone marrow signal intensity within normal limits. Multiple scattered benign hemangiomata noted. No worrisome osseous lesions. No other abnormal marrow edema. Cord: Signal intensity within the thoracic spinal cord is grossly within normal limits. Posterior Fossa, vertebral arteries, paraspinal tissues: Paraspinous soft tissues within normal limits. Partially visualized lungs are grossly clear. 12 mm simple cyst noted within the liver. Cholelithiasis noted. Few scattered T2 hyperintense simple renal cyst noted. There is a 12 mm T2 hypointense exophytic lesion extending from the posterior left kidney, indeterminate (series 25, image 52). Disc levels: T8-9: Small left paracentral disc protrusion mildly indents the left ventral thecal sac (series 25, image 35). Minimal flattening of the ventral cord without significant stenosis. No other significant disc pathology seen within the thoracic spine. Mild diffuse prominence of the dorsal epidural fat noted throughout the midthoracic spine. No other significant stenosis. IMPRESSION: MRI HEAD IMPRESSION: 1. Motion degraded exam. 2. No acute intracranial abnormality. 3. Scattered T2/FLAIR hyperintensity involving the supratentorial cerebral white matter, nonspecific, but most commonly related to chronic microvascular ischemic disease. Overall, appearance is moderate in nature. MRI CERVICAL SPINE IMPRESSION: 1. Motion degraded exam. 2. Large central disc protrusion at C4-5 with resultant severe spinal stenosis and cord compression. Cord signal changes extending from C3 through C6-7 concerning for edema. Emergent neuro surgical consultation recommended. 3. Additional cervical spondylosis at C3-4, C5-6, and C6-7. Additional moderate to severe spinal stenosis at C5-6, with mild to moderate canal narrowing at C3-4 and C6-7. Moderate to severe bilateral foraminal narrowing at C4, C6,  and C7 as above. MRI THORACIC SPINE IMPRESSION: 1. No acute abnormality within the thoracic spine. Normal MRI  appearance of the thoracic spinal cord. 2. Late subacute to chronic compression deformity involving the inferior endplate of T12 with mild 30% height loss without bony retropulsion. Additional mild chronic compression deformities of T4, T9, and L1. 3. Left paracentral disc protrusion at T8-9 without significant stenosis. 4. 12 mm T2 hypointense exophytic lesion extending from the posterior left kidney, indeterminate. Further assessment with dedicated renal mass protocol CT and/or MRI recommended for further evaluation. This could be performed on a nonemergent outpatient basis. 5. Cholelithiasis. Critical Value/emergent results were called by telephone at the time of interpretation on 10/19/2019 at 7:43 am to provider Michela Pitcher , who verbally acknowledged these results. Electronically Signed   By: Rise Mu M.D.   On: 10/19/2019 07:48   DG C-Arm 1-60 Min  Result Date: 10/19/2019 CLINICAL DATA:  Elective surgery.  ACDF. EXAM: CERVICAL SPINE - 2-3 VIEW; DG C-ARM 1-60 MIN COMPARISON:  Preoperative MRI earlier today. FINDINGS: Single lateral fluoroscopic spot view obtained in the operating room. Anterior fusion with interbody spacers at C4-C5 and C5-C6. Fluoroscopy time 5 seconds. Dose 0.56 mGy. IMPRESSION: Intraoperative fluoroscopy after C4-C5 and C5-C6 anterior fusion. Electronically Signed   By: Narda Rutherford M.D.   On: 10/19/2019 15:36   CT Angio Chest/Abd/Pel for Dissection W and/or W/WO  Result Date: 10/19/2019 CLINICAL DATA:  Chest and back pain. Clinical suspicion for aortic dissection. EXAM: CT ANGIOGRAPHY CHEST, ABDOMEN AND PELVIS TECHNIQUE: Non-contrast CT of the chest was initially obtained. Multidetector CT imaging through the chest, abdomen and pelvis was performed using the standard protocol during bolus administration of intravenous contrast. Multiplanar reconstructed images  and MIPs were obtained and reviewed to evaluate the vascular anatomy. CONTRAST:  OMNIPAQUE IOHEXOL 350 MG/ML SOLN COMPARISON:  02/06/2018 FINDINGS: CTA CHEST FINDINGS Cardiovascular: Thoracic aorta is normal in caliber. No dissection. Minimal atherosclerosis along the descending portion. Arch branch vessels are widely patent. Heart is normal in size. No pericardial effusion. Mild three-vessel coronary artery calcifications. Pulmonary arteries are well opacified. No evidence of a pulmonary embolism. Mediastinum/Nodes: No enlarged mediastinal, hilar, or axillary lymph nodes. Thyroid gland, trachea, and esophagus demonstrate no significant findings. Lungs/Pleura: Mild centrilobular emphysema. Minor atelectasis in the posterior lung bases. Lungs otherwise clear. No pleural effusion or pneumothorax. Musculoskeletal: Mild compression fracture of T12. This is new compared to radiographs from 05/12/2008. It is not visualized on the CT lung cancer screening exam from 02/06/2018. There is a minor depression of the upper endplate of T4 which is chronic. No other fractures. No osteoblastic or osteolytic lesions. Review of the MIP images confirms the above findings. CTA ABDOMEN AND PELVIS FINDINGS VASCULAR Aorta: Atherosclerotic plaque mostly along the infrarenal abdominal aorta. No significant stenosis. No dissection and no aneurysm. Celiac: Patent without evidence of aneurysm, dissection, vasculitis or significant stenosis. SMA: Patent without evidence of aneurysm, dissection, vasculitis or significant stenosis. Renals: Atherosclerotic plaque noted just beyond the origins of both renal arteries, both vessels narrowed by 50%. IMA: Atherosclerotic plaque at the origin. Vessel these mildly narrowed, but patent. Inflow: Atherosclerotic plaque along the common, internal and external iliac arteries. No significant stenosis. Veins: No obvious venous abnormality within the limitations of this arterial phase study. Review of the  MIP images confirms the above findings. NON-VASCULAR Hepatobiliary: Stable low-density lesion at the dome adjacent to the intrahepatic inferior vena cava, 11 mm in size, consistent with a cyst. Liver normal in size and overall attenuation. No other masses or lesions. Dependent gallstones. No evidence of acute cholecystitis. No bile duct dilation.  Pancreas: Unremarkable. No pancreatic ductal dilatation or surrounding inflammatory changes. Spleen: Normal in size without focal abnormality. Adrenals/Urinary Tract: Kidneys normal in size, orientation and position. Four low-density left renal masses, largest from the anterior midpole, 3 cm, all consistent with cysts. No other masses, no stones and no hydronephrosis. Ureters normal in course and in caliber. Bladder decompressed with a Foley catheter. Stomach/Bowel: Stomach is within normal limits. Appendix appears normal. No evidence of bowel wall thickening, distention, or inflammatory changes. Lymphatic: No enlarged lymph nodes. Reproductive: Uterus and bilateral adnexa are unremarkable. Other: No abdominal wall hernia or abnormality. No abdominopelvic ascites. Musculoskeletal: Mild depression of the upper endplate of L3 with associated Schmorl's node, which appears chronic. Compression fracture of T12 as detailed under the chest section. No other fractures. No osteoblastic or osteolytic lesions. Previous left proximal femur fracture reduced with a compression screw and intramedullary rod. Review of the MIP images confirms the above findings. IMPRESSION: CTA FINDINGS 1. No thoracoabdominal aortic dissection.  No aneurysm. 2. Minor atherosclerotic plaque along the descending thoracic aorta with more notable plaque along the abdominal aorta. No significant stenosis. 3. Mild, proximally 50%, atherosclerotic narrowing of the renal arteries. No other branch vessel narrowing. NON CTA FINDINGS 1. No acute findings within the chest, abdomen or pelvis. 2. Mild centrilobular  emphysema. Mild three-vessel coronary artery calcifications. 3. Left renal cysts.  Gallstones. Electronically Signed   By: Amie Portlandavid  Ormond M.D.   On: 10/19/2019 04:29    Assessment/Plan: Patient is one day status post C4-5, C5-6 ACDF performed due to significant rupture at C4-5 and significant cervical stenosis at C5-6. Her strength and sensation are improving. She has worked with both PT and OT who are recommending outpatient therapy at discharge.   LOS: 1 day   -Pain management -Continue to mobilize with assistance  Val EagleMeghan Lovel Suazo, DNP, AGNP-C Nurse Practitioner  [facility] [address], [address], [place], [place] [address] P: [phone]    F: [phone]  10/20/2019, 10:30 AM

## 2019-10-20 NOTE — Evaluation (Signed)
Physical Therapy Treatment Patient Details Name: LONNETTE SHRODE MRN: [id number] DOB: [birth year] Today's Date: 10/20/2019    History of Present Illness Pt is a 64 year old woman admitted with severe neck pain and weakness. MRI+ for severe disc herniation with cord compression, stenosis and spondylosis. Underwent C 4-5, 5-6 ACDF on day of admission. PMH: smoker, CAD, PVD, DVT, HTN, narcotic dependence.    PT Comments    Patient received in bed. Moving all extremities, slightly impulsive. Requires instruction on cervical spine precautions. Soft collar donned and readjusted. Patient is motivated to get out of bed. Requires instruction for log rolling and pushing up into sitting- performed with min guard. She is able to stand with min hand held assist. Ambulated 30 feet in room with +1-2 min hand held assist. She will continue to benefit from skilled PT while here to improve balance and strength for safe return home at discharge.       Follow Up Recommendations  Outpatient PT     Equipment Recommendations  None recommended by PT    Recommendations for Other Services       Precautions / Restrictions Precautions Precautions: Fall;Cervical Precaution Booklet Issued: No Precaution Comments: verbally educated in cervical precautions Required Braces or Orthoses: Cervical Brace Cervical Brace: Soft collar;At all times Restrictions Weight Bearing Restrictions: No    Mobility  Bed Mobility Overal bed mobility: Needs Assistance Bed Mobility: Sidelying to Sit;Rolling Rolling: Min guard Sidelying to sit: Min guard       General bed mobility comments: educated in log roll technique  Transfers Overall transfer level: Needs assistance Equipment used: 1 person hand held assist Transfers: Sit to/from Stand Sit to Stand: Min assist         General transfer comment: assist to rise and steady  Ambulation/Gait Ambulation/Gait assistance: +2 physical assistance;Min assist Gait Distance  (Feet): 30 Feet Assistive device: 2 person hand held assist Gait Pattern/deviations: Step-through pattern Gait velocity: decr   General Gait Details: patient slightly unsteady requiring min hand held assist with ambulation.   Stairs             Wheelchair Mobility    Modified Rankin (Stroke Patients Only)       Balance Overall balance assessment: Needs assistance Sitting-balance support: Feet supported Sitting balance-Leahy Scale: Fair     Standing balance support: Bilateral upper extremity supported;During functional activity Standing balance-Leahy Scale: Fair Standing balance comment: reliant on at least one UE support for mobility and safety                            Cognition Arousal/Alertness: Awake/alert Behavior During Therapy: Impulsive Overall Cognitive Status: Within Functional Limits for tasks assessed                                        Exercises      General Comments        Pertinent Vitals/Pain Pain Assessment: 0-10 Pain Score: 7  Pain Location: shoulders, LEs Pain Descriptors / Indicators: Aching;Spasm Pain Intervention(s): Monitored during session;Limited activity within patient's tolerance    Home Living Family/patient expects to be discharged to:: Private residence Living Arrangements: Parent Available Help at Discharge: Family;Available 24 hours/day Type of Home: Mobile home Home Access: Ramped entrance   Home Layout: One level Home Equipment: Cane - single point;Walker - 2 wheels;Walker - standard;Wheelchair - [employer]  commode      Prior Function Level of Independence: Independent          PT Goals (current goals can now be found in the care plan section) Acute Rehab PT Goals Patient Stated Goal: return home PT Goal Formulation: With patient Time For Goal Achievement: 10/27/19 Potential to Achieve Goals: Good    Frequency    Min 5X/week      PT Plan      Co-evaluation               AM-PAC PT "6 Clicks" Mobility   Outcome Measure  Help needed turning from your back to your side while in a flat bed without using bedrails?: A Little Help needed moving from lying on your back to sitting on the side of a flat bed without using bedrails?: A Little Help needed moving to and from a bed to a chair (including a wheelchair)?: A Little Help needed standing up from a chair using your arms (e.g., wheelchair or bedside chair)?: A Little Help needed to walk in hospital room?: A Little Help needed climbing 3-5 steps with a railing? : A Little 6 Click Score: 18    End of Session   Activity Tolerance: Patient tolerated treatment well Patient left: in chair;with call bell/phone within reach;with chair alarm set Nurse Communication: Mobility status PT Visit Diagnosis: Unsteadiness on feet (R26.81);Muscle weakness (generalized) (M62.81);Difficulty in walking, not elsewhere classified (R26.2);Other abnormalities of gait and mobility (R26.89);Pain Pain - Right/Left: Left Pain - part of body: Shoulder     Time: 5465-0354 PT Time Calculation (min) (ACUTE ONLY): 23 min  Charges:  $Gait Training: 8-22 mins                     Alylah Blakney, PT, GCS 10/20/19,10:22 AM

## 2019-10-21 MED ORDER — BACLOFEN 10 MG PO TABS: 10.0000 mg | ORAL_TABLET | Freq: Three times a day (TID) | ORAL | 0 refills | Status: DC

## 2019-10-21 MED ORDER — APIXABAN 2.5 MG PO TABS: 2.5000 mg | ORAL_TABLET | Freq: Two times a day (BID) | ORAL | 0 refills | Status: DC

## 2019-10-21 NOTE — Discharge Summary (Signed)
Physician Discharge Summary  Patient ID: Cynthia Hobbs MRN: [id number] DOB/AGE: March 07, 1956 64 y.o.  Admit date: 10/18/2019 Discharge date: 10/21/2019  Admission Diagnoses:  Discharge Diagnoses:  Active Problems:   Cervical myelopathy [facility])   Discharged Condition: good  Hospital Course: Patient admitted to the hospital where she underwent emergent two-level anterior cervical decompression and fusion for treatment of her severe compressive myelopathy with marked quadriparesis.  Postop and she has made remarkable recovery.  Her preoperative numbness paresthesias pain and weakness have almost completely resolved.  She is now standing and ambulating without difficulty.  She is voiding well.  She is using her hands and fingers well.  She feels comfortable going home.  Consults:   Significant Diagnostic Studies:   Treatments:   Discharge Exam: Blood pressure 108/73, pulse 87, temperature 97.7 F (36.5 C), temperature source Oral, resp. rate 18, height 5\' 2"  (1.575 m), weight 63.5 kg, SpO2 95 %. Awake and alert.  Oriented and appropriate.  Motor and sensory function intact excited from some mild weakness of intrinsic function in both hands.  Wound clean and dry.  Chest and abdomen benign.  Extremities free from injury deformity.  Disposition: Discharge disposition: 01-Home or Self Care       Discharge Instructions    Ambulatory referral to Physical Therapy   Complete by: As directed      Allergies as of 10/21/2019   No Known Allergies     Medication List    STOP taking these medications   methocarbamol 500 MG tablet Commonly known as: ROBAXIN     TAKE these medications   amLODipine 5 MG tablet Commonly known as: NORVASC Take 5 mg by mouth daily.   apixaban 2.5 MG Tabs tablet Commonly known as: Eliquis Take 1 tablet (2.5 mg total) by mouth 2 (two) times daily for 28 days. Start taking on: October 27, 2019 What changed: These instructions start on October 27, 2019. If you are  unsure what to do until then, ask your doctor or other care provider.   baclofen 10 MG tablet Commonly known as: LIORESAL Take 1 tablet (10 mg total) by mouth 3 (three) times daily.   clopidogrel 75 MG tablet Commonly known as: PLAVIX Take 1 tablet (75 mg total) by mouth daily with breakfast.   oxyCODONE-acetaminophen 5-325 MG tablet Commonly known as: PERCOCET/ROXICET Take 1-2 tablets by mouth every 6 (six) hours as needed for moderate pain or severe pain.   pantoprazole 40 MG tablet Commonly known as: PROTONIX Take 1 tablet by mouth once daily   polyethylene glycol 17 g packet Commonly known as: MIRALAX / GLYCOLAX Take 17 g by mouth daily as needed for mild constipation.   rosuvastatin 10 MG tablet Commonly known as: CRESTOR Take 10 mg by mouth daily.   Vitamin D3 50 MCG (2000 UT) Tabs Take 2,000 Units by mouth daily.       Follow-up Information    October 29, 2019, MD. Schedule an appointment as soon as possible for a visit in 1 week(s).   Specialty: Neurosurgery Contact information: [address] [place] [id number]               Signed: [phone] 10/21/2019, 9:30 AM

## 2019-10-21 NOTE — Progress Notes (Signed)
Physical Therapy Treatment Patient Details Name: Cynthia Hobbs MRN: [id number] DOB: 12/25/1955 Today's Date: 10/21/2019    History of Present Illness Pt is a 64 year old woman admitted with severe neck pain and weakness. MRI+ for severe disc herniation with cord compression, stenosis and spondylosis. Underwent C 4-5, 5-6 ACDF on day of admission. PMH: smoker, CAD, PVD, DVT, HTN, narcotic dependence.    PT Comments    Patient received ambulating in room with RW. Reports she is doing better. Reports improved use of B UEs. She is mod independent with all mobility at this time. She ambulated 200 feet with RW and supervision. No LOB or significant difficulty noted. She has no further inpatient PT needs at this time. Should be discharging home today.      Follow Up Recommendations  Outpatient PT     Equipment Recommendations  None recommended by PT    Recommendations for Other Services       Precautions / Restrictions Precautions Precautions: Cervical Precaution Booklet Issued: No Precaution Comments: verbally educated in cervical precautions Required Braces or Orthoses: Cervical Brace Cervical Brace: Soft collar;At all times Restrictions Weight Bearing Restrictions: No    Mobility  Bed Mobility Overal bed mobility: Needs Assistance Bed Mobility: Rolling;Sidelying to Sit;Sit to Sidelying Rolling: Supervision Sidelying to sit: Supervision     Sit to sidelying: Supervision General bed mobility comments: not assessed, patient up ambulating in room unpon arrival  Transfers Overall transfer level: Modified independent Equipment used: Rolling walker (2 wheeled) Transfers: Sit to/from Stand Sit to Stand: Modified independent (Device/Increase time)         General transfer comment: able to rise from commode without assist.  Ambulation/Gait Ambulation/Gait assistance: Supervision Gait Distance (Feet): 200 Feet Assistive device: Rolling walker (2 wheeled) Gait  Pattern/deviations: Step-through pattern Gait velocity: WNL   General Gait Details: patient ambulating independently in room with RW. Able to ambulate out in hall with supervision.   Stairs             Wheelchair Mobility    Modified Rankin (Stroke Patients Only)       Balance Overall balance assessment: Modified Independent Sitting-balance support: Feet supported Sitting balance-Leahy Scale: Normal     Standing balance support: Bilateral upper extremity supported;During functional activity Standing balance-Leahy Scale: Good Standing balance comment: able to wash hands at sink without UE support, reliant on RW for ambulation                            Cognition Arousal/Alertness: Awake/alert Behavior During Therapy: WFL for tasks assessed/performed Overall Cognitive Status: Within Functional Limits for tasks assessed                                 General Comments: pt with no signs of impulsivity; appears to have good safety awareness      Exercises      General Comments        Pertinent Vitals/Pain Pain Assessment: 0-10 Pain Score: 3  Pain Location: hands Pain Descriptors / Indicators: Pins and needles Pain Intervention(s): Monitored during session;Limited activity within patient's tolerance    Home Living                      Prior Function            PT Goals (current goals can now be found in the care  plan section) Acute Rehab PT Goals Patient Stated Goal: return home PT Goal Formulation: With patient Time For Goal Achievement: 10/27/19 Potential to Achieve Goals: Good Progress towards PT goals: Progressing toward goals    Frequency    Min 5X/week      PT Plan Current plan remains appropriate    Co-evaluation              AM-PAC PT "6 Clicks" Mobility   Outcome Measure  Help needed turning from your back to your side while in a flat bed without using bedrails?: None Help needed moving  from lying on your back to sitting on the side of a flat bed without using bedrails?: None Help needed moving to and from a bed to a chair (including a wheelchair)?: None Help needed standing up from a chair using your arms (e.g., wheelchair or bedside chair)?: None Help needed to walk in hospital room?: None Help needed climbing 3-5 steps with a railing? : None 6 Click Score: 24    End of Session Equipment Utilized During Treatment: Cervical collar Activity Tolerance: Patient tolerated treatment well Patient left: in chair;with call bell/phone within reach Nurse Communication: Mobility status PT Visit Diagnosis: Other abnormalities of gait and mobility (R26.89);Pain;Muscle weakness (generalized) (M62.81) Pain - Right/Left:  (bilateral) Pain - part of body: Hand     Time: 1203-1215 PT Time Calculation (min) (ACUTE ONLY): 12 min  Charges:  $Gait Training: 8-22 mins                     Hoyle Barkdull, PT, GCS 10/21/19,12:29 PM

## 2019-10-21 NOTE — TOC Transition Note (Signed)
Transition of Care [facility]) - CM/SW Discharge Note   Patient Details  Name: Cynthia Hobbs MRN: [id number] Date of Birth: 1955/11/28  Transition of Care [facility]) CM/SW Contact:  Janae Bridgeman, RN Phone Number: 10/21/2019, 10:16 AM   Clinical Narrative:    Patient to be discharged today.  Outpatient therapy is set up as requested by PT.  The patient is having family drive her home and she does not need dme.  Will follow for discharge today.   Final next level of care: OP Rehab Barriers to Discharge: Continued Medical Work up   Patient Goals and CMS Choice Patient states their goals for this hospitalization and ongoing recovery are:: Plans to discharge home with family and outpatient rehab CMS Medicare.gov Compare Post Acute Care list provided to:: Patient Choice offered to / list presented to : Patient  Discharge Placement                       Discharge Plan and Services   Discharge Planning Services: CM Consult                                 Social Determinants of Health (SDOH) Interventions     Readmission Risk Interventions Readmission Risk Prevention Plan 10/20/2019  Post Dischage Appt Complete  Medication Screening Complete  Transportation Screening Complete  Some recent data might be hidden

## 2019-10-21 NOTE — Progress Notes (Signed)
Occupational Therapy Treatment Patient Details Name: Cynthia Hobbs MRN: [id number] DOB: Sep 07, 1955 Today's Date: 10/21/2019    History of present illness Pt is a 64 year old woman admitted with severe neck pain and weakness. MRI+ for severe disc herniation with cord compression, stenosis and spondylosis. Underwent C 4-5, 5-6 ACDF on day of admission. PMH: smoker, CAD, PVD, DVT, HTN, narcotic dependence.   OT comments  Pt tolerating session well with ADL routine to dress, simulate bathing, perform toilet hygiene and reports that she will most likely use her mother's walk-in shower and RW for all mobility. Pt aware of precautions, but OTR summarizing them. Pt continues to have tingling in hands and use of built-up silverware to assist with self feeding. Pt using cell phone regularly with increased time. Pt would benefit from continued OT skilled services for ADL and [facility] OT following acutely.    Follow Up Recommendations  Outpatient OT    Equipment Recommendations  None recommended by OT    Recommendations for Other Services      Precautions / Restrictions Precautions Precautions: Fall;Cervical Precaution Booklet Issued: No Precaution Comments: verbally educated in cervical precautions Required Braces or Orthoses: Cervical Brace Cervical Brace: Soft collar;At all times Restrictions Weight Bearing Restrictions: No       Mobility Bed Mobility Overal bed mobility: Needs Assistance Bed Mobility: Rolling;Sidelying to Sit;Sit to Sidelying Rolling: Supervision Sidelying to sit: Supervision     Sit to sidelying: Supervision General bed mobility comments: Pt performing log roll technique  Transfers Overall transfer level: Needs assistance Equipment used: Rolling walker (2 wheeled) Transfers: Sit to/from Stand Sit to Stand: Supervision              Balance Overall balance assessment: Needs assistance   Sitting balance-Leahy Scale: Good     Standing balance support:  Bilateral upper extremity supported;During functional activity Standing balance-Leahy Scale: Fair Standing balance comment: standing at sink and at EOB                           ADL either performed or assessed with clinical judgement   ADL Overall ADL's : Needs assistance/impaired Eating/Feeding: Set up;Sitting Eating/Feeding Details (indicate cue type and reason): pt continues to use built up silverware with ease Grooming: Supervision/safety;Standing Grooming Details (indicate cue type and reason): no physical assist required; light ADL at sink in standing         Upper Body Dressing : Supervision/safety;Sitting;Standing Upper Body Dressing Details (indicate cue type and reason): may need assist with soft collar velcro- pt aware to leave on at all times other than bathing and that velcro is very strong so ask for help Lower Body Dressing: Supervision/safety;Sitting/lateral leans;Sit to/from stand   Toilet Transfer: Supervision/safety;Ambulation   Toileting- Clothing Manipulation and Hygiene: Supervision/safety;Sitting/lateral lean       Functional mobility during ADLs: Supervision/safety;Rolling walker General ADL Comments: Pt tolerating session well with ADL routine to dress, simulate bathing , perform toilet hygiene and reports that she will most likely use her mother's walk-in shower and RW for all mobility. Pt aware of precautions, but OTR summarizing them.     Vision       Perception     Praxis      Cognition Arousal/Alertness: Awake/alert Behavior During Therapy: WFL for tasks assessed/performed Overall Cognitive Status: Within Functional Limits for tasks assessed  General Comments: pt with no signs of impulsivity; appears to have good safety awareness        Exercises     Shoulder Instructions       General Comments      Pertinent Vitals/ Pain       Pain Assessment: 0-10 Pain Score: 3  Pain  Location: hands Pain Descriptors / Indicators: Pins and needles Pain Intervention(s): Monitored during session;Repositioned  Home Living                                          Prior Functioning/Environment              Frequency  Min 2X/week        Progress Toward Goals  OT Goals(current goals can now be found in the care plan section)  Progress towards OT goals: Progressing toward goals  Acute Rehab OT Goals Patient Stated Goal: return home OT Goal Formulation: With patient Time For Goal Achievement: 11/03/19 Potential to Achieve Goals: Good ADL Goals Pt Will Perform Eating: with modified independence;sitting;with adaptive utensils Pt Will Perform Grooming: with supervision;standing Pt Will Perform Upper Body Bathing: with min assist;sitting Pt Will Perform Lower Body Bathing: with min assist;sit to/from stand Pt Will Perform Upper Body Dressing: with set-up;sitting Pt Will Perform Lower Body Dressing: with min assist;sit to/from stand Pt Will Transfer to Toilet: with supervision;ambulating Pt Will Perform Toileting - Clothing Manipulation and hygiene: with supervision;sit to/from stand Additional ADL Goal #1: Pt will generalize cervical precautions during ADL and mobility.  Plan Discharge plan remains appropriate    Co-evaluation                 AM-PAC OT "6 Clicks" Daily Activity     Outcome Measure   Help from another person eating meals?: A Little Help from another person taking care of personal grooming?: A Lot Help from another person toileting, which includes using toliet, bedpan, or urinal?: A Little Help from another person bathing (including washing, rinsing, drying)?: A Lot Help from another person to put on and taking off regular upper body clothing?: A Little Help from another person to put on and taking off regular lower body clothing?: A Lot 6 Click Score: 15    End of Session Equipment Utilized During Treatment:  Cervical collar;Rolling walker  OT Visit Diagnosis: Unsteadiness on feet (R26.81);Pain Pain - part of body: Hand (B/L)   Activity Tolerance Patient tolerated treatment well   Patient Left in bed;with call bell/phone within reach   Nurse Communication Mobility status        Time: 7741-2878 OT Time Calculation (min): 26 min  Charges: OT General Charges $OT Visit: 1 Visit OT Treatments $Self Care/Home Management : 8-22 mins $Therapeutic Activity: 8-22 mins  Flora Lipps, OTR/L Acute Rehabilitation Services Pager: [phone] Office: [phone]    Marria Mathison C 10/21/2019, 11:40 AM

## 2019-10-21 NOTE — Progress Notes (Signed)
Patient called writer to room stating that she received a phone call from her pharmacy stating that her Eliquis will be over $300, and she stated that she  was not willing to pay that amount. I called Dr Lindalou Hose PA and notified her of patient's concern, she advised me to tell the patient to follow up with her cardiologist since she was on Eliquis prior to admission and all they did was restarting it. Patient stated that she has not taken Eliquis in a while but she takes plavix. I asked the patient to call her pharmacy and we were informed that the last time the patient filled her Eliquis was in may of 2020 and has not had any more refills since then. Patient has been on Plavix and has been taking it daily. Called Dr Lindalou Hose PA back and updated her and she advised that the patient should follow up with her PCP. Discharge instructions were explained to patient including calling Dr Dalphine Handing office if she doesn't have any more  pain medication at home per Dr Lindalou Hose PA, and to follow up with them in 1 week. Patient verbalized understanding. Packed all personal belongings. No further questions or concerns voiced.

## 2019-10-21 NOTE — Discharge Instructions (Signed)
Wound Care Keep incision covered and dry for two days.  If you shower, cover incision with plastic wrap.  Do not put any creams, lotions, or ointments on incision. Leave steri-strips on back.  They will fall off by themselves. Activity Walk each and every day, increasing distance each day. No lifting greater than 5 lbs.  Avoid excessive neck motion. No driving for 2 weeks; may ride as a passenger locally. If provided with back brace, wear when out of bed.  It is not necessary to wear brace in bed. Diet Resume your normal diet.  Return to Work Will be discussed at you follow up appointment. Call Your Doctor If Any of These Occur Redness, drainage, or swelling at the wound.  Temperature greater than 101 degrees. Severe pain not relieved by pain medication. Incision starts to come apart. Follow Up Appt Call today for appointment in 1-2 weeks ([phone]) or for problems.  If you have any hardware placed in your spine, you will need an x-ray before your appointment.    Gerome Sam [facility] Monday 10/27/19

## 2019-10-23 ENCOUNTER — Ambulatory Visit: Payer: BC Managed Care – PPO | Attending: Neurosurgery

## 2019-10-23 ENCOUNTER — Other Ambulatory Visit: Payer: Self-pay

## 2019-10-23 ENCOUNTER — Ambulatory Visit: Payer: BC Managed Care – PPO | Admitting: Physical Therapy

## 2019-10-23 DIAGNOSIS — R262 Difficulty in walking, not elsewhere classified: Secondary | ICD-10-CM | POA: Diagnosis not present

## 2019-10-23 DIAGNOSIS — M545 Low back pain: Secondary | ICD-10-CM | POA: Diagnosis not present

## 2019-10-23 DIAGNOSIS — Z981 Arthrodesis status: Secondary | ICD-10-CM | POA: Diagnosis not present

## 2019-10-23 DIAGNOSIS — G8929 Other chronic pain: Secondary | ICD-10-CM | POA: Diagnosis not present

## 2019-10-23 DIAGNOSIS — M6281 Muscle weakness (generalized): Secondary | ICD-10-CM | POA: Diagnosis not present

## 2019-10-23 DIAGNOSIS — R252 Cramp and spasm: Secondary | ICD-10-CM | POA: Diagnosis not present

## 2019-10-23 NOTE — Therapy (Addendum)
[place], [place], [address] Phone: [phone]   Fax:  [phone]  Physical Therapy Evaluation/Discharge  Patient Details  Name: Cynthia Hobbs MRN: [id number] Date of Birth: 1955/05/31 Referring Provider (PT): Dr Earnie Larsson, MD   Encounter Date: 10/23/2019   PT End of Session - 10/23/19 1423    Visit Number 1    Number of Visits 12    Date for PT Re-Evaluation 12/05/19    Authorization Type BCBS    PT Start Time 0215   pt late   PT Stop Time 0255    PT Time Calculation (min) 40 min    Equipment Utilized During Treatment Cervical collar    Activity Tolerance Patient tolerated treatment well    Behavior During Therapy [facility] for tasks assessed/performed           Past Medical History:  Diagnosis Date  . GERD (gastroesophageal reflux disease)   . Hypertension   . PAD (peripheral artery disease) ([place])     Past Surgical History:  Procedure Laterality Date  . ABDOMINAL AORTOGRAM W/LOWER EXTREMITY Bilateral 04/29/2018   Procedure: ABDOMINAL AORTOGRAM W/LOWER EXTREMITY;  Surgeon: Lorretta Harp, MD;  Location: [place];  Service: Cardiovascular;  Laterality: Bilateral;  . ANTERIOR CERVICAL DECOMP/DISCECTOMY FUSION N/A 10/19/2019   Procedure: Cervical four-five, Cervical five-six Anterior Cervical Discectomy with Interbody Fusion utilizing Interbody Cages, local harvested autograft and anterior plate instrumentation.;  Surgeon: Earnie Larsson, MD;  Location: [place];  Service: Neurosurgery;  Laterality: N/A;  . BACK SURGERY    . BREAST EXCISIONAL BIOPSY Right    benign  . CAST APPLICATION Left 09/21/8887   Procedure: APPLICATION OF SPLINT LEFT LOWER LEG;  Surgeon: Shona Needles, MD;  Location: [facility];  Service: Orthopedics;  Laterality: Left;  . INTRAMEDULLARY (IM) NAIL INTERTROCHANTERIC Left 08/14/2018   Procedure: INTRAMEDULLARY (IM) NAIL INTERTROCHANTRIC;  Surgeon: Shona Needles, MD;  Location: [place];  Service: Orthopedics;  Laterality: Left;  . ORIF HUMERUS FRACTURE Left 08/14/2018   Procedure: Open Reduction Internal Fixation (Orif) Proximal Humerus Fracture;  Surgeon: Shona Needles, MD;  Location: [place];  Service: Orthopedics;  Laterality: Left;  . PERIPHERAL VASCULAR ATHERECTOMY Right 04/29/2018   Procedure: PERIPHERAL VASCULAR ATHERECTOMY;  Surgeon: Lorretta Harp, MD;  Location: [facility];  Service: Cardiovascular;  Laterality: Right;  SFA    There were no vitals filed for this visit.    Subjective Assessment - 10/23/19 1416    Subjective She report cervical discectomy nad fusion C 3-4,4-5.     Using RW at home . She also has leg cramps and  back pain.     She gets assist for feeding and claning , all home tasks.  Poor coordia=nation in USE and weakness with numbness.  She has good feeling in legs but alot of cramping.  She woke paralyzed  in bed.    Pertinent History lumbar fusion, LT hip and shoulder ORIF, Fx calcaneous    Limitations Standing;Walking    How long can you stand comfortably? doesn't stand    How long can you walk comfortably? She is able to walk in home.    she walked in room at hospital    Patient Stated Goals She wants to be able to walk without  walker.    Pain Score 0-No pain    Pain Location Back    Pain Orientation Lower;Left;Right    Pain Type Chronic pain  Pain Frequency Intermittent              [facility] PT Assessment - 10/23/19 0001      Assessment   Medical Diagnosis cervical fusion    Referring Provider (PT) Dr Earnie Larsson, MD    Onset Date/Surgical Date --   10/19/19 sx   Next MD Visit Not sure    Prior Therapy no      Precautions   Precautions Cervical    Precaution Comments No lifting UE    Required Braces or Orthoses Cervical Brace    Cervical Brace Soft collar;At all times      Restrictions   Weight Bearing Restrictions No      Balance Screen   Has the patient fallen in the past 6 months No      [place] residence    Living Arrangements Parent    Available Help at Discharge Family    Type of Page to enter;Ramped entrance    [place] One level    [place] - 2 wheels      Prior Function   Level of Independence Needs assistance with homemaking;Requires assistive device for independence;Needs assistance with ADLs    Vocation Unemployed      Cognition   Overall Cognitive Status Within Functional Limits for tasks assessed      Observation/Other Assessments   Focus on Therapeutic Outcomes (FOTO)  46%  expected improvement to 62%      ROM / Strength   AROM / PROM / Strength AROM;Strength      AROM   Overall AROM Comments Shoulder elvevation to 90 degrees LT 10 degrees RT     AROM Assessment Site Shoulder      Strength   Overall Strength Comments No UE testing as pt said she was to lift no weight per MD     Strength Assessment Site Ankle;Knee;Hip    Right/Left Hip Right;Left    Right Hip Flexion 4+/5    Right Hip External Rotation  4+/5    Right Hip Internal Rotation 4+/5    Left Hip Flexion 4/5    Left Hip External Rotation 4+/5    Left Hip Internal Rotation 4+/5    Right/Left Knee Right;Left    Right Knee Flexion 4+/5    Right Knee Extension 4+/5    Left Knee Flexion 4+/5    Left Knee Extension 4/5    Right/Left Ankle Left;Right    Right Ankle Dorsiflexion 5/5    Right Ankle Inversion 5/5    Right Ankle Eversion 5/5    Left Ankle Dorsiflexion 5/5    Left Ankle Inversion 5/5    Left Ankle Eversion 5/5      Ambulation/Gait   Gait velocity .63 feeet/secd    Gait Comments Walks with RW step through   shorter strides      Standardized Balance Assessment   Standardized Balance Assessment --   SLS need ligt UE support and able to hold 3-4 sec                       Objective measurements completed on examination: See above findings.               PT Education - 10/23/19  1511    Education Details POC reviewed FOTO score and possible improvements expected    Person(s) Educated Patient  Methods Explanation    Comprehension Verbalized understanding            PT Short Term Goals - 10/23/19 1539      PT SHORT TERM GOAL #1   Title She will be independent with initial HEP    Time 4    Period Weeks    Status New      PT SHORT TERM GOAL #2   Title She will walk safely in home with [facility]    Time 4    Period Weeks    Status New      PT SHORT TERM GOAL #3   Title BERG score will impreove to    Time 4    Period Weeks    Status New                     Plan - 10/23/19 1512    Clinical Impression Statement Ms Luchsinger presents 4 days post cervical fusion . She reports she needs collar on full tinme and  is not to lift any weight. She has good LE strength , limited single leg  balance and requires walker for walking at this point    Personal Factors and Comorbidities Past/Current Experience;Comorbidity 1;Time since onset of injury/illness/exacerbation;Comorbidity 2    Comorbidities ORIF LT humerous, ORIF LT hip calcaneal fractrue    Examination-Activity Limitations Locomotion Level;Stand;Lift;Dressing;Self Feeding;Bathing;Stairs    Examination-Participation Restrictions Laundry;Cleaning;Community Activity;Meal Prep;Occupation;Driving    Stability/Clinical Decision Making Evolving/Moderate complexity    Clinical Decision Making Moderate    Rehab Potential Good    PT Frequency 2x / week    PT Duration 12 weeks    PT Treatment/Interventions Therapeutic activities;Therapeutic exercise;Patient/family education;Manual techniques;Moist Heat;Cryotherapy;Neuromuscular re-education;Balance training;Gait training    PT Next Visit Plan Distance walk 6 min,  LE strength /balance  BERG test    Consulted and Agree with Plan of Care Patient           Patient will benefit from skilled therapeutic intervention in order to improve the following deficits and  impairments:  Pain, Difficulty walking, Decreased range of motion, Decreased activity tolerance, Decreased balance, Decreased strength, Increased muscle spasms  Visit Diagnosis: Muscle weakness (generalized)  Chronic bilateral low back pain without sciatica  Cramp and spasm  Difficulty in walking, not elsewhere classified  S/P cervical spinal fusion     Problem List Patient Active Problem List   Diagnosis Date Noted  . Cervical myelopathy ([place]) 10/19/2019  . PAD (peripheral artery disease) ([place]) 08/14/2018  . Calcaneus fracture, left 08/14/2018  . Closed fracture of left proximal humerus 08/14/2018  . Nondisplaced intertrochanteric fracture of left femur, initial encounter for closed fracture ([place]) 08/14/2018  . Coronary artery calcification seen on CAT scan 03/06/2018  . Hyperlipidemia 03/06/2018  . Tobacco abuse 03/06/2018  . Claudication in peripheral vascular disease ([place]) 03/06/2018  . Essential hypertension 03/06/2018    Darrel Hoover  PT 10/23/2019, 3:46 PM  [facility] [address] [place], [place], [address] Phone: [phone]   Fax:  [phone]  Name: JOESPHINE SCHEMM MRN: [id number] Date of Birth: [birth year] PHYSICAL THERAPY DISCHARGE SUMMARY  Visits from Start of Care: 1  Current functional level related to goals / functional outcomes: She canceled all appointments due to feeling better.    Remaining deficits: Unknown as she did not return after eval   Education / Equipment: HEP Plan: Patient agrees to discharge.  Patient goals were not met. Patient is being discharged due  to the patient's request.  ?????   Pearson Forster  PT  11/10/19

## 2019-10-27 ENCOUNTER — Ambulatory Visit: Payer: BC Managed Care – PPO

## 2019-11-04 ENCOUNTER — Ambulatory Visit: Payer: BC Managed Care – PPO

## 2019-11-11 ENCOUNTER — Ambulatory Visit: Payer: BC Managed Care – PPO

## 2019-11-13 ENCOUNTER — Ambulatory Visit: Payer: BC Managed Care – PPO

## 2019-11-27 DIAGNOSIS — G959 Disease of spinal cord, unspecified: Secondary | ICD-10-CM | POA: Diagnosis not present

## 2020-01-28 DIAGNOSIS — Z0000 Encounter for general adult medical examination without abnormal findings: Secondary | ICD-10-CM | POA: Diagnosis not present

## 2020-01-28 DIAGNOSIS — I10 Essential (primary) hypertension: Secondary | ICD-10-CM | POA: Diagnosis not present

## 2020-01-28 DIAGNOSIS — E559 Vitamin D deficiency, unspecified: Secondary | ICD-10-CM | POA: Diagnosis not present

## 2021-02-14 ENCOUNTER — Other Ambulatory Visit: Payer: Self-pay | Admitting: Registered Nurse

## 2021-02-14 DIAGNOSIS — F17200 Nicotine dependence, unspecified, uncomplicated: Secondary | ICD-10-CM

## 2021-03-09 ENCOUNTER — Ambulatory Visit
Admission: RE | Admit: 2021-03-09 | Discharge: 2021-03-09 | Disposition: A | Discharge status: Home / Self Care | Payer: 59 | Source: Ambulatory Visit | Attending: Registered Nurse | Admitting: Registered Nurse

## 2021-03-09 DIAGNOSIS — F17200 Nicotine dependence, unspecified, uncomplicated: Secondary | ICD-10-CM

## 2021-03-12 IMAGING — MG DIGITAL SCREENING BILAT W/ TOMO W/ CAD
8 series · 8 of 24 positions shown · non-contrast
Comparison: Previous exam(s).

CLINICAL DATA: Screening.

EXAM:
DIGITAL SCREENING BILATERAL MAMMOGRAM WITH TOMO AND CAD

[R MLO synth-2D]
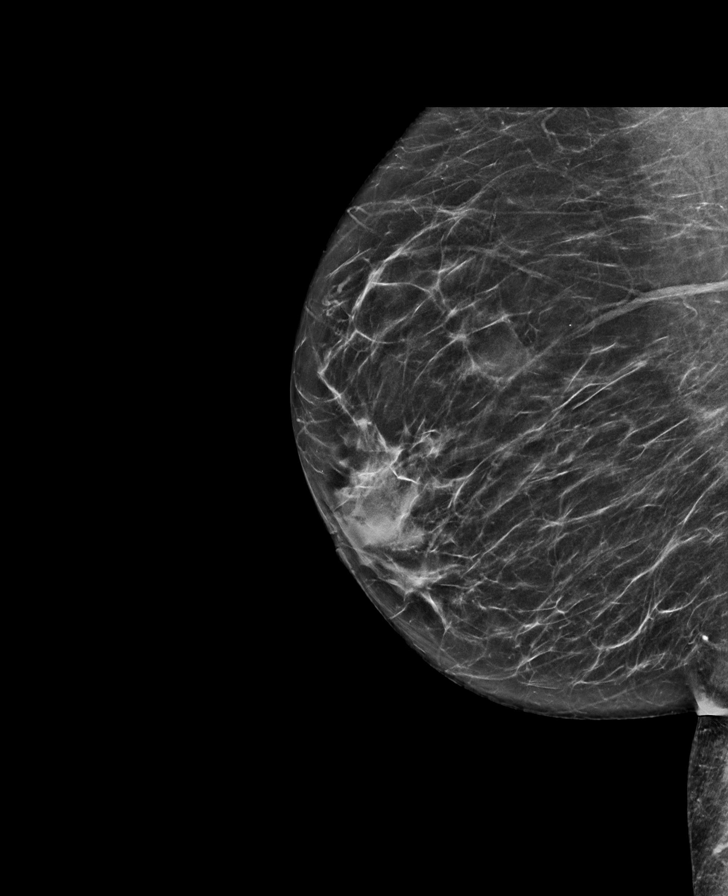

[R CC synth-2D]
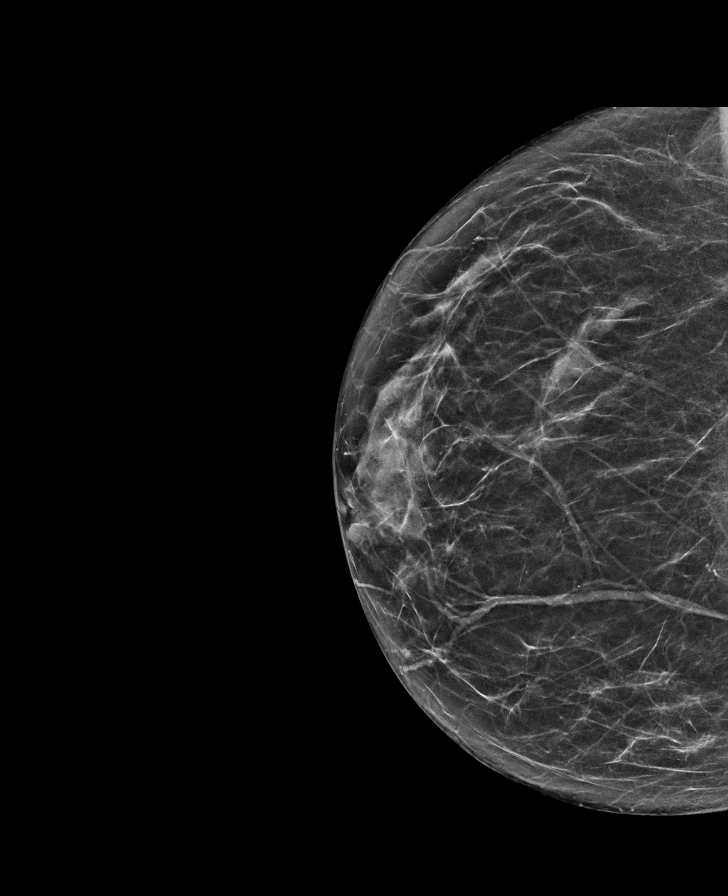

[L CC synth-2D]
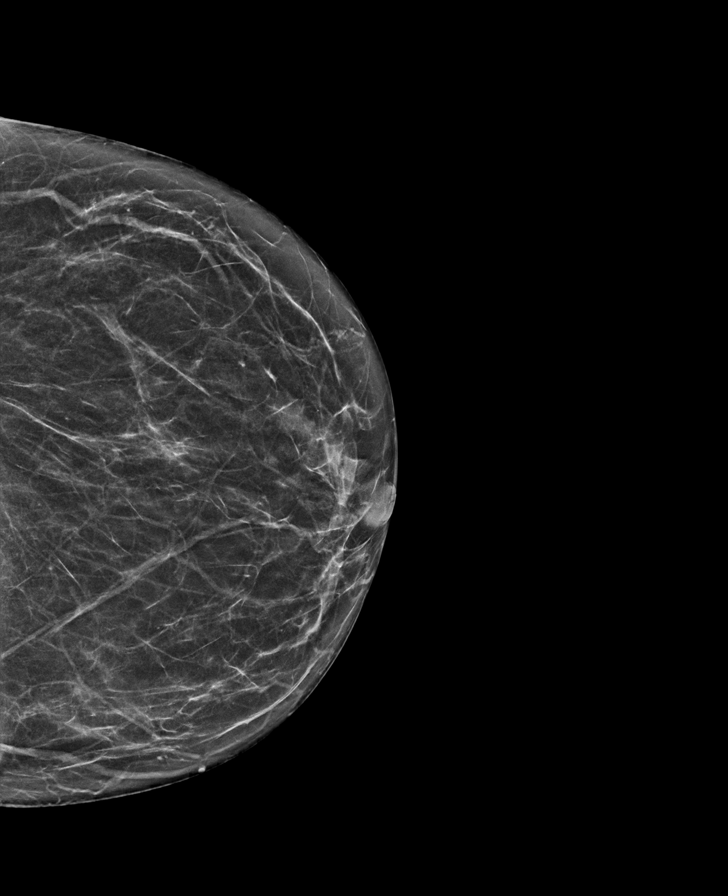

[L MLO synth-2D]
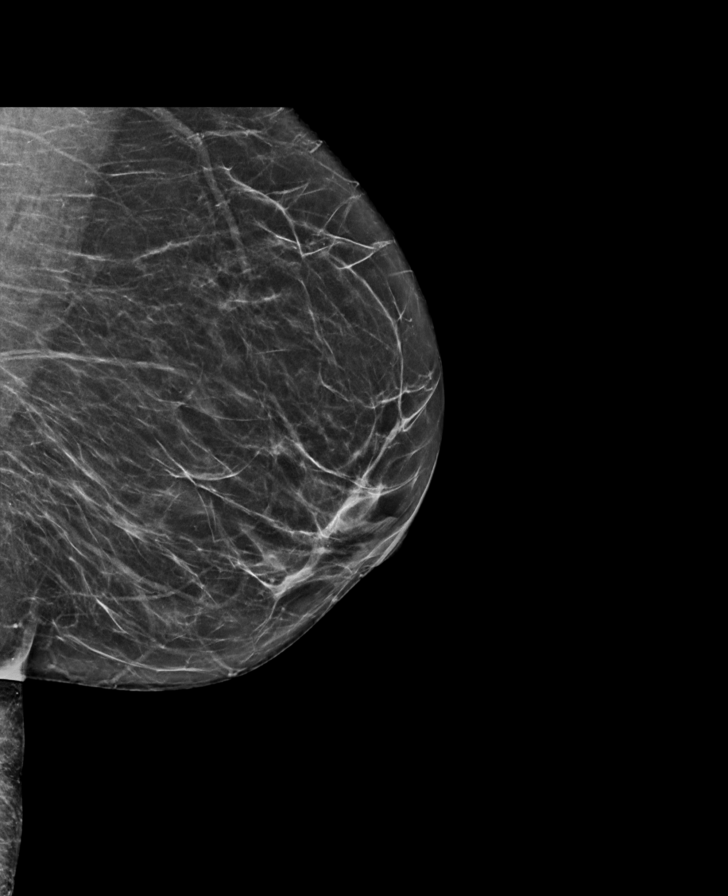

[R CC tomo · tomo slice 29/58.0]
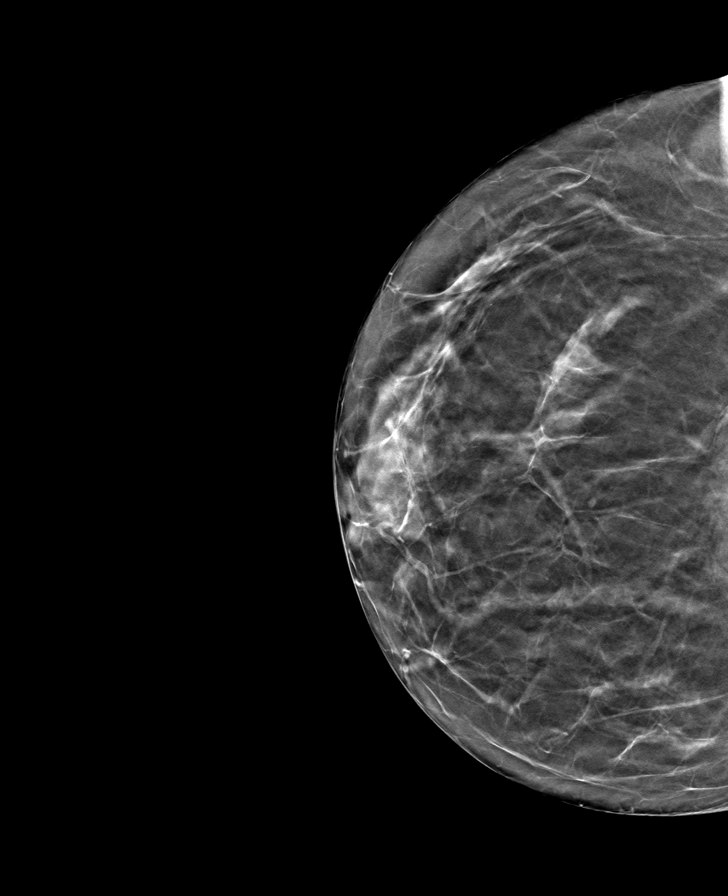

[L MLO tomo · tomo slice 31/61.0]
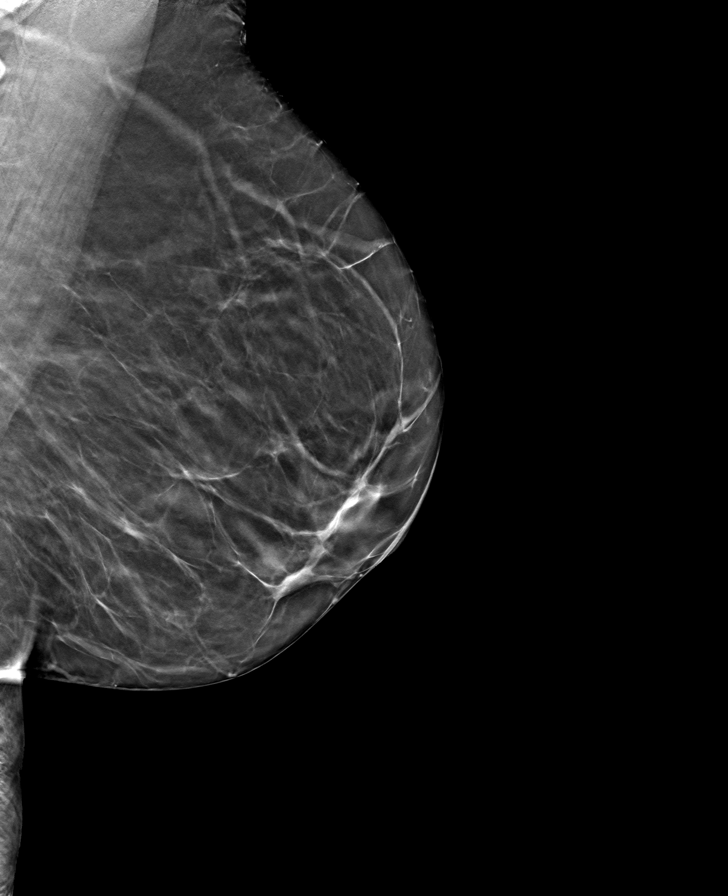

[L CC tomo · tomo slice 29/56.0]
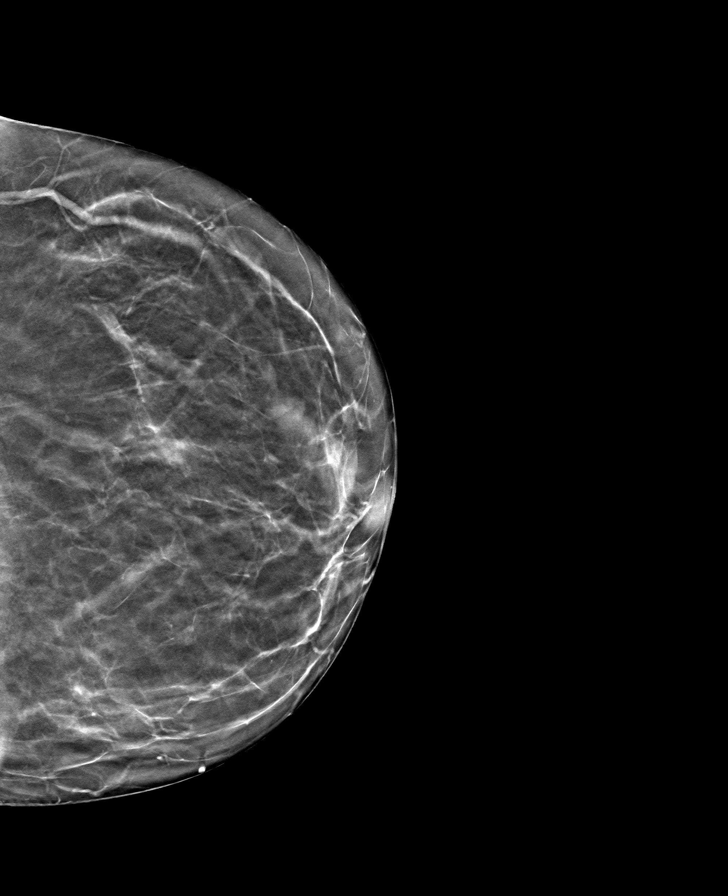

[R MLO tomo · tomo slice 31/62.0]
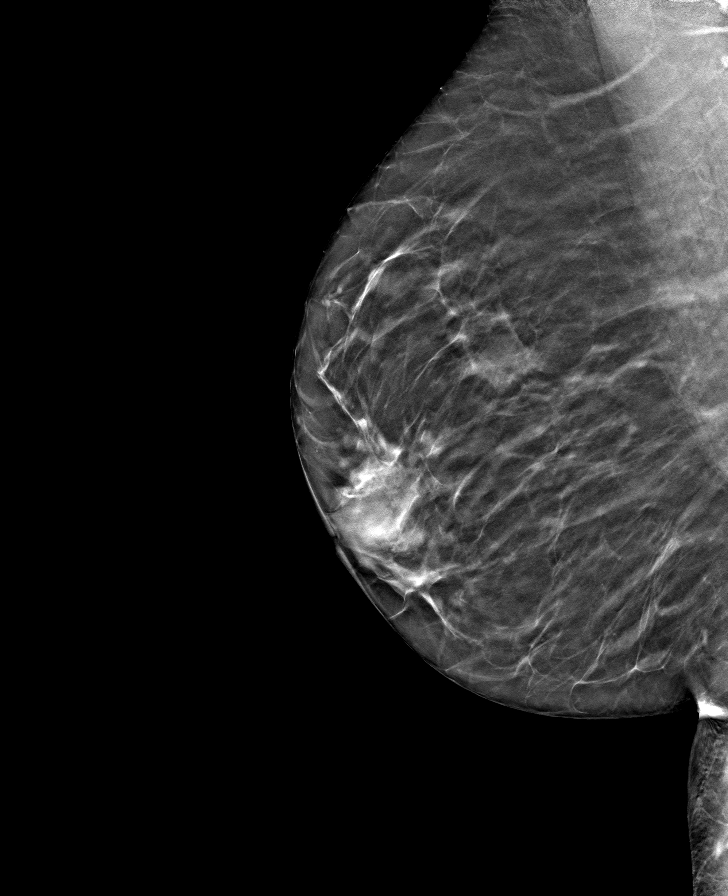

[8 of 24 positions shown; findings below may reference images not displayed]

ACR Breast Density Category b: There are scattered areas of
fibroglandular density.
FINDINGS: There are no findings suspicious for malignancy. Images were
processed with CAD.
IMPRESSION: No mammographic evidence of malignancy. A result letter of this
screening mammogram will be mailed directly to the patient.

RECOMMENDATION:
Screening mammogram in one year. (Code:[id number])

BI-RADS CATEGORY  1: Negative.

## 2021-07-25 ENCOUNTER — Other Ambulatory Visit: Payer: Self-pay | Admitting: Registered Nurse

## 2021-07-25 DIAGNOSIS — N281 Cyst of kidney, acquired: Secondary | ICD-10-CM

## 2021-08-02 DIAGNOSIS — I739 Peripheral vascular disease, unspecified: Secondary | ICD-10-CM | POA: Diagnosis not present

## 2021-08-02 DIAGNOSIS — I25111 Atherosclerotic heart disease of native coronary artery with angina pectoris with documented spasm: Secondary | ICD-10-CM | POA: Diagnosis not present

## 2021-08-11 ENCOUNTER — Other Ambulatory Visit: Payer: Self-pay | Admitting: Internal Medicine

## 2021-08-11 ENCOUNTER — Other Ambulatory Visit: Payer: Self-pay | Admitting: Registered Nurse

## 2021-08-11 DIAGNOSIS — E559 Vitamin D deficiency, unspecified: Secondary | ICD-10-CM | POA: Diagnosis not present

## 2021-08-11 DIAGNOSIS — I10 Essential (primary) hypertension: Secondary | ICD-10-CM | POA: Diagnosis not present

## 2021-08-11 DIAGNOSIS — K219 Gastro-esophageal reflux disease without esophagitis: Secondary | ICD-10-CM | POA: Diagnosis not present

## 2021-08-11 DIAGNOSIS — F17200 Nicotine dependence, unspecified, uncomplicated: Secondary | ICD-10-CM | POA: Diagnosis not present

## 2021-08-11 DIAGNOSIS — I739 Peripheral vascular disease, unspecified: Secondary | ICD-10-CM | POA: Diagnosis not present

## 2021-08-11 DIAGNOSIS — I25111 Atherosclerotic heart disease of native coronary artery with angina pectoris with documented spasm: Secondary | ICD-10-CM | POA: Diagnosis not present

## 2021-08-11 DIAGNOSIS — N281 Cyst of kidney, acquired: Secondary | ICD-10-CM

## 2021-09-12 ENCOUNTER — Ambulatory Visit
Admission: RE | Admit: 2021-09-12 | Discharge: 2021-09-12 | Disposition: A | Discharge status: Home / Self Care | Payer: Medicare PPO | Source: Ambulatory Visit | Attending: Internal Medicine | Admitting: Internal Medicine

## 2021-09-12 DIAGNOSIS — N281 Cyst of kidney, acquired: Secondary | ICD-10-CM

## 2021-09-12 DIAGNOSIS — K8020 Calculus of gallbladder without cholecystitis without obstruction: Secondary | ICD-10-CM | POA: Diagnosis not present

## 2021-09-12 MED ORDER — IOPAMIDOL (ISOVUE-300) INJECTION 61%
100.0000 mL | Freq: Once | INTRAVENOUS | Status: AC | PRN
  Administered 2021-09-12: 100 mL via INTRAVENOUS

## 2021-12-28 DIAGNOSIS — I6782 Cerebral ischemia: Secondary | ICD-10-CM | POA: Diagnosis not present

## 2021-12-28 DIAGNOSIS — J690 Pneumonitis due to inhalation of food and vomit: Secondary | ICD-10-CM | POA: Diagnosis not present

## 2021-12-28 DIAGNOSIS — R4020 Unspecified coma: Secondary | ICD-10-CM | POA: Diagnosis not present

## 2021-12-28 DIAGNOSIS — R9431 Abnormal electrocardiogram [ECG] [EKG]: Secondary | ICD-10-CM | POA: Diagnosis not present

## 2021-12-28 DIAGNOSIS — J9602 Acute respiratory failure with hypercapnia: Secondary | ICD-10-CM | POA: Diagnosis not present

## 2021-12-28 DIAGNOSIS — R404 Transient alteration of awareness: Secondary | ICD-10-CM | POA: Diagnosis not present

## 2021-12-28 DIAGNOSIS — T424X1A Poisoning by benzodiazepines, accidental (unintentional), initial encounter: Secondary | ICD-10-CM | POA: Diagnosis not present

## 2021-12-28 DIAGNOSIS — R410 Disorientation, unspecified: Secondary | ICD-10-CM | POA: Diagnosis not present

## 2021-12-28 DIAGNOSIS — J9601 Acute respiratory failure with hypoxia: Secondary | ICD-10-CM | POA: Diagnosis not present

## 2021-12-28 DIAGNOSIS — T50901A Poisoning by unspecified drugs, medicaments and biological substances, accidental (unintentional), initial encounter: Secondary | ICD-10-CM | POA: Diagnosis not present

## 2021-12-28 DIAGNOSIS — J811 Chronic pulmonary edema: Secondary | ICD-10-CM | POA: Diagnosis not present

## 2021-12-28 DIAGNOSIS — J90 Pleural effusion, not elsewhere classified: Secondary | ICD-10-CM | POA: Diagnosis not present

## 2021-12-28 DIAGNOSIS — J441 Chronic obstructive pulmonary disease with (acute) exacerbation: Secondary | ICD-10-CM | POA: Diagnosis not present

## 2021-12-28 DIAGNOSIS — T40411A Poisoning by fentanyl or fentanyl analogs, accidental (unintentional), initial encounter: Secondary | ICD-10-CM | POA: Diagnosis not present

## 2021-12-28 DIAGNOSIS — R400 Somnolence: Secondary | ICD-10-CM | POA: Diagnosis not present

## 2021-12-28 DIAGNOSIS — J181 Lobar pneumonia, unspecified organism: Secondary | ICD-10-CM | POA: Diagnosis not present

## 2021-12-28 DIAGNOSIS — E440 Moderate protein-calorie malnutrition: Secondary | ICD-10-CM | POA: Diagnosis not present

## 2021-12-28 DIAGNOSIS — R55 Syncope and collapse: Secondary | ICD-10-CM | POA: Diagnosis not present

## 2021-12-28 DIAGNOSIS — G928 Other toxic encephalopathy: Secondary | ICD-10-CM | POA: Diagnosis not present

## 2021-12-28 DIAGNOSIS — E871 Hypo-osmolality and hyponatremia: Secondary | ICD-10-CM | POA: Diagnosis not present

## 2021-12-28 DIAGNOSIS — Z4682 Encounter for fitting and adjustment of non-vascular catheter: Secondary | ICD-10-CM | POA: Diagnosis not present

## 2022-02-08 DIAGNOSIS — E559 Vitamin D deficiency, unspecified: Secondary | ICD-10-CM | POA: Diagnosis not present

## 2022-02-08 DIAGNOSIS — Z0000 Encounter for general adult medical examination without abnormal findings: Secondary | ICD-10-CM | POA: Diagnosis not present

## 2022-02-15 DIAGNOSIS — G8929 Other chronic pain: Secondary | ICD-10-CM | POA: Diagnosis not present

## 2022-02-15 DIAGNOSIS — M25512 Pain in left shoulder: Secondary | ICD-10-CM | POA: Diagnosis not present

## 2022-02-15 DIAGNOSIS — Z7901 Long term (current) use of anticoagulants: Secondary | ICD-10-CM | POA: Diagnosis not present

## 2022-02-15 DIAGNOSIS — E559 Vitamin D deficiency, unspecified: Secondary | ICD-10-CM | POA: Diagnosis not present

## 2022-02-15 DIAGNOSIS — J439 Emphysema, unspecified: Secondary | ICD-10-CM | POA: Diagnosis not present

## 2022-02-15 DIAGNOSIS — F418 Other specified anxiety disorders: Secondary | ICD-10-CM | POA: Diagnosis not present

## 2022-02-15 DIAGNOSIS — Z0000 Encounter for general adult medical examination without abnormal findings: Secondary | ICD-10-CM | POA: Diagnosis not present

## 2022-02-15 DIAGNOSIS — I10 Essential (primary) hypertension: Secondary | ICD-10-CM | POA: Diagnosis not present

## 2022-02-15 DIAGNOSIS — I25111 Atherosclerotic heart disease of native coronary artery with angina pectoris with documented spasm: Secondary | ICD-10-CM | POA: Diagnosis not present

## 2022-08-10 DIAGNOSIS — I25111 Atherosclerotic heart disease of native coronary artery with angina pectoris with documented spasm: Secondary | ICD-10-CM | POA: Diagnosis not present

## 2022-08-10 LAB — LAB REPORT - SCANNED: EGFR: 97

## 2022-08-17 DIAGNOSIS — Z7901 Long term (current) use of anticoagulants: Secondary | ICD-10-CM | POA: Diagnosis not present

## 2022-08-17 DIAGNOSIS — I739 Peripheral vascular disease, unspecified: Secondary | ICD-10-CM | POA: Diagnosis not present

## 2022-08-17 DIAGNOSIS — I25111 Atherosclerotic heart disease of native coronary artery with angina pectoris with documented spasm: Secondary | ICD-10-CM | POA: Diagnosis not present

## 2022-08-17 DIAGNOSIS — J439 Emphysema, unspecified: Secondary | ICD-10-CM | POA: Diagnosis not present

## 2022-08-17 DIAGNOSIS — E559 Vitamin D deficiency, unspecified: Secondary | ICD-10-CM | POA: Diagnosis not present

## 2022-08-17 DIAGNOSIS — F17200 Nicotine dependence, unspecified, uncomplicated: Secondary | ICD-10-CM | POA: Diagnosis not present

## 2022-08-17 DIAGNOSIS — I10 Essential (primary) hypertension: Secondary | ICD-10-CM | POA: Diagnosis not present

## 2022-08-17 DIAGNOSIS — I700 Atherosclerosis of aorta: Secondary | ICD-10-CM | POA: Diagnosis not present

## 2022-08-17 DIAGNOSIS — R634 Abnormal weight loss: Secondary | ICD-10-CM | POA: Diagnosis not present

## 2022-08-22 ENCOUNTER — Other Ambulatory Visit: Payer: Self-pay | Admitting: Registered Nurse

## 2022-08-22 DIAGNOSIS — F17200 Nicotine dependence, unspecified, uncomplicated: Secondary | ICD-10-CM

## 2023-02-22 DIAGNOSIS — R319 Hematuria, unspecified: Secondary | ICD-10-CM | POA: Diagnosis not present

## 2023-02-22 DIAGNOSIS — I10 Essential (primary) hypertension: Secondary | ICD-10-CM | POA: Diagnosis not present

## 2023-02-22 DIAGNOSIS — E559 Vitamin D deficiency, unspecified: Secondary | ICD-10-CM | POA: Diagnosis not present

## 2023-02-23 LAB — LAB REPORT - SCANNED: EGFR: 76

## 2023-03-27 DIAGNOSIS — K219 Gastro-esophageal reflux disease without esophagitis: Secondary | ICD-10-CM | POA: Diagnosis not present

## 2023-03-27 DIAGNOSIS — F17200 Nicotine dependence, unspecified, uncomplicated: Secondary | ICD-10-CM | POA: Diagnosis not present

## 2023-03-27 DIAGNOSIS — I700 Atherosclerosis of aorta: Secondary | ICD-10-CM | POA: Diagnosis not present

## 2023-03-27 DIAGNOSIS — Z23 Encounter for immunization: Secondary | ICD-10-CM | POA: Diagnosis not present

## 2023-03-27 DIAGNOSIS — E559 Vitamin D deficiency, unspecified: Secondary | ICD-10-CM | POA: Diagnosis not present

## 2023-03-27 DIAGNOSIS — Z0000 Encounter for general adult medical examination without abnormal findings: Secondary | ICD-10-CM | POA: Diagnosis not present

## 2023-03-27 DIAGNOSIS — I10 Essential (primary) hypertension: Secondary | ICD-10-CM | POA: Diagnosis not present

## 2023-03-27 DIAGNOSIS — I25111 Atherosclerotic heart disease of native coronary artery with angina pectoris with documented spasm: Secondary | ICD-10-CM | POA: Diagnosis not present

## 2023-03-27 DIAGNOSIS — N390 Urinary tract infection, site not specified: Secondary | ICD-10-CM | POA: Diagnosis not present

## 2023-04-10 ENCOUNTER — Encounter: Payer: Self-pay | Admitting: Registered Nurse

## 2023-04-13 ENCOUNTER — Ambulatory Visit
Admission: RE | Admit: 2023-04-13 | Discharge: 2023-04-13 | Disposition: A | Discharge status: Home / Self Care | Payer: Medicare PPO | Source: Ambulatory Visit | Attending: Registered Nurse | Admitting: Registered Nurse

## 2023-04-13 DIAGNOSIS — F17210 Nicotine dependence, cigarettes, uncomplicated: Secondary | ICD-10-CM | POA: Diagnosis not present

## 2023-04-13 DIAGNOSIS — F17200 Nicotine dependence, unspecified, uncomplicated: Secondary | ICD-10-CM

## 2023-04-25 ENCOUNTER — Encounter: Payer: Self-pay | Admitting: Registered Nurse

## 2023-04-25 ENCOUNTER — Other Ambulatory Visit: Payer: Self-pay | Admitting: Registered Nurse

## 2023-04-25 DIAGNOSIS — N281 Cyst of kidney, acquired: Secondary | ICD-10-CM

## 2023-06-04 DIAGNOSIS — N281 Cyst of kidney, acquired: Secondary | ICD-10-CM | POA: Diagnosis not present

## 2023-06-04 DIAGNOSIS — N390 Urinary tract infection, site not specified: Secondary | ICD-10-CM | POA: Diagnosis not present

## 2023-07-14 ENCOUNTER — Other Ambulatory Visit: Payer: Self-pay | Admitting: Urology

## 2023-07-14 MED ORDER — NITROFURANTOIN MONOHYD MACRO 100 MG PO CAPS: 100.0000 mg | ORAL_CAPSULE | Freq: Two times a day (BID) | ORAL | 0 refills | Status: DC

## 2023-07-24 ENCOUNTER — Other Ambulatory Visit: Payer: Self-pay | Admitting: Urology

## 2023-07-24 DIAGNOSIS — N281 Cyst of kidney, acquired: Secondary | ICD-10-CM

## 2023-07-27 ENCOUNTER — Encounter: Payer: Self-pay | Admitting: Urology

## 2023-08-31 ENCOUNTER — Ambulatory Visit
Admission: RE | Admit: 2023-08-31 | Discharge: 2023-08-31 | Disposition: A | Discharge status: Home / Self Care | Source: Ambulatory Visit | Attending: Urology | Admitting: Urology

## 2023-08-31 DIAGNOSIS — N281 Cyst of kidney, acquired: Secondary | ICD-10-CM

## 2023-08-31 DIAGNOSIS — K760 Fatty (change of) liver, not elsewhere classified: Secondary | ICD-10-CM | POA: Diagnosis not present

## 2023-08-31 DIAGNOSIS — R93422 Abnormal radiologic findings on diagnostic imaging of left kidney: Secondary | ICD-10-CM | POA: Diagnosis not present

## 2023-08-31 DIAGNOSIS — K8020 Calculus of gallbladder without cholecystitis without obstruction: Secondary | ICD-10-CM | POA: Diagnosis not present

## 2023-08-31 DIAGNOSIS — K8689 Other specified diseases of pancreas: Secondary | ICD-10-CM | POA: Diagnosis not present

## 2023-08-31 MED ORDER — GADOPICLENOL 0.5 MMOL/ML IV SOLN
5.0000 mL | Freq: Once | INTRAVENOUS | Status: AC | PRN
  Administered 2023-08-31: 5 mL via INTRAVENOUS

## 2023-09-04 DIAGNOSIS — J40 Bronchitis, not specified as acute or chronic: Secondary | ICD-10-CM | POA: Diagnosis not present

## 2023-09-04 DIAGNOSIS — J441 Chronic obstructive pulmonary disease with (acute) exacerbation: Secondary | ICD-10-CM | POA: Diagnosis not present

## 2023-09-10 DIAGNOSIS — N281 Cyst of kidney, acquired: Secondary | ICD-10-CM | POA: Diagnosis not present

## 2023-09-10 DIAGNOSIS — Q453 Other congenital malformations of pancreas and pancreatic duct: Secondary | ICD-10-CM | POA: Diagnosis not present

## 2023-09-26 DIAGNOSIS — I25111 Atherosclerotic heart disease of native coronary artery with angina pectoris with documented spasm: Secondary | ICD-10-CM | POA: Diagnosis not present

## 2023-09-26 LAB — LAB REPORT - SCANNED: EGFR: 93

## 2023-10-04 ENCOUNTER — Encounter: Payer: Self-pay | Admitting: Registered Nurse

## 2023-10-04 DIAGNOSIS — J441 Chronic obstructive pulmonary disease with (acute) exacerbation: Secondary | ICD-10-CM | POA: Diagnosis not present

## 2023-10-04 DIAGNOSIS — I700 Atherosclerosis of aorta: Secondary | ICD-10-CM | POA: Diagnosis not present

## 2023-10-04 DIAGNOSIS — I10 Essential (primary) hypertension: Secondary | ICD-10-CM | POA: Diagnosis not present

## 2023-10-04 DIAGNOSIS — F17200 Nicotine dependence, unspecified, uncomplicated: Secondary | ICD-10-CM | POA: Diagnosis not present

## 2023-10-04 DIAGNOSIS — J439 Emphysema, unspecified: Secondary | ICD-10-CM | POA: Diagnosis not present

## 2023-10-04 DIAGNOSIS — E559 Vitamin D deficiency, unspecified: Secondary | ICD-10-CM | POA: Diagnosis not present

## 2023-10-04 DIAGNOSIS — I25111 Atherosclerotic heart disease of native coronary artery with angina pectoris with documented spasm: Secondary | ICD-10-CM | POA: Diagnosis not present

## 2023-10-07 ENCOUNTER — Ambulatory Visit: Payer: Self-pay | Admitting: Cardiovascular Disease

## 2023-10-07 DIAGNOSIS — E782 Mixed hyperlipidemia: Secondary | ICD-10-CM

## 2023-10-23 ENCOUNTER — Other Ambulatory Visit: Payer: Self-pay | Admitting: Registered Nurse

## 2023-10-23 DIAGNOSIS — R918 Other nonspecific abnormal finding of lung field: Secondary | ICD-10-CM

## 2023-10-23 NOTE — Telephone Encounter (Signed)
 Patient returned RN's call regarding results.

## 2023-11-13 ENCOUNTER — Ambulatory Visit
Admission: RE | Admit: 2023-11-13 | Discharge: 2023-11-13 | Disposition: A | Discharge status: Home / Self Care | Source: Ambulatory Visit | Attending: Registered Nurse | Admitting: Registered Nurse

## 2023-11-13 DIAGNOSIS — J432 Centrilobular emphysema: Secondary | ICD-10-CM | POA: Diagnosis not present

## 2023-11-13 DIAGNOSIS — R918 Other nonspecific abnormal finding of lung field: Secondary | ICD-10-CM

## 2023-11-28 ENCOUNTER — Emergency Department (HOSPITAL_BASED_OUTPATIENT_CLINIC_OR_DEPARTMENT_OTHER)

## 2023-11-28 ENCOUNTER — Inpatient Hospital Stay (HOSPITAL_BASED_OUTPATIENT_CLINIC_OR_DEPARTMENT_OTHER)
Admission: EM | Admit: 2023-11-28 | Discharge: 2023-11-30 | DRG: 190 | Disposition: A | Discharge status: Home / Self Care | Attending: Family Medicine | Admitting: Family Medicine

## 2023-11-28 ENCOUNTER — Other Ambulatory Visit: Payer: Self-pay

## 2023-11-28 ENCOUNTER — Encounter (HOSPITAL_BASED_OUTPATIENT_CLINIC_OR_DEPARTMENT_OTHER): Payer: Self-pay | Admitting: Emergency Medicine

## 2023-11-28 DIAGNOSIS — Z820 Family history of epilepsy and other diseases of the nervous system: Secondary | ICD-10-CM | POA: Diagnosis not present

## 2023-11-28 DIAGNOSIS — Z981 Arthrodesis status: Secondary | ICD-10-CM

## 2023-11-28 DIAGNOSIS — S8012XA Contusion of left lower leg, initial encounter: Secondary | ICD-10-CM | POA: Diagnosis not present

## 2023-11-28 DIAGNOSIS — F17210 Nicotine dependence, cigarettes, uncomplicated: Secondary | ICD-10-CM | POA: Diagnosis not present

## 2023-11-28 DIAGNOSIS — Z809 Family history of malignant neoplasm, unspecified: Secondary | ICD-10-CM | POA: Diagnosis not present

## 2023-11-28 DIAGNOSIS — J441 Chronic obstructive pulmonary disease with (acute) exacerbation: Secondary | ICD-10-CM | POA: Diagnosis not present

## 2023-11-28 DIAGNOSIS — J9621 Acute and chronic respiratory failure with hypoxia: Secondary | ICD-10-CM | POA: Diagnosis not present

## 2023-11-28 DIAGNOSIS — R0602 Shortness of breath: Secondary | ICD-10-CM | POA: Diagnosis not present

## 2023-11-28 DIAGNOSIS — S9002XA Contusion of left ankle, initial encounter: Secondary | ICD-10-CM | POA: Diagnosis present

## 2023-11-28 DIAGNOSIS — Z79899 Other long term (current) drug therapy: Secondary | ICD-10-CM

## 2023-11-28 DIAGNOSIS — I739 Peripheral vascular disease, unspecified: Secondary | ICD-10-CM | POA: Diagnosis not present

## 2023-11-28 DIAGNOSIS — Z8249 Family history of ischemic heart disease and other diseases of the circulatory system: Secondary | ICD-10-CM | POA: Diagnosis not present

## 2023-11-28 DIAGNOSIS — J439 Emphysema, unspecified: Secondary | ICD-10-CM | POA: Diagnosis not present

## 2023-11-28 DIAGNOSIS — Z7901 Long term (current) use of anticoagulants: Secondary | ICD-10-CM | POA: Diagnosis not present

## 2023-11-28 DIAGNOSIS — J9601 Acute respiratory failure with hypoxia: Secondary | ICD-10-CM | POA: Diagnosis present

## 2023-11-28 DIAGNOSIS — I10 Essential (primary) hypertension: Secondary | ICD-10-CM | POA: Diagnosis present

## 2023-11-28 DIAGNOSIS — E785 Hyperlipidemia, unspecified: Secondary | ICD-10-CM | POA: Diagnosis present

## 2023-11-28 DIAGNOSIS — S9000XA Contusion of unspecified ankle, initial encounter: Secondary | ICD-10-CM

## 2023-11-28 DIAGNOSIS — S99912A Unspecified injury of left ankle, initial encounter: Secondary | ICD-10-CM | POA: Diagnosis not present

## 2023-11-28 DIAGNOSIS — Z7902 Long term (current) use of antithrombotics/antiplatelets: Secondary | ICD-10-CM

## 2023-11-28 DIAGNOSIS — S8011XA Contusion of right lower leg, initial encounter: Secondary | ICD-10-CM | POA: Diagnosis not present

## 2023-11-28 DIAGNOSIS — J40 Bronchitis, not specified as acute or chronic: Secondary | ICD-10-CM | POA: Diagnosis not present

## 2023-11-28 DIAGNOSIS — S9001XA Contusion of right ankle, initial encounter: Secondary | ICD-10-CM | POA: Diagnosis not present

## 2023-11-28 LAB — CBC WITH DIFFERENTIAL/PLATELET
Abs Immature Granulocytes: 0.27 K/uL — ABNORMAL HIGH (ref 0.00–0.07)
Basophils Absolute: 0.1 K/uL (ref 0.0–0.1)
Basophils Relative: 0 %
Eosinophils Absolute: 0.1 K/uL (ref 0.0–0.5)
Eosinophils Relative: 0 %
HCT: 47.6 % — ABNORMAL HIGH (ref 36.0–46.0)
Hemoglobin: 15.3 g/dL — ABNORMAL HIGH (ref 12.0–15.0)
Immature Granulocytes: 1 %
Lymphocytes Relative: 8 %
Lymphs Abs: 1.7 K/uL (ref 0.7–4.0)
MCH: 28.7 pg (ref 26.0–34.0)
MCHC: 32.1 g/dL (ref 30.0–36.0)
MCV: 89.1 fL (ref 80.0–100.0)
Monocytes Absolute: 1.4 K/uL — ABNORMAL HIGH (ref 0.1–1.0)
Monocytes Relative: 7 %
Neutro Abs: 17.5 K/uL — ABNORMAL HIGH (ref 1.7–7.7)
Neutrophils Relative %: 84 %
Platelets: 292 K/uL (ref 150–400)
RBC: 5.34 MIL/uL — ABNORMAL HIGH (ref 3.87–5.11)
RDW: 12.5 % (ref 11.5–15.5)
WBC: 21 K/uL — ABNORMAL HIGH (ref 4.0–10.5)
nRBC: 0 % (ref 0.0–0.2)

## 2023-11-28 LAB — COMPREHENSIVE METABOLIC PANEL WITH GFR
ALT: 24 U/L (ref 0–44)
AST: 33 U/L (ref 15–41)
Albumin: 4.4 g/dL (ref 3.5–5.0)
Alkaline Phosphatase: 59 U/L (ref 38–126)
Anion gap: 15 (ref 5–15)
BUN: 17 mg/dL (ref 8–23)
CO2: 25 mmol/L (ref 22–32)
Calcium: 9.7 mg/dL (ref 8.9–10.3)
Chloride: 102 mmol/L (ref 98–111)
Creatinine, Ser: 0.8 mg/dL (ref 0.44–1.00)
GFR, Estimated: >60 mL/min (ref >60)
Glucose, Bld: 137 mg/dL — ABNORMAL HIGH (ref 70–99)
Potassium: 3.5 mmol/L (ref 3.5–5.1)
Sodium: 142 mmol/L (ref 135–145)
Total Bilirubin: 0.5 mg/dL (ref 0.0–1.2)
Total Protein: 7.7 g/dL (ref 6.5–8.1)

## 2023-11-28 MED ORDER — ALBUTEROL SULFATE HFA 108 (90 BASE) MCG/ACT IN AERS
2.0000 | INHALATION_SPRAY | Freq: Once | RESPIRATORY_TRACT | Status: DC
  Filled 2023-11-28: qty 6.7

## 2023-11-28 MED ORDER — IPRATROPIUM-ALBUTEROL 0.5-2.5 (3) MG/3ML IN SOLN
3.0000 mL | Freq: Once | RESPIRATORY_TRACT | Status: AC
  Administered 2023-11-28: 3 mL via RESPIRATORY_TRACT

## 2023-11-28 MED ORDER — IPRATROPIUM-ALBUTEROL 0.5-2.5 (3) MG/3ML IN SOLN
RESPIRATORY_TRACT | Status: AC
  Filled 2023-11-28: qty 3
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
## 2023-11-28 NOTE — ED Notes (Signed)
 Pt noted to have oxygen saturation of 79% on RA.  Placed on 3L [place] and improved to low 80s.  EDP and RT notified.  RT at bedside to assess.  Pt sats improved on 4L.  Pt reports hx of COPD but no oxygen requirements.
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
## 2023-11-29 ENCOUNTER — Other Ambulatory Visit: Payer: Self-pay

## 2023-11-29 DIAGNOSIS — F17210 Nicotine dependence, cigarettes, uncomplicated: Secondary | ICD-10-CM | POA: Diagnosis present

## 2023-11-29 DIAGNOSIS — S9001XA Contusion of right ankle, initial encounter: Secondary | ICD-10-CM | POA: Diagnosis present

## 2023-11-29 DIAGNOSIS — S9002XA Contusion of left ankle, initial encounter: Secondary | ICD-10-CM | POA: Diagnosis present

## 2023-11-29 DIAGNOSIS — Z8249 Family history of ischemic heart disease and other diseases of the circulatory system: Secondary | ICD-10-CM | POA: Diagnosis not present

## 2023-11-29 DIAGNOSIS — J441 Chronic obstructive pulmonary disease with (acute) exacerbation: Secondary | ICD-10-CM | POA: Diagnosis not present

## 2023-11-29 DIAGNOSIS — Z7901 Long term (current) use of anticoagulants: Secondary | ICD-10-CM | POA: Diagnosis not present

## 2023-11-29 DIAGNOSIS — Z820 Family history of epilepsy and other diseases of the nervous system: Secondary | ICD-10-CM | POA: Diagnosis not present

## 2023-11-29 DIAGNOSIS — I739 Peripheral vascular disease, unspecified: Secondary | ICD-10-CM | POA: Diagnosis present

## 2023-11-29 DIAGNOSIS — E785 Hyperlipidemia, unspecified: Secondary | ICD-10-CM | POA: Diagnosis present

## 2023-11-29 DIAGNOSIS — I10 Essential (primary) hypertension: Secondary | ICD-10-CM | POA: Diagnosis present

## 2023-11-29 DIAGNOSIS — Z809 Family history of malignant neoplasm, unspecified: Secondary | ICD-10-CM | POA: Diagnosis not present

## 2023-11-29 DIAGNOSIS — J9601 Acute respiratory failure with hypoxia: Secondary | ICD-10-CM | POA: Diagnosis present

## 2023-11-29 DIAGNOSIS — Z79899 Other long term (current) drug therapy: Secondary | ICD-10-CM | POA: Diagnosis not present

## 2023-11-29 DIAGNOSIS — Z981 Arthrodesis status: Secondary | ICD-10-CM | POA: Diagnosis not present

## 2023-11-29 DIAGNOSIS — Z7902 Long term (current) use of antithrombotics/antiplatelets: Secondary | ICD-10-CM | POA: Diagnosis not present

## 2023-11-29 MED ORDER — METHYLPREDNISOLONE SODIUM SUCC 125 MG IJ SOLR
125.0000 mg | Freq: Once | INTRAMUSCULAR | Status: AC
  Administered 2023-11-29: 125 mg via INTRAVENOUS
  Filled 2023-11-29: qty 2

## 2023-11-29 MED ORDER — TRAZODONE HCL 50 MG PO TABS
25.0000 mg | ORAL_TABLET | Freq: Every evening | ORAL | Status: DC | PRN
  Filled 2023-11-29: qty 1

## 2023-11-29 MED ORDER — ONDANSETRON HCL 4 MG PO TABS: 4.0000 mg | ORAL_TABLET | Freq: Four times a day (QID) | ORAL | Status: DC | PRN

## 2023-11-29 MED ORDER — ACETAMINOPHEN 325 MG PO TABS: 650.0000 mg | ORAL_TABLET | Freq: Four times a day (QID) | ORAL | Status: DC | PRN

## 2023-11-29 MED ORDER — METHYLPREDNISOLONE SODIUM SUCC 40 MG IJ SOLR
40.0000 mg | Freq: Two times a day (BID) | INTRAMUSCULAR | Status: DC
  Administered 2023-11-29 – 2023-11-30 (×3): 40 mg via INTRAVENOUS
  Filled 2023-11-29 (×3): qty 1

## 2023-11-29 MED ORDER — IPRATROPIUM-ALBUTEROL 0.5-2.5 (3) MG/3ML IN SOLN
3.0000 mL | Freq: Once | RESPIRATORY_TRACT | Status: AC
  Administered 2023-11-29: 3 mL via RESPIRATORY_TRACT
  Filled 2023-11-29: qty 3

## 2023-11-29 MED ORDER — AMLODIPINE BESYLATE 5 MG PO TABS
5.0000 mg | ORAL_TABLET | Freq: Once | ORAL | Status: AC
  Administered 2023-11-29: 5 mg via ORAL
  Filled 2023-11-29: qty 1

## 2023-11-29 MED ORDER — AMLODIPINE BESYLATE 10 MG PO TABS
10.0000 mg | ORAL_TABLET | Freq: Every day | ORAL | Status: DC
  Administered 2023-11-30: 10 mg via ORAL
  Filled 2023-11-29: qty 1

## 2023-11-29 MED ORDER — MORPHINE SULFATE (PF) 4 MG/ML IV SOLN
4.0000 mg | INTRAVENOUS | Status: DC | PRN
  Administered 2023-11-29 (×2): 4 mg via INTRAVENOUS
  Filled 2023-11-29 (×2): qty 1

## 2023-11-29 MED ORDER — MAGNESIUM SULFATE 2 GM/50ML IV SOLN
2.0000 g | Freq: Once | INTRAVENOUS | Status: AC
  Administered 2023-11-29: 2 g via INTRAVENOUS
  Filled 2023-11-29: qty 50

## 2023-11-29 MED ORDER — ONDANSETRON HCL 4 MG/2ML IJ SOLN: 4.0000 mg | Freq: Four times a day (QID) | INTRAMUSCULAR | Status: DC | PRN

## 2023-11-29 MED ORDER — ACETAMINOPHEN 650 MG RE SUPP: 650.0000 mg | Freq: Four times a day (QID) | RECTAL | Status: DC | PRN

## 2023-11-29 MED ORDER — HYDRALAZINE HCL 20 MG/ML IJ SOLN
5.0000 mg | Freq: Four times a day (QID) | INTRAMUSCULAR | Status: DC | PRN
  Administered 2023-11-29: 5 mg via INTRAVENOUS
  Filled 2023-11-29: qty 1

## 2023-11-29 MED ORDER — ROSUVASTATIN CALCIUM 20 MG PO TABS
20.0000 mg | ORAL_TABLET | Freq: Every day | ORAL | Status: DC
  Administered 2023-11-29 – 2023-11-30 (×2): 20 mg via ORAL
  Filled 2023-11-29 (×2): qty 1

## 2023-11-29 MED ORDER — NICOTINE 14 MG/24HR TD PT24
14.0000 mg | MEDICATED_PATCH | Freq: Every day | TRANSDERMAL | Status: DC
  Administered 2023-11-29 – 2023-11-30 (×2): 14 mg via TRANSDERMAL
  Filled 2023-11-29 (×2): qty 1

## 2023-11-29 MED ORDER — ALBUTEROL SULFATE (2.5 MG/3ML) 0.083% IN NEBU: 2.5000 mg | INHALATION_SOLUTION | RESPIRATORY_TRACT | Status: DC | PRN

## 2023-11-29 MED ORDER — IPRATROPIUM-ALBUTEROL 0.5-2.5 (3) MG/3ML IN SOLN: 3.0000 mL | Freq: Four times a day (QID) | RESPIRATORY_TRACT | Status: DC

## 2023-11-29 MED ORDER — MORPHINE SULFATE (PF) 4 MG/ML IV SOLN
4.0000 mg | Freq: Once | INTRAVENOUS | Status: AC
  Administered 2023-11-29: 4 mg via INTRAVENOUS
  Filled 2023-11-29: qty 1

## 2023-11-29 MED ORDER — LEVALBUTEROL HCL 0.63 MG/3ML IN NEBU
0.6300 mg | INHALATION_SOLUTION | Freq: Three times a day (TID) | RESPIRATORY_TRACT | Status: DC
  Administered 2023-11-29 – 2023-11-30 (×3): 0.63 mg via RESPIRATORY_TRACT
  Filled 2023-11-29 (×3): qty 3

## 2023-11-29 MED ORDER — AMLODIPINE BESYLATE 5 MG PO TABS
5.0000 mg | ORAL_TABLET | Freq: Every day | ORAL | Status: DC
  Administered 2023-11-29: 5 mg via ORAL
  Filled 2023-11-29: qty 1

## 2023-11-29 MED ORDER — MORPHINE SULFATE (PF) 2 MG/ML IV SOLN
2.0000 mg | INTRAVENOUS | Status: DC | PRN
  Administered 2023-11-29 – 2023-11-30 (×3): 2 mg via INTRAVENOUS
  Filled 2023-11-29 (×3): qty 1

## 2023-11-29 MED ORDER — ENOXAPARIN SODIUM 40 MG/0.4ML IJ SOSY
40.0000 mg | PREFILLED_SYRINGE | INTRAMUSCULAR | Status: DC
  Administered 2023-11-29 – 2023-11-30 (×2): 40 mg via SUBCUTANEOUS
  Filled 2023-11-29 (×2): qty 0.4

## 2023-11-29 MED ORDER — IPRATROPIUM BROMIDE 0.02 % IN SOLN
0.5000 mg | Freq: Four times a day (QID) | RESPIRATORY_TRACT | Status: DC
  Administered 2023-11-29 (×2): 0.5 mg via RESPIRATORY_TRACT
  Filled 2023-11-29 (×2): qty 2.5

## 2023-11-29 MED ORDER — OXYCODONE HCL 5 MG PO TABS
10.0000 mg | ORAL_TABLET | ORAL | Status: DC | PRN
  Administered 2023-11-29 – 2023-11-30 (×3): 10 mg via ORAL
  Filled 2023-11-29 (×3): qty 2

## 2023-11-29 MED ORDER — LEVALBUTEROL HCL 0.63 MG/3ML IN NEBU
0.6300 mg | INHALATION_SOLUTION | Freq: Four times a day (QID) | RESPIRATORY_TRACT | Status: DC
  Administered 2023-11-29 (×2): 0.63 mg via RESPIRATORY_TRACT
  Filled 2023-11-29 (×2): qty 3

## 2023-11-29 MED ORDER — IPRATROPIUM BROMIDE 0.02 % IN SOLN
0.5000 mg | Freq: Three times a day (TID) | RESPIRATORY_TRACT | Status: DC
  Administered 2023-11-29 – 2023-11-30 (×3): 0.5 mg via RESPIRATORY_TRACT
  Filled 2023-11-29 (×3): qty 2.5

## 2023-11-29 MED ORDER — CLOPIDOGREL BISULFATE 75 MG PO TABS
75.0000 mg | ORAL_TABLET | Freq: Every day | ORAL | Status: DC
  Administered 2023-11-29 – 2023-11-30 (×2): 75 mg via ORAL
  Filled 2023-11-29 (×2): qty 1

## 2023-11-29 MED ORDER — LABETALOL HCL 5 MG/ML IV SOLN
10.0000 mg | INTRAVENOUS | Status: DC | PRN
  Administered 2023-11-29 – 2023-11-30 (×3): 10 mg via INTRAVENOUS
  Filled 2023-11-29 (×3): qty 4
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
## 2023-11-29 NOTE — Progress Notes (Signed)
 Report has been given to [facility] RT 4th floor.
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
## 2023-11-30 ENCOUNTER — Other Ambulatory Visit (HOSPITAL_COMMUNITY): Payer: Self-pay

## 2023-11-30 DIAGNOSIS — J441 Chronic obstructive pulmonary disease with (acute) exacerbation: Secondary | ICD-10-CM | POA: Diagnosis not present

## 2023-11-30 LAB — BASIC METABOLIC PANEL WITH GFR
Anion gap: 12 (ref 5–15)
BUN: 26 mg/dL — ABNORMAL HIGH (ref 8–23)
CO2: 25 mmol/L (ref 22–32)
Calcium: 9.2 mg/dL (ref 8.9–10.3)
Chloride: 102 mmol/L (ref 98–111)
Creatinine, Ser: 0.63 mg/dL (ref 0.44–1.00)
GFR, Estimated: >60 mL/min (ref >60)
Glucose, Bld: 125 mg/dL — ABNORMAL HIGH (ref 70–99)
Potassium: 3.7 mmol/L (ref 3.5–5.1)
Sodium: 140 mmol/L (ref 135–145)

## 2023-11-30 LAB — CBC
HCT: 45.7 % (ref 36.0–46.0)
Hemoglobin: 14.7 g/dL (ref 12.0–15.0)
MCH: 29 pg (ref 26.0–34.0)
MCHC: 32.2 g/dL (ref 30.0–36.0)
MCV: 90.1 fL (ref 80.0–100.0)
Platelets: 262 K/uL (ref 150–400)
RBC: 5.07 MIL/uL (ref 3.87–5.11)
RDW: 12.5 % (ref 11.5–15.5)
WBC: 15.2 K/uL — ABNORMAL HIGH (ref 4.0–10.5)
nRBC: 0 % (ref 0.0–0.2)

## 2023-11-30 LAB — HIV ANTIBODY (ROUTINE TESTING W REFLEX): HIV Screen 4th Generation wRfx: NONREACTIVE

## 2023-11-30 MED ORDER — PREDNISONE 10 MG PO TABS
ORAL_TABLET | ORAL | 0 refills | Status: AC
  Filled 2023-11-30: qty 14, 6d supply, fill #0

## 2023-11-30 NOTE — Progress Notes (Signed)
 SATURATION QUALIFICATIONS: (This note is used to comply with regulatory documentation for home oxygen)  Patient Saturations on Room Air at Rest = 94%  Patient Saturations on Room Air while Ambulating = 92%  Please briefly explain why patient needs home oxygen: not needed, patient O2 sats remain at or above 92% with ambulation.

## 2023-11-30 NOTE — TOC Transition Note (Signed)
 Transition of Care [facility]) - Discharge Note   Patient Details  Name: Cynthia Hobbs MRN: [id number] Date of Birth: 1955/12/10  [facility] CM/SW Contact:  Bascom Service, RN Phone Number: 11/30/2023, 11:45 AM   Clinical Narrative: d/c home no CM needs.      Final next level of care: Home/Self Care Barriers to Discharge: No Barriers Identified   Patient Goals and CMS Choice Patient states their goals for this hospitalization and ongoing recovery are:: Home          Discharge Placement                       Discharge Plan and Services Additional resources added to the After Visit Summary for                                       Social Drivers of Health (SDOH) Interventions SDOH Screenings   Food Insecurity: No Food Insecurity (11/29/2023)  Housing: Low Risk  (11/29/2023)  Transportation Needs: No Transportation Needs (11/29/2023)  Utilities: Not At Risk (11/29/2023)  Social Connections: Patient Declined (11/29/2023)  Tobacco Use: High Risk (11/28/2023)     Readmission Risk Interventions     No data to display
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
## 2023-12-03 ENCOUNTER — Telehealth: Payer: Self-pay

## 2023-12-03 NOTE — Patient Instructions (Signed)
 Visit Information  Thank you for taking time to visit with me today. Please don't hesitate to contact me if I can be of assistance to you before our next scheduled telephone appointment.  Our next appointment is by telephone on 12/11/23 at 1030 am  Following is a copy of your care plan:   Goals Addressed             This Visit's Progress    VBCI Transitions of Care (TOC) Care Plan       Problems:  Recent Hospitalization for treatment of COPD Knowledge Deficit Related to COPD and HTN   Goal:  Over the next 30 days, the patient will not experience hospital readmission  Interventions:  Transitions of Care: Doctor Visits  - discussed the importance of doctor visits Discussed importance of HTN medication adherence and monitoring.  Patient reports prior to hospitalization she had not been taking blood pressure medications.    COPD Interventions: Advised patient to track and manage COPD triggers Advised patient to self assesses COPD action plan zone and make appointment with provider if in the yellow zone for 48 hours without improvement Discussed the importance of adequate rest and management of fatigue with COPD Provided education about and advised patient to utilize infection prevention strategies to reduce risk of respiratory infection Provided instruction about proper use of medications used for management of COPD including inhalers Importance of quitting smoking- per patient no cigarettes in 1 week.  Patient Self Care Activities:  Call pharmacy for medication refills 3-7 days in advance of running out of medications Call provider office for new concerns or questions  Notify RN Care Manager of TOC call rescheduling needs Participate in Transition of Care Program/Attend TOC scheduled calls Take medications as prescribed   eliminate smoking in my home eliminate symptom triggers at home follow rescue plan if symptoms flare-up keep follow-up appointments: Call for follow up with  PCP Take blood pressure medication as prescribed.  Obtain blood pressure monitor Wear boot to left foot as advised  Plan:  The patient has been provided with contact information for the care management team and has been advised to call with any health related questions or concerns.         Patient verbalizes understanding of instructions and care plan provided today and agrees to view in MyChart. Active MyChart status and patient understanding of how to access instructions and care plan via MyChart confirmed with patient.     The patient has been provided with contact information for the care management team and has been advised to call with any health related questions or concerns.   Please call the care guide team at [phone] if you need to cancel or reschedule your appointment.   Please call the Suicide and Crisis Lifeline: 988 if you are experiencing a Mental Health or Behavioral Health Crisis or need someone to talk to.  Yarima Penman J. Katti Pelle RN, MSN [facility], [facility] RN Care Manager Direct Dial: [phone]  Fax: [phone] Website: [email or web address]

## 2023-12-03 NOTE — Transitions of Care (Post Inpatient/ED Visit) (Signed)
 12/03/2023  Name: Cynthia Hobbs MRN: [id number] DOB: July 17, 1955  Today's TOC FU Call Status: Today's TOC FU Call Status:: Successful TOC FU Call Completed TOC FU Call Complete Date: 12/03/23 Patient's Name and Date of Birth confirmed.  Transition Care Management Follow-up Telephone Call Date of Discharge: 11/30/23 Discharge Facility: [facility]) Type of Discharge: Inpatient Admission Primary Inpatient Discharge Diagnosis:: Acute exacerbation of chronic obstructive pulmonary disease How have you been since you were released from the hospital?: Better Any questions or concerns?: No  Items Reviewed: Did you receive and understand the discharge instructions provided?: Yes Medications obtained,verified, and reconciled?: Yes (Medications Reviewed) Any new allergies since your discharge?: No Dietary orders reviewed?: Yes Type of Diet Ordered:: Heart Healthy Do you have support at home?: Yes People in Home [RPT]: parent(s), child(ren), adult  Medications Reviewed Today: Medications Reviewed Today     Reviewed by Krisna Omar, RN (Case Manager) on 12/03/23 at 1049  Med List Status: <None>   Medication Order Taking? Sig Documenting Provider Last Dose Status Informant  albuterol  (VENTOLIN  HFA) 108 (90 Base) MCG/ACT inhaler [id number]  Inhale 1 puff into the lungs every 6 (six) hours as needed for shortness of breath or wheezing. [provider]  Active   amLODipine  (NORVASC ) 5 MG tablet [id number]  Take 5 mg by mouth daily.  Patient not taking: Reported on 11/30/2023   [provider]  Active Self  Cholecalciferol  (VITAMIN D3) 50 MCG (2000 UT) TABS [id number]  Take 2,000 Units by mouth daily. [provider]  Active Self  clopidogrel  (PLAVIX ) 75 MG tablet [id number]  Take 1 tablet (75 mg total) by mouth daily with breakfast. McDaniel, Jill D, NP  Active Self  pantoprazole  (PROTONIX ) 40 MG tablet [id number]  Take 1 tablet by mouth once daily Berry, Jonathan J, MD   Active Self  predniSONE  (DELTASONE ) 10 MG tablet [id number] Yes Take 4 tablets (40 mg total) by mouth daily for 2 days, THEN 2 tablets (20 mg total) daily for 2 days, THEN 1 tablet (10 mg total) daily for 2 days. [name], MD  Active   Rosuvastatin  Calcium  20 MG CPSP [id number]  Take 20 mg by mouth daily. [provider]  Active Self  TRELEGY ELLIPTA 100-62.5-25 MCG/ACT AEPB [id number]  Inhale 1 puff into the lungs daily. [provider]  Active             Home Care and Equipment/Supplies: Were Home Health Services Ordered?: NA Any new equipment or medical supplies ordered?: NA  Functional Questionnaire: Do you need assistance with bathing/showering or dressing?: No Do you need assistance with meal preparation?: No Do you need assistance with eating?: No Do you have difficulty maintaining continence: No Do you need assistance with getting out of bed/getting out of a chair/moving?: No Do you have difficulty managing or taking your medications?: No  Follow up appointments reviewed: PCP Follow-up appointment confirmed?: No (Patient states she will call today) MD Provider Line Number:[phone] Given: No Specialist Hospital Follow-up appointment confirmed?: NA Do you need transportation to your follow-up appointment?: No Do you understand care options if your condition(s) worsen?: Yes-patient verbalized understanding  SDOH Interventions Today    Flowsheet Row Most Recent Value  SDOH Interventions   Food Insecurity Interventions Intervention Not Indicated  Housing Interventions Intervention Not Indicated  Transportation Interventions Intervention Not Indicated  Utilities Interventions Intervention Not Indicated   Discussed and offered 30 day TOC program.  Patient  agreeable to program.  The patient has been  provided with contact information for the care management team and has been advised to call with any health -related questions or concerns.  The  patient verbalized understanding with current plan of care.  The patient is directed to their insurance card regarding availability of benefits coverage.   [name] J. Narcisa Ganesh RN, MSN [facility], [facility] Health RN Care Manager Direct Dial: [phone]  Fax: [phone] Website: [email or web address]

## 2023-12-11 ENCOUNTER — Telehealth: Payer: Self-pay

## 2023-12-12 ENCOUNTER — Telehealth: Payer: Self-pay

## 2023-12-12 NOTE — Patient Instructions (Signed)
 Visit Information  Thank you for taking time to visit with me today. Please don't hesitate to contact me if I can be of assistance to you before our next scheduled telephone appointment.  Our next appointment is by telephone on 12/18/23 at 1030 am  Following is a copy of your care plan:   Goals Addressed             This Visit's Progress    VBCI Transitions of Care (TOC) Care Plan       Problems:  Recent Hospitalization for treatment of COPD Knowledge Deficit Related to COPD and HTN   Goal:  Over the next 30 days, the patient will not experience hospital readmission  Interventions:  Transitions of Care: Doctor Visits  - discussed the importance of doctor visits Discussed importance of HTN medication adherence. Patient reports taking blood pressure medications    COPD Interventions: Advised patient to track and manage COPD triggers Advised patient to self assesses COPD action plan zone and make appointment with provider if in the yellow zone for 48 hours without improvement Discussed the importance of adequate rest and management of fatigue with COPD Provided education about and advised patient to utilize infection prevention strategies to reduce risk of respiratory infection Provided instruction about proper use of medications used for management of COPD including inhalers Importance of quitting smoking- per patient no cigarettes since recent admission.  Congratulated patient and encouraged to continue to not smoke and avoid second hand smoke as well.    Patient Self Care Activities:  Call pharmacy for medication refills 3-7 days in advance of running out of medications Call provider office for new concerns or questions  Notify RN Care Manager of TOC call rescheduling needs Participate in Transition of Care Program/Attend TOC scheduled calls Take medications as prescribed   eliminate smoking in my home eliminate symptom triggers at home follow rescue plan if symptoms  flare-up keep follow-up appointments: Seen by PCP 9-23 Take blood pressure medication as prescribed.    Plan:  The patient has been provided with contact information for the care management team and has been advised to call with any health related questions or concerns.         Patient verbalizes understanding of instructions and care plan provided today and agrees to view in MyChart. Active MyChart status and patient understanding of how to access instructions and care plan via MyChart confirmed with patient.     The patient has been provided with contact information for the care management team and has been advised to call with any health related questions or concerns.   Please call the care guide team at [phone] if you need to cancel or reschedule your appointment.   Please call the Suicide and Crisis Lifeline: 988 if you are experiencing a Mental Health or Behavioral Health Crisis or need someone to talk to.  Ambar Raphael J. Laurieann Friddle RN, MSN [facility], [facility] Health RN Care Manager Direct Dial: [phone]  Fax: [phone] Website: [email or web address]

## 2023-12-12 NOTE — Patient Outreach (Signed)
 Transition of Care week 2  Visit Note  12/12/2023  Name: Cynthia Hobbs MRN: [id number]          DOB: 11-20-1955  Situation: Patient enrolled in [facility] 30-day program. Visit completed with patient by telephone.   Background:   Initial Transition Care Management Follow-up Telephone Call    Past Medical History:  Diagnosis Date   GERD (gastroesophageal reflux disease)    Hypertension    PAD (peripheral artery disease)     Assessment: Patient Reported Symptoms: Cognitive Cognitive Status: Alert and oriented to person, place, and time, Normal speech and language skills      Neurological Neurological Review of Symptoms: No symptoms reported    HEENT HEENT Symptoms Reported: No symptoms reported      Cardiovascular Cardiovascular Symptoms Reported: No symptoms reported Does patient have uncontrolled Hypertension?: Yes Is patient checking Blood Pressure at home?: No Patient's Recent BP reading at home: Patient reports taking medication as prescribed Cardiovascular Management Strategies: Activity, Medication therapy, Routine screening  Respiratory Respiratory Symptoms Reported: No symptoms reported Other Respiratory Symptoms: COPD recent exacerbation Respiratory Management Strategies: Medication therapy  Endocrine Endocrine Symptoms Reported: No symptoms reported    Gastrointestinal Gastrointestinal Symptoms Reported: No symptoms reported      Genitourinary Genitourinary Symptoms Reported: No symptoms reported    Integumentary Integumentary Symptoms Reported: Bruising, Other Other Integumentary Symptoms: Bruising and scrapes to legs healing. No s/s of infection Skin Management Strategies: Routine screening Skin Self-Management Outcome: 4 (good)  Musculoskeletal Musculoskelatal Symptoms Reviewed: Limited mobility Additional Musculoskeletal Details: Patient reports ankle is better and not wearin boot any longer. Musculoskeletal Management Strategies: Routine  screening Musculoskeletal Self-Management Outcome: 4 (good)      Psychosocial Psychosocial Symptoms Reported: No symptoms reported         There were no vitals filed for this visit.  Medications Reviewed Today     Reviewed by Sayward Horvath, RN (Case Manager) on 12/12/23 at 1328  Med List Status: <None>   Medication Order Taking? Sig Documenting Provider Last Dose Status Informant  albuterol  (VENTOLIN  HFA) 108 (90 Base) MCG/ACT inhaler [id number] Yes Inhale 1 puff into the lungs every 6 (six) hours as needed for shortness of breath or wheezing. [provider]  Active   amLODipine  (NORVASC ) 5 MG tablet [id number] Yes Take 5 mg by mouth daily. [provider]  Active Self  Cholecalciferol  (VITAMIN D3) 50 MCG (2000 UT) TABS [id number] Yes Take 2,000 Units by mouth daily. [provider]  Active Self  clopidogrel  (PLAVIX ) 75 MG tablet [id number] Yes Take 1 tablet (75 mg total) by mouth daily with breakfast. McDaniel, Jill D, NP  Active Self  pantoprazole  (PROTONIX ) 40 MG tablet [id number] Yes Take 1 tablet by mouth once daily Court Dorn PARAS, MD  Active Self  Rosuvastatin  Calcium  20 MG CPSP [id number] Yes Take 20 mg by mouth daily. [provider]  Active Self  TRELEGY ELLIPTA 100-62.5-25 MCG/ACT AEPB [id number] Yes Inhale 1 puff into the lungs daily. [provider]  Active             Goals Addressed             This Visit's Progress    VBCI Transitions of Care (TOC) Care Plan       Problems:  Recent Hospitalization for treatment of COPD Knowledge Deficit Related to COPD and HTN   Goal:  Over the next 30 days, the patient will not experience hospital readmission  Interventions:  Transitions of Care: Doctor Visits  -  discussed the importance of doctor visits Discussed importance of HTN medication adherence. Patient reports taking blood pressure medications    COPD Interventions: Advised patient to track and manage COPD  triggers Advised patient to self assesses COPD action plan zone and make appointment with provider if in the yellow zone for 48 hours without improvement Discussed the importance of adequate rest and management of fatigue with COPD Provided education about and advised patient to utilize infection prevention strategies to reduce risk of respiratory infection Provided instruction about proper use of medications used for management of COPD including inhalers Importance of quitting smoking- per patient no cigarettes since recent admission.  Congratulated patient and encouraged to continue to not smoke and avoid second hand smoke as well.    Patient Self Care Activities:  Call pharmacy for medication refills 3-7 days in advance of running out of medications Call provider office for new concerns or questions  Notify RN Care Manager of TOC call rescheduling needs Participate in Transition of Care Program/Attend TOC scheduled calls Take medications as prescribed   eliminate smoking in my home eliminate symptom triggers at home follow rescue plan if symptoms flare-up keep follow-up appointments: Seen by PCP 9-23 Take blood pressure medication as prescribed.    Plan:  The patient has been provided with contact information for the care management team and has been advised to call with any health related questions or concerns.         Recommendation:   Continue Current Plan of Care  Follow Up Plan:   Telephone follow-up in 1 week  Andoni Busch J. Agustine Rossitto RN, MSN [facility], [facility] RN Care Manager Direct Dial: [phone]  Fax: [phone] Website: [email or web address]

## 2023-12-18 ENCOUNTER — Other Ambulatory Visit: Payer: Self-pay

## 2023-12-18 NOTE — Patient Instructions (Signed)
 Visit Information  Thank you for taking time to visit with me today. Please don't hesitate to contact me if I can be of assistance to you before our next scheduled telephone appointment.  Our next appointment is by telephone on 12/25/23 at 1030 am  Following is a copy of your care plan:   Goals Addressed             This Visit's Progress    VBCI Transitions of Care (TOC) Care Plan       Problems:  Recent Hospitalization for treatment of COPD Knowledge Deficit Related to COPD and HTN   Goal:  Over the next 30 days, the patient will not experience hospital readmission  Interventions:  Transitions of Care: Doctor Visits  - discussed the importance of doctor visits Discussed importance of HTN medication adherence.  COPD Interventions: Advised patient to track and manage COPD triggers Advised patient to self assesses COPD action plan zone and make appointment with provider if in the yellow zone for 48 hours without improvement Discussed the importance of adequate rest and management of fatigue with COPD Provided education about and advised patient to utilize infection prevention strategies to reduce risk of respiratory infection Provided instruction about proper use of medications used for management of COPD including inhalers Reviewed the importance of quitting smoking- per patient no cigarettes since recent admission.      Patient Self Care Activities:  Call pharmacy for medication refills 3-7 days in advance of running out of medications Call provider office for new concerns or questions  Notify RN Care Manager of TOC call rescheduling needs Participate in Transition of Care Program/Attend TOC scheduled calls Take medications as prescribed   eliminate smoking in my home eliminate symptom triggers at home follow rescue plan if symptoms flare-up Take blood pressure medication as prescribed.    Plan:  The patient has been provided with contact information for the care  management team and has been advised to call with any health related questions or concerns.         Patient verbalizes understanding of instructions and care plan provided today and agrees to view in MyChart. Active MyChart status and patient understanding of how to access instructions and care plan via MyChart confirmed with patient.     The patient has been provided with contact information for the care management team and has been advised to call with any health related questions or concerns.   Please call the care guide team at [phone] if you need to cancel or reschedule your appointment.   Please call the Suicide and Crisis Lifeline: 988 call the [place]  National Suicide Prevention Lifeline: 305-284-1872 or TTY: 762-237-2334 TTY 7147375842) to talk to a trained counselor if you are experiencing a Mental Health or Behavioral Health Crisis or need someone to talk to.  Orean Giarratano J. Shanard Treto RN, MSN [facility], [facility] Health RN Care Manager Direct Dial: [phone]  Fax: [phone] Website: [email or web address]

## 2023-12-18 NOTE — Transitions of Care (Post Inpatient/ED Visit) (Signed)
 Transition of Care week 3  Visit Note  12/18/2023  Name: Cynthia Hobbs MRN: [id number]          DOB: 11/17/1955  Situation: Patient enrolled in [facility] 30-day program. Visit completed with patient by telephone.   Background:   Initial Transition Care Management Follow-up Telephone Call    Past Medical History:  Diagnosis Date   GERD (gastroesophageal reflux disease)    Hypertension    PAD (peripheral artery disease)     Assessment: Patient Reported Symptoms: Cognitive Cognitive Status: Alert and oriented to person, place, and time, Normal speech and language skills      Neurological Neurological Review of Symptoms: No symptoms reported    HEENT HEENT Symptoms Reported: No symptoms reported      Cardiovascular Cardiovascular Symptoms Reported: No symptoms reported Does patient have uncontrolled Hypertension?: Yes Is patient checking Blood Pressure at home?: No Cardiovascular Management Strategies: Medication therapy, Activity, Routine screening  Respiratory Respiratory Symptoms Reported: No symptoms reported Other Respiratory Symptoms: COPD recent exacerbation Oxygen saturations 95% Respiratory Management Strategies: Routine screening, Medication therapy  Endocrine Endocrine Symptoms Reported: No symptoms reported    Gastrointestinal Gastrointestinal Symptoms Reported: No symptoms reported      Genitourinary Genitourinary Symptoms Reported: No symptoms reported    Integumentary Integumentary Symptoms Reported: Bruising Other Integumentary Symptoms: Bruising fading per patient Skin Management Strategies: Routine screening Skin Self-Management Outcome: 4 (good)  Musculoskeletal Musculoskelatal Symptoms Reviewed: No symptoms reported        Psychosocial Psychosocial Symptoms Reported: No symptoms reported         There were no vitals filed for this visit.  Medications Reviewed Today     Reviewed by Jeremie Abdelaziz, RN (Case Manager) on 12/18/23 at 1037  Med List  Status: <None>   Medication Order Taking? Sig Documenting Provider Last Dose Status Informant  albuterol  (VENTOLIN  HFA) 108 (90 Base) MCG/ACT inhaler [id number] Yes Inhale 1 puff into the lungs every 6 (six) hours as needed for shortness of breath or wheezing. [provider]  Active   amLODipine  (NORVASC ) 5 MG tablet [id number] Yes Take 5 mg by mouth daily. [provider]  Active Self  Cholecalciferol  (VITAMIN D3) 50 MCG (2000 UT) TABS [id number] Yes Take 2,000 Units by mouth daily. [provider]  Active Self  clopidogrel  (PLAVIX ) 75 MG tablet [id number] Yes Take 1 tablet (75 mg total) by mouth daily with breakfast. McDaniel, Jill D, NP  Active Self  pantoprazole  (PROTONIX ) 40 MG tablet [id number] Yes Take 1 tablet by mouth once daily Court Dorn PARAS, MD  Active Self  Rosuvastatin  Calcium  20 MG CPSP [id number] Yes Take 20 mg by mouth daily. [provider]  Active Self  TRELEGY ELLIPTA 100-62.5-25 MCG/ACT AEPB [id number] Yes Inhale 1 puff into the lungs daily. [provider]  Active             Goals Addressed             This Visit's Progress    VBCI Transitions of Care (TOC) Care Plan       Problems:  Recent Hospitalization for treatment of COPD Knowledge Deficit Related to COPD and HTN   Goal:  Over the next 30 days, the patient will not experience hospital readmission  Interventions:  Transitions of Care: Doctor Visits  - discussed the importance of doctor visits Discussed importance of HTN medication adherence.  COPD Interventions: Advised patient to track and manage COPD triggers Advised patient to self assesses COPD action plan zone and  make appointment with provider if in the yellow zone for 48 hours without improvement Discussed the importance of adequate rest and management of fatigue with COPD Provided education about and advised patient to utilize infection prevention strategies to reduce risk of respiratory  infection Provided instruction about proper use of medications used for management of COPD including inhalers Reviewed the importance of quitting smoking- per patient no cigarettes since recent admission.      Patient Self Care Activities:  Call pharmacy for medication refills 3-7 days in advance of running out of medications Call provider office for new concerns or questions  Notify RN Care Manager of TOC call rescheduling needs Participate in Transition of Care Program/Attend TOC scheduled calls Take medications as prescribed   eliminate smoking in my home eliminate symptom triggers at home follow rescue plan if symptoms flare-up Take blood pressure medication as prescribed.    Plan:  The patient has been provided with contact information for the care management team and has been advised to call with any health related questions or concerns.         Recommendation:   Continue Current Plan of Care  Follow Up Plan:   Telephone follow-up in 1 week  Giavonna Pflum J. Orpah Hausner RN, MSN [facility] Health RN Care Manager Direct Dial: [phone]  Fax: [phone] Website: [email or web address]

## 2023-12-25 ENCOUNTER — Other Ambulatory Visit: Payer: Self-pay

## 2023-12-25 NOTE — Patient Instructions (Signed)
 Visit Information  Thank you for taking time to visit with me today. Please don't hesitate to contact me if I can be of assistance to you before our next scheduled telephone appointment.  Our next appointment is by telephone on 01/01/24 at 1100 am  Following is a copy of your care plan:   Goals Addressed             This Visit's Progress    VBCI Transitions of Care (TOC) Care Plan       Problems:  Recent Hospitalization for treatment of COPD Knowledge Deficit Related to COPD and HTN   Goal:  Over the next 30 days, the patient will not experience hospital readmission  Interventions:  Transitions of Care: Doctor Visits  - discussed the importance of doctor visits Discussed importance of HTN medication adherence.  COPD Interventions: Advised patient to track and manage COPD triggers Advised patient to self assesses COPD action plan zone and make appointment with provider if in the yellow zone for 48 hours without improvement Discussed the importance of adequate rest and management of fatigue with COPD Provided education about and advised patient to utilize infection prevention strategies to reduce risk of respiratory infection Provided instruction about proper use of medications used for management of COPD including inhalers Reviewed the importance of quitting smoking- per patient no cigarettes since recent admission.      Patient Self Care Activities:  Call pharmacy for medication refills 3-7 days in advance of running out of medications Call provider office for new concerns or questions  Notify RN Care Manager of TOC call rescheduling needs Participate in Transition of Care Program/Attend TOC scheduled calls Take medications as prescribed   eliminate smoking in my home eliminate symptom triggers at home follow rescue plan if symptoms flare-up Take blood pressure medication as prescribed.  Obtain blood pressure monitor  Plan:  The patient has been provided with contact  information for the care management team and has been advised to call with any health related questions or concerns.         Patient verbalizes understanding of instructions and care plan provided today and agrees to view in MyChart. Active MyChart status and patient understanding of how to access instructions and care plan via MyChart confirmed with patient.     The patient has been provided with contact information for the care management team and has been advised to call with any health related questions or concerns.   Please call the care guide team at [phone] if you need to cancel or reschedule your appointment.   Please call the Suicide and Crisis Lifeline: 988 if you are experiencing a Mental Health or Behavioral Health Crisis or need someone to talk to.  Breton Berns J. Tineshia Becraft RN, MSN [facility], [facility] Health RN Care Manager Direct Dial: [phone]  Fax: [phone] Website: [email or web address]

## 2023-12-25 NOTE — Transitions of Care (Post Inpatient/ED Visit) (Signed)
 Transition of Care week 4  Visit Note  12/25/2023  Name: Cynthia Hobbs MRN: [id number]          DOB: 1955-10-30  Situation: Patient enrolled in [facility] 30-day program. Visit completed with patient by telephone.   Background:   Initial Transition Care Management Follow-up Telephone Call    Past Medical History:  Diagnosis Date   GERD (gastroesophageal reflux disease)    Hypertension    PAD (peripheral artery disease)     Assessment: Patient Reported Symptoms: Cognitive Cognitive Status: Alert and oriented to person, place, and time, Normal speech and language skills      Neurological Neurological Review of Symptoms: No symptoms reported    HEENT HEENT Symptoms Reported: No symptoms reported      Cardiovascular Cardiovascular Symptoms Reported: No symptoms reported    Respiratory Respiratory Symptoms Reported: No symptoms reported Additional Respiratory Details: Hx. COPD.  Recent oxygen saturation 96% today.  Reviewed COPD. Respiratory Management Strategies: Routine screening, Medication therapy Respiratory Self-Management Outcome: 4 (good)  Endocrine Endocrine Symptoms Reported: No symptoms reported    Gastrointestinal Gastrointestinal Symptoms Reported: No symptoms reported      Genitourinary Genitourinary Symptoms Reported: No symptoms reported    Integumentary Integumentary Symptoms Reported: No symptoms reported    Musculoskeletal Musculoskelatal Symptoms Reviewed: No symptoms reported        Psychosocial Psychosocial Symptoms Reported: No symptoms reported         There were no vitals filed for this visit.  Medications Reviewed Today     Reviewed by Rease Wence, RN (Case Manager) on 12/25/23 at 1029  Med List Status: <None>   Medication Order Taking? Sig Documenting Provider Last Dose Status Informant  albuterol  (VENTOLIN  HFA) 108 (90 Base) MCG/ACT inhaler [id number] Yes Inhale 1 puff into the lungs every 6 (six) hours as needed for shortness of breath  or wheezing. [provider]  Active   amLODipine  (NORVASC ) 5 MG tablet [id number] Yes Take 5 mg by mouth daily. [provider]  Active Self  Cholecalciferol  (VITAMIN D3) 50 MCG (2000 UT) TABS [id number] Yes Take 2,000 Units by mouth daily. [provider]  Active Self  clopidogrel  (PLAVIX ) 75 MG tablet [id number] Yes Take 1 tablet (75 mg total) by mouth daily with breakfast. McDaniel, Jill D, NP  Active Self  pantoprazole  (PROTONIX ) 40 MG tablet [id number] Yes Take 1 tablet by mouth once daily Court Dorn PARAS, MD  Active Self  Rosuvastatin  Calcium  20 MG CPSP [id number] Yes Take 20 mg by mouth daily. [provider]  Active Self  TRELEGY ELLIPTA 100-62.5-25 MCG/ACT AEPB [id number] Yes Inhale 1 puff into the lungs daily. [provider]  Active             Goals Addressed             This Visit's Progress    VBCI Transitions of Care (TOC) Care Plan       Problems:  Recent Hospitalization for treatment of COPD Knowledge Deficit Related to COPD and HTN   Goal:  Over the next 30 days, the patient will not experience hospital readmission  Interventions:  Transitions of Care: Doctor Visits  - discussed the importance of doctor visits Discussed importance of HTN medication adherence.  COPD Interventions: Advised patient to track and manage COPD triggers Advised patient to self assesses COPD action plan zone and make appointment with provider if in the yellow zone for 48 hours without improvement Discussed the importance of adequate rest and management  of fatigue with COPD Provided education about and advised patient to utilize infection prevention strategies to reduce risk of respiratory infection Provided instruction about proper use of medications used for management of COPD including inhalers Reviewed the importance of quitting smoking- per patient no cigarettes since recent admission.      Patient Self Care Activities:  Call pharmacy for  medication refills 3-7 days in advance of running out of medications Call provider office for new concerns or questions  Notify RN Care Manager of TOC call rescheduling needs Participate in Transition of Care Program/Attend TOC scheduled calls Take medications as prescribed   eliminate smoking in my home eliminate symptom triggers at home follow rescue plan if symptoms flare-up Take blood pressure medication as prescribed.  Obtain blood pressure monitor  Plan:  The patient has been provided with contact information for the care management team and has been advised to call with any health related questions or concerns.         Recommendation:   Continue Current Plan of Care  Follow Up Plan:   Telephone follow-up in 1 week  Andora Krull J. Genavive Kubicki RN, MSN [facility], [facility] Health RN Care Manager Direct Dial: [phone]  Fax: [phone] Website: [email or web address]

## 2024-01-01 ENCOUNTER — Telehealth: Payer: Self-pay

## 2024-01-02 ENCOUNTER — Telehealth: Payer: Self-pay

## 2024-01-02 NOTE — Transitions of Care (Post Inpatient/ED Visit) (Signed)
 Transition of Care Week 5  Visit Note  01/02/2024  Name: Cynthia Hobbs MRN: [id number]          DOB: 25-Nov-1955  Situation: Patient enrolled in [facility] 30-day program. Visit completed with patient by telephone.   Background:   Initial Transition Care Management Follow-up Telephone Call Discharge Date and Diagnosis: 11/30/23, Acute exacerbation of chronic obstructive pulmonary disease   Past Medical History:  Diagnosis Date   GERD (gastroesophageal reflux disease)    Hypertension    PAD (peripheral artery disease)     Assessment: Patient Reported Symptoms: Cognitive Cognitive Status: Alert and oriented to person, place, and time, Normal speech and language skills      Neurological Neurological Review of Symptoms: No symptoms reported    HEENT HEENT Symptoms Reported: No symptoms reported      Cardiovascular Cardiovascular Symptoms Reported: No symptoms reported    Respiratory Respiratory Symptoms Reported: No symptoms reported    Endocrine Endocrine Symptoms Reported: No symptoms reported    Gastrointestinal Gastrointestinal Symptoms Reported: No symptoms reported      Genitourinary Genitourinary Symptoms Reported: No symptoms reported    Integumentary Integumentary Symptoms Reported: No symptoms reported    Musculoskeletal Musculoskelatal Symptoms Reviewed: No symptoms reported        Psychosocial Psychosocial Symptoms Reported: No symptoms reported         There were no vitals filed for this visit.  Medications Reviewed Today     Reviewed by Gabrian Hoque, RN (Case Manager) on 01/02/24 at 1206  Med List Status: <None>   Medication Order Taking? Sig Documenting Provider Last Dose Status Informant  albuterol  (VENTOLIN  HFA) 108 (90 Base) MCG/ACT inhaler [id number] Yes Inhale 1 puff into the lungs every 6 (six) hours as needed for shortness of breath or wheezing. [provider]  Active   amLODipine  (NORVASC ) 5 MG tablet [id number] Yes Take 5 mg by mouth  daily. [provider]  Active Self  Cholecalciferol  (VITAMIN D3) 50 MCG (2000 UT) TABS [id number] Yes Take 2,000 Units by mouth daily. [provider]  Active Self  clopidogrel  (PLAVIX ) 75 MG tablet [id number] Yes Take 1 tablet (75 mg total) by mouth daily with breakfast. McDaniel, Jill D, NP  Active Self  pantoprazole  (PROTONIX ) 40 MG tablet [id number] Yes Take 1 tablet by mouth once daily Court Dorn PARAS, MD  Active Self  Rosuvastatin  Calcium  20 MG CPSP [id number] Yes Take 20 mg by mouth daily. [provider]  Active Self  TRELEGY ELLIPTA 100-62.5-25 MCG/ACT AEPB [id number] Yes Inhale 1 puff into the lungs daily. [provider]  Active             Goals Addressed             This Visit's Progress    COMPLETED: [facility] Transitions of Care (TOC) Care Plan       Problems:  Recent Hospitalization for treatment of COPD Knowledge Deficit Related to COPD and HTN   Goal:  Over the next 30 days, the patient will not experience hospital readmission  Interventions: Patient has completes TOC program. Goals met Transitions of Care: Doctor Visits  - discussed the importance of doctor visits Discussed importance of HTN medication adherence.  COPD Interventions: Advised patient to track and manage COPD triggers Advised patient to self assesses COPD action plan zone and make appointment with provider if in the yellow zone for 48 hours without improvement Discussed the importance of adequate rest and management of fatigue with COPD Provided education about  and advised patient to utilize infection prevention strategies to reduce risk of respiratory infection Provided instruction about proper use of medications used for management of COPD including inhalers Reviewed the importance of quitting smoking- 10/15 /25 patient reports no cigarettes since admission.      Patient Self Care Activities:  Call pharmacy for medication refills 3-7 days in advance of running out  of medications Call provider office for new concerns or questions  Notify RN Care Manager of TOC call rescheduling needs Participate in Transition of Care Program/Attend TOC scheduled calls Take medications as prescribed   eliminate smoking in my home eliminate symptom triggers at home follow rescue plan if symptoms flare-up Take blood pressure medication as prescribed.  Obtain blood pressure monitor  Plan:  The patient has been provided with contact information for the care management team and has been advised to call with any health related questions or concerns.         Recommendation:   Continue Current Plan of Care Goals Met: closing from [facility] program  Follow Up Plan:   Closing From:  Transitions of Care Program  Kaseem Vastine DOROTHA Seeds RN, MSN [place]  [facility], [facility] RN Care Manager Direct Dial: [phone]  Fax: [phone] Website: [email or web address]

## 2024-01-02 NOTE — Patient Instructions (Signed)
 Visit Information  Thank you for taking time to visit with me today.   Following is a copy of your care plan:   Goals Addressed             This Visit's Progress    COMPLETED: [facility] Transitions of Care (TOC) Care Plan       Problems:  Recent Hospitalization for treatment of COPD Knowledge Deficit Related to COPD and HTN   Goal:  Over the next 30 days, the patient will not experience hospital readmission  Interventions: Patient has completes TOC program. Goals met Transitions of Care: Doctor Visits  - discussed the importance of doctor visits Discussed importance of HTN medication adherence.  COPD Interventions: Advised patient to track and manage COPD triggers Advised patient to self assesses COPD action plan zone and make appointment with provider if in the yellow zone for 48 hours without improvement Discussed the importance of adequate rest and management of fatigue with COPD Provided education about and advised patient to utilize infection prevention strategies to reduce risk of respiratory infection Provided instruction about proper use of medications used for management of COPD including inhalers Reviewed the importance of quitting smoking- 10/15 /25 patient reports no cigarettes since admission.      Patient Self Care Activities:  Call pharmacy for medication refills 3-7 days in advance of running out of medications Call provider office for new concerns or questions  Notify RN Care Manager of TOC call rescheduling needs Participate in Transition of Care Program/Attend TOC scheduled calls Take medications as prescribed   eliminate smoking in my home eliminate symptom triggers at home follow rescue plan if symptoms flare-up Take blood pressure medication as prescribed.  Obtain blood pressure monitor  Plan:  The patient has been provided with contact information for the care management team and has been advised to call with any health related questions or concerns.          Patient verbalizes understanding of instructions and care plan provided today and agrees to view in MyChart. Active MyChart status and patient understanding of how to access instructions and care plan via MyChart confirmed with patient.     The patient has been provided with contact information for the care management team and has been advised to call with any health related questions or concerns.   Please call the care guide team at [phone] if you need to cancel or reschedule your appointment.   Please call the Suicide and Crisis Lifeline: 988 if you are experiencing a Mental Health or Behavioral Health Crisis or need someone to talk to.  Nesreen Albano J. Honestee Revard RN, MSN [facility], [facility] RN Care Manager Direct Dial: [phone]  Fax: [phone] Website: [email or web address]

## 2024-02-07 DIAGNOSIS — M545 Low back pain, unspecified: Secondary | ICD-10-CM | POA: Diagnosis not present

## 2024-02-07 DIAGNOSIS — Z7901 Long term (current) use of anticoagulants: Secondary | ICD-10-CM | POA: Diagnosis not present

## 2024-03-24 LAB — LAB REPORT - SCANNED: EGFR: 75

## 2024-04-03 ENCOUNTER — Ambulatory Visit: Payer: Self-pay | Admitting: Cardiovascular Disease
